# Patient Record
Sex: Male | Born: 1935 | Race: White | Hispanic: No | Smoking: Former smoker
Health system: Southern US, Community
[De-identification: ages and names within clinical notes are randomized; demographics above are authoritative.]

## PROBLEM LIST (undated history)

## (undated) DIAGNOSIS — Z8709 Personal history of other diseases of the respiratory system: Secondary | ICD-10-CM

## (undated) DIAGNOSIS — K625 Hemorrhage of anus and rectum: Secondary | ICD-10-CM

## (undated) DIAGNOSIS — E039 Hypothyroidism, unspecified: Secondary | ICD-10-CM

## (undated) DIAGNOSIS — G459 Transient cerebral ischemic attack, unspecified: Secondary | ICD-10-CM

## (undated) DIAGNOSIS — G5600 Carpal tunnel syndrome, unspecified upper limb: Secondary | ICD-10-CM

## (undated) DIAGNOSIS — F32A Depression, unspecified: Secondary | ICD-10-CM

## (undated) DIAGNOSIS — K4090 Unilateral inguinal hernia, without obstruction or gangrene, not specified as recurrent: Secondary | ICD-10-CM

## (undated) DIAGNOSIS — I2510 Atherosclerotic heart disease of native coronary artery without angina pectoris: Secondary | ICD-10-CM

## (undated) DIAGNOSIS — I10 Essential (primary) hypertension: Secondary | ICD-10-CM

## (undated) DIAGNOSIS — K219 Gastro-esophageal reflux disease without esophagitis: Secondary | ICD-10-CM

## (undated) DIAGNOSIS — F0390 Unspecified dementia without behavioral disturbance: Secondary | ICD-10-CM

## (undated) DIAGNOSIS — E783 Hyperchylomicronemia: Secondary | ICD-10-CM

## (undated) DIAGNOSIS — J189 Pneumonia, unspecified organism: Secondary | ICD-10-CM

## (undated) DIAGNOSIS — Z8719 Personal history of other diseases of the digestive system: Secondary | ICD-10-CM

## (undated) DIAGNOSIS — M1990 Unspecified osteoarthritis, unspecified site: Secondary | ICD-10-CM

## (undated) DIAGNOSIS — G5793 Unspecified mononeuropathy of bilateral lower limbs: Secondary | ICD-10-CM

## (undated) DIAGNOSIS — IMO0001 Reserved for inherently not codable concepts without codable children: Secondary | ICD-10-CM

## (undated) DIAGNOSIS — F329 Major depressive disorder, single episode, unspecified: Secondary | ICD-10-CM

## (undated) DIAGNOSIS — D649 Anemia, unspecified: Secondary | ICD-10-CM

## (undated) DIAGNOSIS — N289 Disorder of kidney and ureter, unspecified: Secondary | ICD-10-CM

## (undated) HISTORY — DX: Hyperchylomicronemia: E78.3

## (undated) HISTORY — DX: Gastro-esophageal reflux disease without esophagitis: K21.9

## (undated) HISTORY — DX: Carpal tunnel syndrome, unspecified upper limb: G56.00

## (undated) HISTORY — DX: Essential (primary) hypertension: I10

## (undated) HISTORY — DX: Anemia, unspecified: D64.9

## (undated) HISTORY — PX: CORONARY ANGIOPLASTY: SHX604

## (undated) HISTORY — PX: COLONOSCOPY: SHX174

## (undated) HISTORY — DX: Atherosclerotic heart disease of native coronary artery without angina pectoris: I25.10

## (undated) HISTORY — DX: Hemorrhage of anus and rectum: K62.5

## (undated) HISTORY — DX: Disorder of kidney and ureter, unspecified: N28.9

## (undated) HISTORY — PX: UPPER GASTROINTESTINAL ENDOSCOPY: SHX188

## (undated) HISTORY — PX: OTHER SURGICAL HISTORY: SHX169

---

## 1963-01-22 HISTORY — PX: OTHER SURGICAL HISTORY: SHX169

## 1997-09-29 ENCOUNTER — Ambulatory Visit (HOSPITAL_COMMUNITY): Admission: RE | Admit: 1997-09-29 | Discharge: 1997-09-29 | Payer: Self-pay | Admitting: Pulmonary Disease

## 1998-05-15 ENCOUNTER — Encounter: Payer: Self-pay | Admitting: Neurological Surgery

## 1998-05-15 ENCOUNTER — Ambulatory Visit (HOSPITAL_COMMUNITY): Admission: RE | Admit: 1998-05-15 | Discharge: 1998-05-15 | Payer: Self-pay | Admitting: Neurological Surgery

## 1999-08-16 ENCOUNTER — Ambulatory Visit (HOSPITAL_COMMUNITY): Admission: RE | Admit: 1999-08-16 | Discharge: 1999-08-16 | Payer: Self-pay | Admitting: Urology

## 2002-03-19 ENCOUNTER — Ambulatory Visit (HOSPITAL_COMMUNITY): Admission: RE | Admit: 2002-03-19 | Discharge: 2002-03-19 | Payer: Self-pay | Admitting: Internal Medicine

## 2003-03-18 ENCOUNTER — Ambulatory Visit (HOSPITAL_COMMUNITY): Admission: RE | Admit: 2003-03-18 | Discharge: 2003-03-18 | Payer: Self-pay | Admitting: Internal Medicine

## 2004-01-22 HISTORY — PX: OTHER SURGICAL HISTORY: SHX169

## 2004-06-19 ENCOUNTER — Inpatient Hospital Stay (HOSPITAL_COMMUNITY): Admission: EM | Admit: 2004-06-19 | Discharge: 2004-06-20 | Payer: Self-pay | Admitting: Emergency Medicine

## 2004-06-19 ENCOUNTER — Ambulatory Visit: Payer: Self-pay | Admitting: Cardiology

## 2004-06-20 ENCOUNTER — Encounter (INDEPENDENT_AMBULATORY_CARE_PROVIDER_SITE_OTHER): Payer: Self-pay | Admitting: *Deleted

## 2004-07-05 ENCOUNTER — Ambulatory Visit: Payer: Self-pay | Admitting: Internal Medicine

## 2004-08-16 ENCOUNTER — Encounter (HOSPITAL_COMMUNITY): Admission: RE | Admit: 2004-08-16 | Discharge: 2004-11-14 | Payer: Self-pay | Admitting: Cardiology

## 2004-09-17 ENCOUNTER — Ambulatory Visit: Payer: Self-pay | Admitting: Cardiology

## 2004-09-28 ENCOUNTER — Emergency Department (HOSPITAL_COMMUNITY): Admission: EM | Admit: 2004-09-28 | Discharge: 2004-09-28 | Payer: Self-pay | Admitting: Emergency Medicine

## 2004-11-15 ENCOUNTER — Encounter (HOSPITAL_COMMUNITY): Admission: RE | Admit: 2004-11-15 | Discharge: 2004-12-20 | Payer: Self-pay | Admitting: Cardiology

## 2005-05-23 ENCOUNTER — Ambulatory Visit: Payer: Self-pay | Admitting: Cardiology

## 2006-04-03 ENCOUNTER — Ambulatory Visit: Payer: Self-pay | Admitting: Cardiology

## 2006-04-10 ENCOUNTER — Ambulatory Visit: Payer: Self-pay

## 2006-04-14 ENCOUNTER — Ambulatory Visit: Payer: Self-pay

## 2006-04-14 ENCOUNTER — Encounter: Payer: Self-pay | Admitting: Internal Medicine

## 2006-04-30 ENCOUNTER — Ambulatory Visit: Payer: Self-pay | Admitting: Cardiology

## 2006-10-28 ENCOUNTER — Encounter (HOSPITAL_COMMUNITY): Admission: RE | Admit: 2006-10-28 | Discharge: 2007-01-20 | Payer: Self-pay | Admitting: Internal Medicine

## 2007-06-02 ENCOUNTER — Ambulatory Visit: Payer: Self-pay | Admitting: Internal Medicine

## 2007-11-25 DIAGNOSIS — K219 Gastro-esophageal reflux disease without esophagitis: Secondary | ICD-10-CM | POA: Insufficient documentation

## 2007-11-25 DIAGNOSIS — I2510 Atherosclerotic heart disease of native coronary artery without angina pectoris: Secondary | ICD-10-CM | POA: Insufficient documentation

## 2007-11-25 DIAGNOSIS — R05 Cough: Secondary | ICD-10-CM

## 2007-11-25 DIAGNOSIS — G5600 Carpal tunnel syndrome, unspecified upper limb: Secondary | ICD-10-CM

## 2007-11-25 DIAGNOSIS — I10 Essential (primary) hypertension: Secondary | ICD-10-CM | POA: Insufficient documentation

## 2007-11-25 DIAGNOSIS — E785 Hyperlipidemia, unspecified: Secondary | ICD-10-CM | POA: Insufficient documentation

## 2007-11-25 DIAGNOSIS — R059 Cough, unspecified: Secondary | ICD-10-CM | POA: Insufficient documentation

## 2007-11-30 ENCOUNTER — Ambulatory Visit: Payer: Self-pay | Admitting: Internal Medicine

## 2007-11-30 DIAGNOSIS — R142 Eructation: Secondary | ICD-10-CM

## 2007-11-30 DIAGNOSIS — R195 Other fecal abnormalities: Secondary | ICD-10-CM | POA: Insufficient documentation

## 2007-11-30 DIAGNOSIS — K625 Hemorrhage of anus and rectum: Secondary | ICD-10-CM

## 2007-11-30 DIAGNOSIS — R143 Flatulence: Secondary | ICD-10-CM

## 2007-11-30 DIAGNOSIS — R141 Gas pain: Secondary | ICD-10-CM | POA: Insufficient documentation

## 2007-11-30 DIAGNOSIS — D649 Anemia, unspecified: Secondary | ICD-10-CM | POA: Insufficient documentation

## 2007-12-02 LAB — CONVERTED CEMR LAB
Ferritin: 55.9 ng/mL (ref 22.0–322.0)
Folate: 18.5 ng/mL
Iron: 132 ug/dL (ref 42–165)
Saturation Ratios: 29.5 % (ref 20.0–50.0)
Transferrin: 319.9 mg/dL (ref >=212.0)
Vitamin B-12: 483 pg/mL (ref 211–911)

## 2007-12-16 ENCOUNTER — Ambulatory Visit: Payer: Self-pay | Admitting: Internal Medicine

## 2007-12-22 ENCOUNTER — Ambulatory Visit: Payer: Self-pay | Admitting: Internal Medicine

## 2007-12-22 ENCOUNTER — Inpatient Hospital Stay (HOSPITAL_BASED_OUTPATIENT_CLINIC_OR_DEPARTMENT_OTHER): Admission: RE | Admit: 2007-12-22 | Discharge: 2007-12-22 | Payer: Self-pay | Admitting: Internal Medicine

## 2008-01-13 ENCOUNTER — Ambulatory Visit: Payer: Self-pay | Admitting: Internal Medicine

## 2008-04-19 DIAGNOSIS — N289 Disorder of kidney and ureter, unspecified: Secondary | ICD-10-CM

## 2008-09-13 ENCOUNTER — Ambulatory Visit: Payer: Self-pay | Admitting: Cardiovascular Disease

## 2008-09-13 ENCOUNTER — Observation Stay: Payer: Federal, State, Local not specified - PPO | Admitting: *Deleted

## 2008-09-14 ENCOUNTER — Encounter: Payer: Self-pay | Admitting: Cardiovascular Disease

## 2008-09-21 ENCOUNTER — Ambulatory Visit: Payer: Self-pay | Admitting: Internal Medicine

## 2008-09-21 DIAGNOSIS — I2510 Atherosclerotic heart disease of native coronary artery without angina pectoris: Secondary | ICD-10-CM | POA: Insufficient documentation

## 2008-10-20 ENCOUNTER — Ambulatory Visit: Payer: Self-pay | Admitting: Internal Medicine

## 2009-04-17 ENCOUNTER — Ambulatory Visit: Payer: Self-pay | Admitting: Internal Medicine

## 2009-04-17 DIAGNOSIS — F329 Major depressive disorder, single episode, unspecified: Secondary | ICD-10-CM

## 2010-01-09 DIAGNOSIS — G4700 Insomnia, unspecified: Secondary | ICD-10-CM | POA: Insufficient documentation

## 2010-01-09 DIAGNOSIS — F419 Anxiety disorder, unspecified: Secondary | ICD-10-CM | POA: Insufficient documentation

## 2010-02-20 NOTE — Assessment & Plan Note (Signed)
Summary: F6M/AMD  Medications Added ASPIRIN 81 MG TBEC (ASPIRIN) Take one tablet by mouth daily NITROSTAT 0.4 MG SUBL (NITROGLYCERIN) 1 tablet under tongue at onset of chest pain; you may repeat every 5 minutes for up to 3 doses. * VITAMIN D-3 take one tablet daily MELATONIN 300 MCG TABS (MELATONIN) take one tablet daily CYMBALTA 30 MG CPEP (DULOXETINE HCL) one by mouth qd      Allergies Added:   Visit Type:  Follow-up Primary Provider:  [name], MD  CC:  no complaints.  History of Present Illness: Shawn Sanchez is a very pleasant 77 year old former [employer] with a history of coronary artery disease.  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  He underwent placement of Cypher drug-eluting stent to the distal right coronary artery and the mid LAD.  At that time, ejection fraction was normal. Cath 12/09 with nonobstructive CAD and patent stents.  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  Doing well from a cardiac standpoint but struggling with recurrent depression. NO CP or SOB. Recently had teeth pulled and struggling with his lower dentures.  Recent lipids with LDL 57 and HDL 40. Previously tried Paxil at [place] and he didn't like because it made him feel fatigued. Wants something more activating.   Current Medications (verified): 1)  Plavix 75 Mg Tabs (Clopidogrel Bisulfate) .Marland Kitchen.. 1 Tablet By Mouth Once Daily 2)  Aspirin Ec 81 Mg Tbec (Aspirin) .Marland Kitchen.. 1 Tablet By Mouth Once Daily 3)  Carvedilol 12.5 Mg Tabs (Carvedilol) .... Take One Tablet By Mouth Twice A Day 4)  Tricor 145 Mg Tabs (Fenofibrate) .Marland Kitchen.. 1 Tablet By Mouth Once Daily 5)  Simvastatin 20 Mg Tabs (Simvastatin) .Marland Kitchen.. 1 Tablet By Mouth Once Daily 6)  Imdur 60 Mg Xr24h-Tab (Isosorbide Mononitrate) .Marland Kitchen.. 1 Tablet By Mouth Once Daily 7)  Naproxen 500 Mg Tabs (Naproxen) .... As Needed 8)  Aspirin 81 Mg Tbec (Aspirin) .... Take One Tablet By Mouth Daily 9)  Nitrostat 0.4 Mg  Subl (Nitroglycerin) .Marland Kitchen.. 1 Tablet Under Tongue At Onset of Chest Pain; You May Repeat Every 5 Minutes For Up To 3 Doses. 10)  Vitamin D-3 .... Take One Tablet Daily 11)  Melatonin 300 Mcg Tabs (Melatonin) .... Take One Tablet Daily  Allergies (verified): 1)  ! * Isocaine 2)  ! * Mepivacaine 3)  ! Allene Pyo  Past History:  Past Medical History: Reviewed history from 04/19/2008 and no changes required. RENAL DISEASE, CHRONIC (ICD-593.9) ANEMIA-UNSPECIFIED (ICD-285.9) FLATULENCE (ICD-787.3) RECTAL BLEEDING (ICD-569.3) FECAL OCCULT BLOOD (ICD-792.1) HYPERTENSION (ICD-401.9) COUGH, CHRONIC (ICD-786.2) CARPAL TUNNEL SYNDROME (ICD-354.0) CAD (ICD-414.00) HYPERLIPIDEMIA (ICD-272.4) GERD (ICD-530.81)    Review of Systems       As per HPI and past medical history; otherwise all systems negative.   Vital Signs:  Patient profile:   77 year old male Height:      68 inches Weight:      172.8 pounds Pulse rate:   64 / minute BP sitting:   114 / 68  (left arm) Cuff size:   regular  Vitals Entered By: Burnett Kanaris, CNA (April 17, 2009 11:12 AM)  Physical Exam  General:  GENERAL:  He is no acute distress, ambulates around the clinic without any respiratory difficulty. HEENT:  Normal except for poor dentition. NECK:  Supple.  No JVD.  Carotids are 2+ bilaterally without any bruits. There is no lymphadenopathy or thyromegaly. CARDIAC:  PMI is nondisplaced.  He has distant heart  sounds.  He is regular with an S4.  No murmur. LUNGS:  Clear. ABDOMEN:  Soft, nontender, nondistended.  No hepatosplenomegaly, no bruits, no masses.  Good bowel sounds.  EXTREMITIES:  Warm with no cyanosis, clubbing, or edema.  No rash. NEURO:  Alert and x3.  Cranial nerves II through XII are intact.  Moves all 4 extremities without difficulty.    Impression & Recommendations:  Problem # 1:  CAD, NATIVE VESSEL (ICD-414.01) Stable. No evidence of ischemia. Continue current regimen.  Problem # 2:   HYPERTENSION (ICD-401.9) Blood pressure well controlled. Continue current regimen.  Problem # 3:  DEPRESSION (ICD-311) Spoke with Dr. Timothy Lasso who recommended Cymbalta 30mg . Will give him prescription and he will f/u with Dr. Timothy Lasso in 2-4 weeks.  Problem # 4:  HYPERLIPIDEMIA (ICD-272.4) Followed by Dr. Timothy Lasso. Lipids look great. Continue current regimen.   Patient Instructions: 1)  Your physician recommends that you schedule a follow-up appointment in: 6 months with Dr Gala Romney 2)  Your physician has recommended you make the following change in your medication: start Cymbalta 30mg  daily

## 2010-05-18 ENCOUNTER — Other Ambulatory Visit: Payer: Self-pay | Admitting: Internal Medicine

## 2010-06-05 NOTE — Assessment & Plan Note (Signed)
[facility]                            CARDIOLOGY OFFICE NOTE   TOBIAH, CELESTINE                     MRN:          [id number]  DATE:06/02/2007                            DOB:          1931/11/28    PRIMARY CARE PHYSICIAN:  Gwen Pounds, M.D.   INTERVAL HISTORY:  Mr. Rumery is a 77 year old man who has a history  of coronary artery disease.  He is status post aborted inferior  myocardial infarction in May of 2006.  He underwent placement of Cypher  drug-eluting stent to his distal right coronary artery and mid LAD back  at that time by Dr. Samule Ohm.  His EF was normal.   He also has a history of hypertension and hyperlipidemia.  He has been  unwilling to take statins, as he has felt it makes him feel weak and has  problems with his memory.  He notes that over the past year when he  exerts himself, when he is walking up a hill, he develops chest pain.  This resolves as soon as he starts going back on flat ground again.  He  has not had any resting symptoms.  He feels like it has been a little  bit more frequent over the past few months, but he has not had any  severe episodes.  He does not take nitroglycerin, as he says the  intensity has never been bad enough.   CURRENT MEDICATIONS:  1. Aspirin 81.  2. Plavix 75.  3. Coreg 12.5 daily.  He is unable to take it b.i.d.  4. Tricor 145 a day.   PHYSICAL EXAMINATION:  GENERAL:  He is well-appearing, in no acute  distress.  He ambulates around the clinic without any respiratory  difficulty.  VITAL SIGNS:  Blood pressure is 110/70, heart rate 58, weights 180.  HEENT:  Normal.  NECK:  Supple.  No JVD.  Carotids are 2+ bilateral without bruits.  There is no lymphadenopathy or thyromegaly.  CARDIAC:  PMI is nondisplaced.  Regular rate and bradycardic.  No  murmurs, rubs or gallops.  No murmurs, rubs or gallops.  LUNGS:  Clear.  ABDOMEN:  Soft, nontender, nondistended.  No hepatosplenomegaly, no  bruits.  Good bowel sounds.  EXTREMITIES:  Warm with no cyanosis, clubbing or edema.  No rash.  NEUROLOGIC:  Alert and oriented x3.  Cranial nerves II-XII are intact.  Moves all four extremities without difficulty.  Affect is pleasant.   EKG shows sinus bradycardia at a rate of 58, no ST-T wave abnormalities.   LABORATORY DATA:  Lipid panel shows total cholesterol of 188,  triglycerides 118, LDL of 118, and HDL 46.   ASSESSMENT AND PLAN:  1. Coronary artery disease.  He is status post previous stenting.      Symptoms are very consistent with chronic stable angina.  We      reviewed the options including medical therapy or repeat      catheterization.  At this point, he would like to pursue medical      therapy.  I have suggested cardiac rehab, but he would like  to work      out at the exercise facility near [place] [employer] [place].  At told him      this is okay as long as he is not pushing too hard with chest pain.      Will start him on Imdur 30 mg a day.  2. Hyperlipidemia.  This is followed by Dr. Timothy Lasso.  I had a long talk      with Mr. Benincasa about the need to get his LDL under 70 and the      importance of statins with a 30-40% reduction in recurrent cardiac      events.  He has agreed to try simvastatin 20 mg a day.  3. Hypertension, well-controlled.   DISPOSITION:  I will see him back in 2-3 months for followup.  I told  him that if he continuing to have significant chest pain, we can we  rediscuss the issue of catheterization at that time.  He knows to call  me if there is a major problem.  He does have p.r.n. nitroglycerin.     Bevelyn Buckles. Bensimhon, MD  Electronically Signed    DRB/MedQ  DD: 06/02/2007  DT: 06/02/2007  Job #: [id number]   cc:   Gwen Pounds, MD

## 2010-06-05 NOTE — Assessment & Plan Note (Signed)
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  He is status  post aborted inferior wall myocardial infarction in May 2006.  He  underwent placement of Cypher drug-eluting stent to the distal right  coronary artery and the mid LAD.  At that time, ejection fraction was  normal.  He also has a history of hypertension and hyperlipidemia,  though he has been unwilling to take statins due to side effects.   I saw him last month and he was having progressive shortness of breath  and neck pain which was his anginal equivalent.  We proceeded with  cardiac catheterization.  This showed nonobstructive coronary artery  disease with patent stents.  His EDP was only 6.  His ejection fraction  was normal.  He returns today for followup.  Overall, he says he is  feeling a bit better.  He still has some shortness of breath, but no  further neck pain.   CURRENT MEDICATIONS:  1. Aspirin 81.  2. Plavix 75 a day.  3. Policosanol.  4. TriCor 145 a day.  5. Carvedilol 6.250 b.i.d.  6. Imdur 30 a day.  7. Simvastatin 20 a day.  8. Synthroid 50 mcg a day.   PHYSICAL EXAMINATION:  GENERAL:  He is no acute distress, ambulates  around the clinic without any respiratory difficulty.  VITAL SIGNS:  Blood pressure is 110/62, heart rate 75, weight is 178.  HEENT:  Normal except for poor dentition.  NECK:  Supple.  No JVD.  Carotids are 2+ bilaterally without any bruits.  There is no lymphadenopathy or thyromegaly.  CARDIAC:  PMI is nondisplaced.  He has distant heart sounds.  He is  regular with an S4.  No murmur.  LUNGS:  Clear.  ABDOMEN:  Soft,  nontender, nondistended.  No hepatosplenomegaly, no  bruits, no masses.  Good bowel sounds.  EXTREMITIES:  Warm with no  cyanosis, clubbing, or edema.  No rash.  NEURO:  Alert and x3.  Cranial nerves II through XII are intact.  Moves  all 4 extremities without difficulty.   ASSESSMENT AND PLAN:  1. Coronary artery disease.  Catheterization is quite reassuring.  We      will continue medical therapy.  I did tell that he could      discontinue his Plavix for his colonoscopy or any possible dental      procedure he has coming up.  I told him that there was a small risk      of late stent thrombosis after coming off of Plavix, but this      should be quite small.  2. Hypertension well-controlled.  3. Hyperlipidemia.  He is finally agreed to take a statin.  Goal LDL  is less than 70.  This is followed by Dr. Timothy Lasso.  4. Deconditioning.  I suggested that he participate in a routine      exercise program.  We have given him a note that he can use the      exercise facility [place].     Bevelyn Buckles. Bensimhon, MD  Electronically Signed    DRB/MedQ  DD: 01/13/2008  DT: 01/13/2008  Job #: [id number]

## 2010-06-05 NOTE — Assessment & Plan Note (Signed)
[facility]                            CARDIOLOGY OFFICE NOTE   GER, RINGENBERG                     MRN:          [id number]  DATE:12/16/2007                            DOB:          May 19, 1931    PRIMARY CARE PHYSICIAN:  Shawn Pounds, MD   INTERVAL HISTORY:  Shawn Sanchez is a delightful 77 year old male with a history  of coronary artery disease.  He has status post aborted inferior wall  myocardial infarction in May 2006.  He underwent placement of Cypher  drug-eluting stent to his distal right coronary artery and the mid LAD  back at that time.  His ejection fraction was normal.  He also has a  history of hypertension and hyperlipidemia.  He has been unwilling to  take statins due to making him feel weak and he worries that they causes  problems with his memory.  He has been having some problems with just  minimal rectal bleeding, which he thinks it is due to fissures.  He did  see Dr. Yancey Flemings, GI, who is considering endoscopy, but they wanted  him to have a cardiac workup first.   At his last visit back in May, he was having symptoms that were  concerned for exertional angina.  He states these has now gotten worse.  He develops shortness of breath and neck pain with almost any activity  particularly up steps or whenever he pushes himself.  It has become more  frequent.  Last week, he did have an episode, where he developed neck  pain at rest.  He took 1 nitroglycerin and resolved spontaneously.   CURRENT MEDICATIONS:  1. Aspirin 81 a day.  2. Plavix 75 a day.  3. TriCor 145 a day.  4. Carvedilol 6.25 b.i.d.  5. Imdur 30 a day.  6. Simvastatin 20 a day.  7. Synthroid 50 mcg a day.   PHYSICAL EXAMINATION:  GENERAL:  He is well-appearing in no acute  distress, ambulates around the clinic without any respiratory  difficulty.  VITAL SIGNS:  Blood pressure is 112/68, heart rate 58, and weight is  179.  HEENT:  Normal.  NECK:  Supple.  No  JVD.  Carotids are 2+ bilaterally without any bruits.  There is no lymphadenopathy or thyromegaly.  CARDIAC:  PMI is nondisplaced.  Regular rate and rhythm.  No murmurs,  rubs, or gallops.  LUNGS:  Clear.  ABDOMEN:  Soft, nontender, and nondistended.  No hepatosplenomegaly.  No  bruits.  No masses.  Good bowel sounds.  EXTREMITIES:  Warm with no cyanosis, clubbing, or edema.  No rash.  NEURO:  Alert and oriented x3.  Cranial nerves II through XII are  intact.  Moves all 4 extremities without difficulty.  Affect is  pleasant.   EKG shows sinus bradycardia at a rate of 58.  No ST-T wave  abnormalities.   ASSESSMENT AND PLAN:  1. Coronary artery disease.  He is having symptoms of very concerning      for progressive angina.  We will plan a cardiac catheterization      next week in  the outpatient lab.  We will continue his Plavix.  I      did tell should he have a recurrent pain, which is refractory to a      single nitroglycerin, he should call 911 and be admitted to the      hospital for evaluation.  He understands this.  We will proceed      with catheterization next week.  2. Hypertension.  Blood pressure is well controlled.  3. Hyperlipidemia.  Lipids are followed by Dr. Timothy Lasso.  Goal LDL is      less than 70.     Bevelyn Buckles. Bensimhon, MD  Electronically Signed    DRB/MedQ  DD: 12/16/2007  DT: 12/16/2007  Job #: [id number]   cc:   Shawn Pounds, MD

## 2010-06-05 NOTE — Cardiovascular Report (Signed)
NAMEEVERARD, INTERRANTE              ACCOUNT NO.:  [id number]   MEDICAL RECORD NO.:  [id number]          PATIENT TYPE:  OIB   LOCATION:  1963                         FACILITY:  [facility]   PHYSICIAN:  Bevelyn Buckles. Bensimhon, MDDATE OF BIRTH:  [birth year]   DATE OF PROCEDURE:  12/22/2007  DATE OF DISCHARGE:                            CARDIAC CATHETERIZATION   PRIMARY CARE PHYSICIAN:  Gwen Pounds, MD   INTERVAL HISTORY:  Mr. Mossberg is a very pleasant 77 year old male with  history of coronary artery disease status post drug-eluting stent  placement to the distal right coronary artery and the mid LAD in May  2006.  He presented to the office with progressive chest pain concerning  for progressive angina.  He is brought today for diagnostic angiography.   PROCEDURES PERFORMED:  1. Selective coronary angiography.  2. Left heart catheterization.  3. Left ventriculogram.   DESCRIPTION OF PROCEDURE:  The risks and indication of the  catheterization were explained.  Consent was signed and placed on the  chart.  A 4-French arterial sheath was placed in the right femoral  artery using a modified Seldinger technique.  Standard catheters  including a JL-4, 3DRC and angled pigtail were used for the procedure.  All catheter exchanges were made over wire.  There were no apparent  complications.   Central aortic pressure 102/51 with a mean of 73.  LV pressure 101/9  with EDP of 6.  There was no aortic stenosis.   Left main was normal.   LAD was a long vessel coursing to the apex that gave off a single large  diagonal branch in the proximal section.  In the midsection, there was a  previously placed stent.  Prior to the stent, there was a 40-50% tubular  lesion and the stent was widely patent.   Left circumflex gave off a large ramus branch, small OM-1 and a moderate-  sized posterolateral branch.  There was a 50-60% lesion in the  midsection of the ramus branch.  This was non-flow limiting.   Right coronary artery was a large dominant vessel gave off a PDA and two  posterolaterals.  There was a 40% tubular lesion in the proximal RCA and  30% mid-lesion.  There was a long stent in the distal section which was  widely patent.  There was minor luminal irregularities distally.   Left ventriculogram in the RAO position showed an EF of 60-65% with no  regional wall motion abnormalities.  On panning down over the abdominal  aorta, there was no evidence of abdominal aortic aneurysm.   ASSESSMENT:  1. Nonobstructive coronary artery disease.  2. Patent right coronary artery and left anterior descending stents.  3. Normal left ventricular function.   PLAN/DISCUSSION:  I am unclear to the etiology of Mr. Zeck chest  pain.  Based on his angiography, it does not appear to be significant  myocardial ischemia.  We will check a D-dimer.  We will continue medical  therapy at this point.  If pain persists, we may consider GI workup.      Bevelyn Buckles. Bensimhon, MD  Electronically Signed  DRB/MEDQ  D:  12/22/2007  T:  12/22/2007  Job:  [id number]

## 2010-06-08 NOTE — Discharge Summary (Signed)
[name], [name] NO.:  [id number]   MEDICAL RECORD NO.:  [id number]          PATIENT TYPE:  INP   LOCATION:  6529                         FACILITY:  [facility]   PHYSICIAN:  Willa Rough, M.D.     DATE OF BIRTH:  [birth year]   DATE OF ADMISSION:  06/19/2004  DATE OF DISCHARGE:  06/20/2004                                 DISCHARGE SUMMARY   PRINCIPAL DIAGNOSIS:  Inferior non-ST elevation myocardial  infarction/coronary artery disease.   OTHER DIAGNOSES:  1. Gastroesophageal reflux disease.  2. Seasonal allergies.  3. Bronchitis.  4. Hyperlipidemia.  5. History of back injury in 1965.     PROCEDURES:  Left heart cardiac catheterization with percutaneous coronary  intervention and stenting of the right coronary artery and left anterior  descending.   ALLERGIES:  No known drug allergies.   HISTORY OF PRESENT ILLNESS:  77 year old white male with no prior history of  coronary artery disease who had at least one week history of intermittent  anterior chest discomfort which has progressed over the past week.  On Jun 19, 2004 he had an episode after eating lunch that developed with rest and  was described as squeezing.  He presented to his primary care physician's  office approximately 2:35 P.M. where electrocardiogram showed sinus rhythm  with non-specific ST changes.  He was treated with sublingual nitroglycerin  and aspirin and was taken by ambulance to [facility] emergency department.   HOSPITAL COURSE:  Following arrival the patient was taken urgently to the  cardiac catheterization laboratory.  He has had some recurrent chest  discomfort with 1 mm ST segment elevation in lead III only.  Catheterization  revealed normal left main, 70% lesion in the mid left anterior descending,  normal left circumflex, a 99% lesion in the distal right coronary artery  The distal right coronary artery was stented with 3.0 by 33 mm Cypher drug-  eluting stent while the mid  left anterior descending was stented with a 2.5  by 23 mm Cypher drug-eluting stent.  Post procedure the patient was  monitored on telemetry and did not have any ectopy.  His post procedure CK  was 113 with an MB of 2.4 and troponin-I of 0.12.  This morning the patient  has been ambulating in the hallways without discomfort or recurrent  symptoms.  It was noted on his chemistry-7 that his glucose was 138.  He  does not have a prior diagnosis of diabetes and hemoglobin A1C was sent and  is pending.  This will be followed up by his internal medicine physician.  The patient is being discharged home today in satisfactory condition.   LABORATORY DATA:  Discharge labs include hemoglobin 11.3, hematocrit 32.8,  white blood cell count 7.1, platelet count 195,000. Sodium 140, potassium  3.5, chloride 107, cO2 27, BUN 10, creatinine 1.2, glucose 138.  CK 113, MB  2.4, troponin-I 0.12, calcium 8.4.   DISPOSITION:  Patient is being discharged home in good condition.   FOLLOW UP PLANS AND APPOINTMENTS:  The patient is asked to follow up with  Dr. Timothy Lasso,  his primary care physician in one to two weeks at which time he  will have follow up blood glucose.  He has an appointment with Dr. Samule Ohm at  [facility] on July 05, 2004 at 11:00 A.Shawn Sanchez MEDICATIONS:  1. Aspirin 81 mg daily.  2. Plavix 75 mg daily.  3. Lopressor 25 mg b.i.d.  4. Lipitor 80 mg q.h.s.  5. Nitroglycerin 0.4 mg sublingual PRN chest pain.     PENDING LABORATORY DATA:  Hemoglobin A1C is pending assessment.   Duration discharge encounter 40 minutes.      CRB/MEDQ  D:  06/20/2004  T:  06/20/2004  Job:  [id number]

## 2010-06-08 NOTE — H&P (Signed)
Shawn Sanchez, Shawn NO.:  [id number]   MEDICAL RECORD NO.:  [id number]          PATIENT TYPE:  INP   LOCATION:  6529                         FACILITY:  [facility]   PHYSICIAN:  Willa Rough, M.D.     DATE OF BIRTH:  15-Jul-1931   DATE OF ADMISSION:  06/19/2004  DATE OF DISCHARGE:                                HISTORY & PHYSICAL   PRIMARY CARE PHYSICIAN:  Gwen Pounds, M.D.   BRIEF HISTORY:  [name] is a 77 year old white male who was transported  via EMS from his primary care physician's office to [facility] Emergency  Room secondary to chest discomfort. [name] describes at least a week  history of intermittent anterior chest discomfort which he described as  indigestion associated with exertion and relieved with rest, although he  does recall one episode that occurred at night but he cannot be sure if it  actually woke him from sleep. He is unsure of the duration of these episodes  or the frequency. He is attributed these episodes to stress. Today at  approximately 11:45, while having lunch with some [employer] friends, he  developed similar symptoms which he describes a squeezing sensation. He  returned home and tried calling his primary care physician and went to their  office around 2 o'clock when they reopened after lunch. He was seen at  approximately 1435 for chest discomfort. His initial EKG in the office did  not show any acute changes and he received some aspirin and sublingual  nitroglycerin. With the symptoms of chest discomfort, he was referred to  [facility] Room for further evaluation. During transport via EMS,  his discomfort increased. An EKG by EMS did show some pronounced T-wave  inversion and aVL. He received more sublingual nitroglycerin.   On presentation to the emergency room, the discomfort has subsided quite a  bit according to the patient. However, in the emergency room, a few minutes  later his discomfort increased  from a one to a five. When his discomfort was  a one he did not have any acute EKG changes. When his discomfort became a  five, he had marked T-wave inversion in aVL and some slight ST-segment  elevation in leads III and aVF. With his acute changes and ongoing  intermittent chest discomfort, it is felt that he should undergo emergent  cardiac catheterization.   ALLERGIES:  No known drug allergies.   MEDICATIONS:  Prior to his hospitalization included Prilosec 20 milligrams  q.d. and some type of prescription for seasonal allergies.   PAST MEDICAL HISTORY:  1. His history is notable for GERD.  2. Seasonal allergies.  3. Bronchitis with a chronic cough.  4. Dyslipidemia which he is trying to control by weight loss.  5. Back injury which required laminectomy in 1965 and bilateral carpal      tunnel.  6. He denies any history of diabetes, hypertension and prior myocardial      infarction, CVA bleeding dyscrasias, thyroid dysfunction.     SOCIAL HISTORY:  He is divorced, retired Civil engineer, contracting since  1988. He  remains very active in the [employer] groups locally. He  quit smoking approximately 10 months ago; prior to that, he smoked three  cigars per week. He denies any history of cigarette use. Alcohol:  He stated  he quit in January 2005. He has never had any problems with withdrawal. He  denies any drug use. He has one daughter who is alive; however, when  discussing his daughter, he initially states that he does not have a  daughter and then he states that he does. Apparently, there are many legal  issues which is causing him an increased amount of stress.   FAMILY HISTORY:  Obtained by his primary care physician's note. Revealed his  father has a history of hypertension, lung cancer and coronary artery  disease. He is deceased at 65. His mother deceased, unknown age of  breast  cancer, lung cancer and osteoarthritis.   PHYSICAL EXAMINATION:  VITAL SIGNS:   Per Dr. Samule Ohm blood pressure 126/69,  pulse 76 and regular, respirations 20, 98% sat on room air.  HEENT:  Unremarkable.  NECK:  Supple without thyromegaly, adenopathy, JVD or carotid bruits.  CHEST:  Symmetrical excursion.  LUNGS:  Clear to auscultation.  HEART:  PMI is not displaced. Regular rate and rhythm. No lifts, heaves, or  thrills. Normal S1-S2. No murmurs, rubs, clicks or gallops.  SKIN:  Integument was intact.  ABDOMEN:  Slightly rounded. Bowel sounds present without organomegaly,  masses or tenderness. EXTREMITIES:  Negative cyanosis, clubbing or edema.  MUSCULOSKELETAL:  Unremarkable.  NEUROLOGIC:  Unremarkable.   LABORATORY DATA:  Chest x-ray pending. EKG as previously. Labs are pending  at the time of this dictation.   IMPRESSION:  Acute coronary syndrome versus stuttering myocardial infarction  with the previously mentioned electrocardiogram changes, increased social  stressors, history of hyperlipidemia, remote tobacco and alcohol use.   DISPOSITION:  [name] has been placed on IV nitroglycerin, heparin in  the emergency room. He will be taken emergently to the catheterization lab  by Dr. Samule Ohm. Labs and chest x-ray will be reviewed when available. He will  be admitted to CCU and treated accordingly post results of his heart  catheterization. Note that this H&P is limited secondary to the urgency of  the situation.      EW/MEDQ  D:  06/19/2004  T:  06/19/2004  Job:  [id number]   cc:   Gwen Pounds, MD  Fax: [phone]

## 2010-06-08 NOTE — Assessment & Plan Note (Signed)
[facility]                            CARDIOLOGY OFFICE NOTE   [name], [name]                     MRN:          [id number]  DATE:04/03/2006                            DOB:          06/06/1931    REFERRING PHYSICIAN:  Gwen Pounds, MD   HISTORY OF PRESENT ILLNESS:  Shawn Sanchez is a 77 year old gentleman who  suffered aborted inferior myocardial infarction in May 2006.  I placed  Cypher drug-eluting stent in the distal right coronary.  I also treated  a severe stenosis of the mid LAD with a drug-eluting stent.  His  ejection fraction is normal.   Over the past 3 or 4 months, Shawn Sanchez has developed recurrent  angina.  He says with moderate levels of exertion he develops throat  tightness like that which was his prior ischemic symptom.  For the most  part, this has occurred with exertion.  However, he has had 3 episodes  of similar discomfort at rest.  The most recent of these episodes was  about 3 weeks ago.  He has not had any exertional dyspnea, PND,  orthopnea, edema, or claudication.   CURRENT MEDICATIONS:  1. Aspirin 81 mg daily.  2. Plavix 75 mg daily.  3. Fish oil 2 gm daily.  4. Vitamin C.  5. Flax seed oil.  6. Coreg CR 10 mg daily.  7. Crestor 10 mg daily.  8. Niacin 250 mg daily.   PHYSICAL EXAMINATION:  He is generally well appearing, in no distress.  His heart rate is 65.  Blood pressure 130/80.  Weight of 174 pounds.  He has no jugular venous distention, thyromegaly, or lymphadenopathy.  LUNGS:  Clear to auscultation.  He has a nondisplaced point of maximal cardiac impulse.  There is a  regular rate and rhythm without murmur, rub, or gallop.  ABDOMEN:  Soft, non-distended, non-tender.  There is no  hepatosplenomegaly.  Bowel sounds are normal.  EXTREMITIES:  Warm without clubbing, cyanosis, edema, or ulceration.  Carotid pulses 2+ bilaterally without bruit.   Electrocardiogram today demonstrates normal sinus  rhythm with frequent  premature atrial contractions.  It is otherwise normal.   IMPRESSION/RECOMMENDATIONS:  1. Recurrent angina:  He had prior aborted inferior myocardial      infarction, and has had drug-eluting stents placed in both the left      anterior descending and right coronary artery.  He should,      therefore, be maintained on both aspirin and Plavix indefinitely.      With his recurrent angina, we will increase his beta blockade, by      switching from Coreg CR to short-acting Carvedilol 12.5 mg twice      daily.  I offered Imdur in addition to his sublingual      nitroglycerin.  He is reluctant to start an additional medication,      despite his symptoms.  I will check exercise Cardiolite performed      with him on the beta blocker early next week, and have him follow      up with me thereafter.  2.  Hypercholesterolemia:  Managed by Dr. Timothy Lasso.  Patient is to see Dr.      Timothy Lasso tomorrow.  3. Glucose intolerance:  Per Dr. Timothy Lasso.  4. I will see him back within the next week or 2 after the stress      test.     Salvadore Farber, MD  Electronically Signed    WED/MedQ  DD: 04/03/2006  DT: 04/04/2006  Job #: [id number]

## 2010-06-08 NOTE — Assessment & Plan Note (Signed)
[facility]                            CARDIOLOGY OFFICE NOTE   ANDREA, Sanchez                     MRN:          [id number]  DATE:04/30/2006                            DOB:          1931/10/21    PRIMARY CARE PHYSICIAN:  Gwen Pounds, M.D.   HISTORY OF PRESENT ILLNESS:  Shawn Sanchez is a 77 year old gentleman,  who suffered aborted inferior myocardial infarction in May 2006.  I  placed a Cypher drug eluting stent in his distal right coronary.  I also  treated a severe stenosis of the mid-LAD with another drug eluting  stent.  His EF is normal.   I saw him three weeks ago, when he complained of throat discomfort with  exertion.  This was his prior ischemic symptom.  We increased his beta  blocker and checked an exercise Cardiolite.  He walked 9 minutes of the  standard Bruce protocol, stopping due to throat tightness.  There was no  evidence of ischemia on electrocardiogram or Cardiolite imaging.  Patient tells me that his symptoms were very minimal on the treadmill.  In the weeks since then, he has had no recurrence of this symptom.   CURRENT MEDICATIONS:  1. Aspirin 81 mg daily.  2. Plavix 75 mg daily.  3. Fish oil 2 g daily.  4. Vitamin C.  5. Flax seed oil.  6. Crestor 10 mg daily.  7. Niacin 250 mg daily.  8. Carvedilol 12.5 mg twice daily.   PHYSICAL EXAMINATION:  He is generally well-appearing, in no distress,  with heart rate 68, blood pressure 98/68, and weight of 176 pounds.  He  has no jugular venous distention, thyromegaly or lymphadenopathy.  Lungs  are clear to auscultation.  He has a nondisplaced point of maximal  cardiac impulse.  There is a regular rate and rhythm without murmur, rub  or gallop.  The abdomen is soft, nondistended, nontender.  There is no  hepatosplenomegaly.  Bowel sounds are normal.  The extremities are warm  without clubbing, cyanosis, edema or ulceration.  Carotid pulses are 2+  bilaterally, without  bruit.   IMPRESSION/RECOMMENDATIONS:  1. Angina pectoris:  Cardiolite result is very reassuring, as is the      resolution of his symptoms with increased beta blockade.  We will      continue with current therapy.  No indication for catheterization,      given his absence of symptoms and absence of ischemia.  2. Prior myocardial infarction:  Continue both aspirin and Plavix      indefinitely, given drug eluting stents in both the RCA and LAD.      Continue beta blocker.  3. Hypercholesterolemia:  Managed by Dr. Timothy Lasso.  4. Glucose intolerance:  Per Dr. Timothy Lasso.  5. We will have him back to follow up with one of my partners in 12      months.     Shawn Farber, MD  Electronically Signed    WED/MedQ  DD: 04/30/2006  DT: 04/30/2006  Job #: [id number]   cc:   Gwen Pounds, MD

## 2010-06-08 NOTE — Cardiovascular Report (Signed)
Shawn Sanchez, Shawn Sanchez              ACCOUNT NO.:  [id number]   MEDICAL RECORD NO.:  [id number]          PATIENT TYPE:  INP   LOCATION:  1832                         FACILITY:  [facility]   PHYSICIAN:  Salvadore Farber, M.D. LHCDATE OF BIRTH:  Aug 08, 1931   DATE OF PROCEDURE:  06/19/2004  DATE OF DISCHARGE:                              CARDIAC CATHETERIZATION   PROCEDURE:  1.  Left heart catheterization.  2.  Left ventriculography.  3.  Coronary angiography.  4.  Drug-eluting stent placement in the distal right coronary artery.  5.  Drug-eluting stent placement in the mid left anterior descending.  6.  StarClose closure of the right common femoral artery arteriotomy site.   INDICATION:  Shawn Sanchez is a 77 year old gentleman without prior history  of cardiovascular disease.  He presents with several days of neck discomfort  occurring with minimal exertion.  Today at noon, he began developing  spontaneous episodes of similar discomfort occurring at rest.  He presented  to Dr.  Ferd Hibbs office and was subsequently referred urgently to the  emergency room.  Over the course of the afternoon, the neck discomfort has  waxed and waned.  Electrocardiogram obtained when he was pain-free is  normal.  Electrocardiogram with pain demonstrates subtle less than 1-mm ST  elevations in lead III.  He was thus felt to have unstable angina with  threatened inferior myocardial infarction and was brought urgently to the  cardiac catheterization lab for angiography with an eye to percutaneous  revascularization.   PROCEDURAL TECHNIQUE:  Informed consent was obtained.  Under 1% lidocaine  local anesthesia, a 5-French sheath was placed in the right common femoral  artery using the modified Seldinger technique.  Diagnostic angiography and  ventriculography were performed using JL4 and JR4 catheters.  Sheath was  then up-sized over a wire to 6-French.   Images demonstrated 99% stenosis of the distal RCA with  TIMI-1 flow to the  distal vasculature.  There was also a 70% stenosis of the mid LAD.  Decision  was made to proceed with percutaneous intervention on both.   Anticoagulation had been initiated with heparin and aspirin in the emergency  room.  Here, 600 mg of Plavix and double-bolus eptifibatide were  administered.  The ACT was maintained at greater than 200 seconds throughout  the case.   Attention was first turned to the RCA.  A 6-French JR4 guide was advanced  over a wire and engaged in the ostium of the RCA.  A Prowater wire was  advanced to the distal vessel without difficulty.  The lesion was predilated  using a 2.5 x 12-mm Maverick at 6 atmospheres.  It was then stented using a  3.0 x 33-mm CYPHER at 16 atmospheres.  The entirety of the stent was then  post-dilated using a 3.25-mm PowerSail for 3 successive inflations at 18  atmospheres.  Final angiography demonstrated no residual stenosis and TIMI-3  flow to the distal vasculature.   Attention was then turned to the LAD lesion.  A CLS 3.5 guide was advanced  over a wire and engaged in the ostium of the  left main.  A Prowater wire was  advanced to the distal LAD without difficulty.  I attempted to directly  stent it, but was unable to pass the stent.  I therefore predilated using a  2.5 x 12-mm Maverick at 6 atmospheres.  I was then able to easily pass the  2.5 x 23-mm CYPHER stent.  This was positioned just distal to the large  diagonal.  It was deployed at 16 atmospheres.  The entirety of the stent was  then post-dilated using a 2.75-mm PowerSail at 18 atmospheres.  Final  angiography demonstrated on residual stenosis, no dissection, and no plaque  shift in to the diagonal.   The arteriotomy site was then closed using a StarClose device after the  administration of 1 g of Ancef.  The patient tolerated the procedure well  and was transferred to the holding room in stable condition.   COMPLICATIONS:  None.   FINDINGS:  1.   LV:  112/7/15.  EF 65% without regional wall motion abnormality.  2.  No aortic stenosis or mitral regurgitation.  3.  Left main:  Angiographically normal.  4.  LAD:  A relatively large vessel giving rise to a single moderate-sized      diagonal.  The mid-vessel had a 70% stenosis stented to no residual.  5.  Ramus intermedius:  A moderate-sized vessel.  There is a 30% ostial      stenosis.  6.  Circumflex:  Moderate-sized vessel giving rise to 2 obtuse marginals.      It has only minor luminal irregularities.  7.  RCA:  Large, dominant vessel.  There was a 99% stenosis of the distal      vessel with associated TIMI-1 flow.  This was stented to no residual      stenosis with establishment of TIMI-3 flow.   IMPRESSION/RECOMMENDATION:  Successful drug-eluting stent placement in both  the culprit lesion of the right coronary artery and the severe stenosis of  the mid left anterior descending.  The patient should be maintained on  Plavix for a minimum of 1 year.  Aspirin will be continued indefinitely.  Beta blocker and high-dose statin will be initiated.      WED/MEDQ  D:  06/19/2004  T:  06/20/2004  Job:  [id number]   cc:   Gwen Pounds, MD  Fax: [phone]

## 2010-06-14 ENCOUNTER — Other Ambulatory Visit: Payer: Self-pay | Admitting: Internal Medicine

## 2010-06-15 ENCOUNTER — Other Ambulatory Visit: Payer: Self-pay | Admitting: *Deleted

## 2010-06-15 ENCOUNTER — Other Ambulatory Visit: Payer: Self-pay | Admitting: Internal Medicine

## 2010-06-15 MED ORDER — ISOSORBIDE MONONITRATE ER 60 MG PO TB24: 60.0000 mg | ORAL_TABLET | Freq: Every day | ORAL | Status: DC

## 2010-06-17 ENCOUNTER — Inpatient Hospital Stay (HOSPITAL_COMMUNITY)
Admission: EM | Admit: 2010-06-17 | Discharge: 2010-06-19 | DRG: 066 | Disposition: A | Discharge status: Home / Self Care | Payer: Medicare Other | Attending: Family Medicine | Admitting: Family Medicine

## 2010-06-17 ENCOUNTER — Emergency Department (HOSPITAL_COMMUNITY): Payer: Medicare Other

## 2010-06-17 ENCOUNTER — Encounter (HOSPITAL_COMMUNITY): Payer: Self-pay | Admitting: Radiology

## 2010-06-17 DIAGNOSIS — Z79899 Other long term (current) drug therapy: Secondary | ICD-10-CM

## 2010-06-17 DIAGNOSIS — Z7982 Long term (current) use of aspirin: Secondary | ICD-10-CM

## 2010-06-17 DIAGNOSIS — I2510 Atherosclerotic heart disease of native coronary artery without angina pectoris: Secondary | ICD-10-CM | POA: Diagnosis present

## 2010-06-17 DIAGNOSIS — Z9861 Coronary angioplasty status: Secondary | ICD-10-CM

## 2010-06-17 DIAGNOSIS — Z7902 Long term (current) use of antithrombotics/antiplatelets: Secondary | ICD-10-CM

## 2010-06-17 DIAGNOSIS — I252 Old myocardial infarction: Secondary | ICD-10-CM

## 2010-06-17 DIAGNOSIS — I6350 Cerebral infarction due to unspecified occlusion or stenosis of unspecified cerebral artery: Principal | ICD-10-CM | POA: Diagnosis present

## 2010-06-17 DIAGNOSIS — I672 Cerebral atherosclerosis: Secondary | ICD-10-CM | POA: Diagnosis present

## 2010-06-17 DIAGNOSIS — E039 Hypothyroidism, unspecified: Secondary | ICD-10-CM | POA: Diagnosis present

## 2010-06-17 DIAGNOSIS — H814 Vertigo of central origin: Secondary | ICD-10-CM

## 2010-06-17 DIAGNOSIS — Z23 Encounter for immunization: Secondary | ICD-10-CM

## 2010-06-17 DIAGNOSIS — E785 Hyperlipidemia, unspecified: Secondary | ICD-10-CM | POA: Diagnosis present

## 2010-06-17 LAB — CBC
MCH: 32.5 pg (ref 26.0–34.0)
MCHC: 35 g/dL (ref 30.0–36.0)
MCV: 92.9 fL (ref 78.0–100.0)
Platelets: 160 10*3/uL (ref 150–400)
RDW: 12.6 % (ref 11.5–15.5)
WBC: 6.4 10*3/uL (ref 4.0–10.5)

## 2010-06-17 LAB — URINALYSIS, ROUTINE W REFLEX MICROSCOPIC
Glucose, UA: NEGATIVE mg/dL
Hgb urine dipstick: NEGATIVE
Ketones, ur: NEGATIVE mg/dL
Protein, ur: NEGATIVE mg/dL

## 2010-06-17 LAB — BASIC METABOLIC PANEL
BUN: 21 mg/dL (ref 6–23)
Calcium: 9.4 mg/dL (ref 8.4–10.5)
Chloride: 106 mEq/L (ref 96–112)
Creatinine, Ser: 1.37 mg/dL (ref 0.4–1.5)

## 2010-06-17 MED ORDER — IOHEXOL 350 MG/ML SOLN
100.0000 mL | Freq: Once | INTRAVENOUS | Status: AC | PRN
  Administered 2010-06-17: 50 mL via INTRAVENOUS

## 2010-06-18 LAB — CBC
HCT: 34.1 % — ABNORMAL LOW (ref 39.0–52.0)
MCH: 31.9 pg (ref 26.0–34.0)
MCHC: 34.3 g/dL (ref 30.0–36.0)
MCV: 92.9 fL (ref 78.0–100.0)
RDW: 12.6 % (ref 11.5–15.5)

## 2010-06-18 LAB — GLUCOSE, CAPILLARY
Glucose-Capillary: 118 mg/dL — ABNORMAL HIGH (ref 70–99)
Glucose-Capillary: 98 mg/dL (ref 70–99)
Glucose-Capillary: 104 mg/dL — ABNORMAL HIGH (ref 70–99)

## 2010-06-18 LAB — COMPREHENSIVE METABOLIC PANEL
ALT: 13 U/L (ref 0–53)
Alkaline Phosphatase: 57 U/L (ref 39–117)
CO2: 27 mEq/L (ref 19–32)
GFR calc non Af Amer: 47 mL/min — ABNORMAL LOW (ref >60)
Glucose, Bld: 94 mg/dL (ref 70–99)
Potassium: 3.6 mEq/L (ref 3.5–5.1)
Sodium: 140 mEq/L (ref 135–145)
Total Protein: 6 g/dL (ref 6.0–8.3)

## 2010-06-18 LAB — LIPID PANEL
LDL Cholesterol: 60 mg/dL (ref 0–99)
Total CHOL/HDL Ratio: 2.3 RATIO

## 2010-06-18 LAB — HEMOGLOBIN A1C: Mean Plasma Glucose: 117 mg/dL — ABNORMAL HIGH (ref <117)

## 2010-06-18 LAB — CARDIAC PANEL(CRET KIN+CKTOT+MB+TROPI)
CK, MB: 1.6 ng/mL (ref 0.3–4.0)
Total CK: 539 U/L — ABNORMAL HIGH (ref 7–232)
Troponin I: <0.3 ng/mL (ref <0.30)

## 2010-06-18 LAB — TSH: TSH: 1.658 u[IU]/mL (ref 0.350–4.500)

## 2010-06-19 DIAGNOSIS — G459 Transient cerebral ischemic attack, unspecified: Secondary | ICD-10-CM

## 2010-06-19 LAB — GLUCOSE, CAPILLARY

## 2010-06-25 ENCOUNTER — Encounter: Payer: Self-pay | Admitting: Internal Medicine

## 2010-06-29 NOTE — Consult Note (Signed)
NAMEDOYCE, SALING NO.:  [id number]  MEDICAL RECORD NO.:  [id number]           PATIENT TYPE:  E  LOCATION:  [facility]                         FACILITY:  [facility]  PHYSICIAN:  Thana Farr, MD    DATE OF BIRTH:  1931/12/21  DATE OF CONSULTATION:  06/17/2010 DATE OF DISCHARGE:                                CONSULTATION   REASON FOR CONSULTATION:  Dizziness with nausea and vomiting.  HISTORY OF PRESENT ILLNESS:  This is a 77 year old white male who was in his usual state of health when he woke this morning at 5:40.  While eating breakfast at 6 o'clock in the morning he began having slight dizziness with a cold sweat.  Gradually he became more dizzy and diaphoretic and felt "withdrawn when he walked."  Eventually he had to crawl to get to the door to allow EMS in.  When he would lie down flat with head still and eyes open the room would spin.  He had nausea and vomiting.  Upon return from the MR scan, the patient had some difficulties with walking - slightly off balance per report and dizzy. However, during examination, the patient is experiencing no dizziness at this time.  Neuro was consulted for evaluation for possible neurologic etiology for this morning's dizziness.  PAST MEDICAL HISTORY:  Significant for coronary artery disease status post MI in 2006 with stenting to the distal RCA and mid LAD.  He was recathed in 2009 with patent nonobstructive disease.  Hypertension, hyperlipidemia, hypothyroidism, laminectomy remotely (1965) and bilateral carpal tunnel release remotely.  STROKE RISK FACTORS:  Hyperlipidemia, hypertension and sedentary lifestyle.  MEDICATIONS AS OUTPATIENT: 1. Aspirin 81 mg a day. 2. Plavix 75 mg a day. 3. Isosorbide. 4. Carvedilol. 5. Simvastatin. 6. TriCor - the patient does not know doses of these medications.  ALLERGIES:  NKDA.  FAMILY HISTORY:  Noncontributory to this consult.  SOCIAL HISTORY:  The patient is divorced.   One daughter.  He suffers from alcoholism.  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  He quit smoking a few years ago and drinks about 1 beer a day.  He was a heavy Acupuncturist drinker and quit that about 2 months ago.  No illicit drug use.  REVIEW OF SYSTEMS:  NEURO:  Exam is negative for slurred speech, dysphasia, headache, weakness, diplopia, numbness, or blurred vision. No chest pain or shortness of breath on cardiac assessment.  PULMONARY: No shortness of breath.  GI:  Positive for nausea and vomiting this morning.  No diarrhea or constipation.  GENITOURINARY:  Nocturia x2-3. No dysuria or hematuria.  MUSCULOSKELETAL:  Negative.  ENDOCRINE: Hypothyroidism.  ALLERGY/IMMUNOLOGIC:  Denies sinus congestion or earache.  PSYCHIATRIC:  History of depression, anxiety, and insomnia. He has not been able to tolerate antidepressant therapy in the past. Currently, no suicidal or homicidal ideation. The patient denies prior history of seizure or stroke.  PHYSICAL EXAMINATION:  VITAL SIGNS:  Temperature 98.2, blood pressure is 137/66, heart rate 60, respirations 18, SaO2 18, SaO2 is 100% on room air. HEENT:  Normocephalic and atraumatic.  Cerumen  impaction in the left ear.  Right TM okay.  Slight nasal inflammation especially on the left. Mucosa within normal limits. NECK:  Supple without bruits. LUNGS:  Clear to auscultation. HEART:  Regular rate and rhythm without murmurs, gallops, or rubs. ABDOMEN:  Normal bowel sounds. NEURO:  The patient is alert and oriented x3 with fluent speech, good insight and thought content.  Facial symmetry.  PERRL.  EOMI.  Visual fields intact.  No nystagmus.  Tongue is midline.  Uvula elevates on phonation. EXTREMITIES:  Motor 5/5 upper and lower extremities bilaterally with grip strength equal bilaterally. RAMI.  Sensory exam is intact.  DTRs 2+ with downgoing plantars bilaterally.  Finger-to-nose, heel-to-shin  are intact.  TEST RESULTS:  Sodium 140, potassium 4.0, BUN 21, and creatinine 1.37. White blood cells 6.4, hemoglobin 11.4, platelets 160.  INR 1.06. Telemetry shows sinus rhythm.  MR without contrast media on Jun 17, 2010 shows questionable small infarct in the left paracentral pons region with global atrophy, no evidence of hydrocephalus.  Does have small vessel ischemic changes diffusely.  There is paranasal sinus opacification and mucosal thickening.  No hemorrhage.  CTA of the head and neck showed hepatic vessels without significant narrowing.  No aneurysm.  ASSESSMENT AND PLAN:  This is a 78 year old white male with coronary artery disease, hypertension, hypothyroidism, who had onset of progressive dizziness after waking this morning.  He did experience vertigo.  Presently his dizziness is resolved and neuro exam is unremarkable.  Dr. Thad Ranger, has reviewed the MR and CTA and feels that the questionable infarct in the left paracentral pons is most likely artifact.  Would recommend continuing his aspirin and Plavix therapy. Check orthostatics.  Does not require any further neurological evaluation at this time.  If dizziness persists he may require outpatient followup or referral to ENT.  Thank you for allowing [place] to assist in the management of this patient.     Luan Moore, P.A.   ______________________________ Thana Farr, MD    TCJ/MEDQ  D:  06/17/2010  T:  06/18/2010  Job:  [id number]  cc:   Thana Farr, MD  Electronically Signed by Delice Bison JERNEJCIC P.A. on 06/28/2010 01:59:57 PM Electronically Signed by Thana Farr MD on 06/29/2010 12:41:13 PM

## 2010-07-02 NOTE — Discharge Summary (Signed)
NAMEALIOUNE, HODGKIN              ACCOUNT NO.:  [id number]  MEDICAL RECORD NO.:  [id number]           PATIENT TYPE:  E  LOCATION:  [facility]                         FACILITY:  [facility]  PHYSICIAN:  Briany Aye A. Sheffield Slider, M.D.    DATE OF BIRTH:  1931-11-16  DATE OF ADMISSION:  06/17/2010 DATE OF DISCHARGE:  06/19/2010                              DISCHARGE SUMMARY   DISCHARGE DIAGNOSES: 1. Weakness and dizziness, resolved. 2. Possible pontine stroke. 3. Coronary artery disease. 4. Hyperlipidemia. 5. Hypothyroidism. 6. Hypertension.  DISCHARGE MEDICATIONS:  New medication, meclizine 25 mg p.o. b.i.d. p.r.n. for dizziness.  He is to continue the following medications: 1. Artificial tears 1 drop each eye daily p.r.n. 2. Aspirin 81 mg 1 tab p.o. daily. 3. Carvedilol 1/2 tab p.o. b.i.d. 4. Fish oil 1 cap p.o. daily. 5. Isosorbide mononitrate. 6. Melatonin 1 tab p.o. at bedtime. 7. Plavix 75 mg 1 tab p.o. daily. 8. Simvastatin 1 tab p.o. at bedtime. 9. Synthroid 1 tab p.o. at bedtime. 10.TriCor 1 tab p.o. daily. 11.Vitamin D 1 tab p.o. daily.  CONSULTATIONS:  Neurology.  PROCEDURES AND IMAGING: 1. He had an MRI of the brain on Jun 17, 2010, impression:     a.     Questionable tiny acute infarct of the left paracentral      lower pons.     b.     No intracranial hemorrhage.     c.     Mild global atrophy without hydrocephalus.     d.     Mild small vessel disease type changes.     e.     Paranasal sinus opacification and mucosal thickening. 2. He had a CT angio of the head, impression:     a.     Ectatic vertebral arteries and basilar artery without      significant narrowing.     b.     Calcification with mild narrowing cavernous segment of the      internal carotid artery bilaterally.     c.     Mild branch vessel irregularity     d.     Mild paranasal sinus, mucosal thickening, most notable in      the maxillary sinus and the left sphenoid sinus. 3. He had an echocardiogram,  impression:  Left ventricle normal cavity     size.  Wall thickness was normal.  Systolic function was normal.     Estimated EF of between 55 and 60%.  Normal wall motion.  Doppler     parameters are consistent with grade 1 diastolic dysfunction.  LABS:  At the time of admission, CBC with WBC of 6.4, hemoglobin 11.4, hematocrit 32.6, platelets of 160.  INR 1.06, sodium 140, potassium 4.0, chloride 106, bicarb of 28, BUN of 21, creatinine of 1.37, and glucose of 137.  Urinalysis was unremarkable.  Cardiac panel with negative cardiac enzymes x3.  Lipid profile; total cholesterol 138, triglycerides of 95, HDL of 59, and LDL of 60.  Hemoglobin A1c was 5.7.  TSH was 1.658.  BRIEF HOSPITAL COURSE:  This is a 77 year old male with history of coronary artery  disease, presented with acute dizziness and weakness.  1. Dizziness and weakness.  The patient's symptoms were consistent     with vertiginous-type symptoms.  Also, his symptoms resolved with     meclizine.  However, given his risk factors, there was concern for     possible stroke.  The patient was admitted to telemetry and     monitored.  The patient was risk stratified with hemoglobin A1c     which was mildly elevated as well as fasting lipid panel.  Lipids     found to be in good control.  The patient is currently on statin as     well as fibrate.  The patient is also already on Plavix as well as     baby aspirin at home for his coronary artery disease.  At the time     of admission, the patient was given full-dose aspirin and continued     on his Plavix.  MRI of the head was obtained and showed possible     small pontine stroke.  Neurology was consulted during the     hospitalization and felt like his dizziness may have been     contributed by pontine stroke.  However, given that his symptoms     resolved with meclizine, it was felt that it was more vertiginous     symptoms.  Other imaging including echo as well as CT angio of the      head showed no significant abnormalities.  At the time of     discharge, the patient will be continued on baby aspirin as well as     Plavix.  He will also be continued on meclizine for control of     vertigo symptoms. 2. Coronary artery disease.  The patient was continued as above on his     Plavix and aspirin throughout hospitalization.  He was ruled out     for acute coronary syndrome with cardiac enzymes negative x3.  Risk     stratification is good control of lipids.  The patient does not     appear to be diabetic at this time either.  The patient denied any     chest pain during the hospitalization. 3. Hypertension.  The patient was continued on his isosorbide as well     as his carvedilol during the hospitalization. 4. Hyperlipidemia.  The patient was continued on simvastatin and     TriCor during the hospitalization. 5. Hypothyroidism.  The patient was continued on Synthroid throughout     the hospitalization.  DISCHARGE INSTRUCTIONS:  The patient was instructed to increase activity slowly.  He was found to have no physical therapy, occupational therapy needs during the hospitalization.  He is to follow a heart-healthy diet. He was reminded that if he was to have return of his symptoms that did not resolve with meclizine or other symptoms including slurred speech or weakness that he should return to the ED.  FOLLOWUP:  He is to follow up with his primary care provider within the next month.  He is also to follow up with Dr. Pearlean Brownie in 2 months.  DISCHARGE CONDITION:  The patient was discharged home in stable medical condition.    ______________________________ Everrett Coombe, MD   ______________________________ Arnette Norris. Sheffield Slider, M.D.    CM/MEDQ  D:  06/22/2010  T:  06/22/2010  Job:  [id number]  Electronically Signed by Everrett Coombe MD on 06/24/2010 11:30:24 PM Electronically Signed by Zachery Dauer M.D. on 07/02/2010 07:21:56  AM

## 2010-07-03 ENCOUNTER — Encounter: Payer: Self-pay | Admitting: Internal Medicine

## 2010-07-03 ENCOUNTER — Other Ambulatory Visit: Payer: Self-pay

## 2010-07-03 MED ORDER — NITROGLYCERIN 0.4 MG SL SUBL: 0.4000 mg | SUBLINGUAL_TABLET | SUBLINGUAL | Status: DC | PRN

## 2010-07-16 ENCOUNTER — Encounter: Payer: Self-pay | Admitting: Internal Medicine

## 2010-07-16 ENCOUNTER — Ambulatory Visit (INDEPENDENT_AMBULATORY_CARE_PROVIDER_SITE_OTHER): Payer: Medicare Other | Admitting: Internal Medicine

## 2010-07-16 VITALS — BP 112/62 | HR 62 | Resp 16 | Ht 68.0 in | Wt 168.0 lb

## 2010-07-16 DIAGNOSIS — I2510 Atherosclerotic heart disease of native coronary artery without angina pectoris: Secondary | ICD-10-CM

## 2010-07-16 MED ORDER — CARVEDILOL 12.5 MG PO TABS: 6.2500 mg | ORAL_TABLET | Freq: Two times a day (BID) | ORAL | Status: DC

## 2010-07-16 NOTE — Patient Instructions (Signed)
Your physician wants you to follow-up in: 1 year. You will receive a reminder letter in the mail two months in advance. If you don't receive a letter, please call our office to schedule the follow-up appointment.  

## 2010-07-16 NOTE — Progress Notes (Signed)
HPI:  Mr. Tschantz is a very pleasant 77 year old former [employer] with a history of coronary artery disease.  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  He underwent placement of Cypher drug-eluting stent to the distal right coronary artery and the mid LAD.  At that time, ejection fraction was normal. Cath 12/09 with nonobstructive CAD and patent stents.  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  Over [facility] day admitted with transient severe dizziness and n/v - vertigo vs TIA. Admitted to [facility]. Had MRI/MRA with ? defect in the pons - eventually defect felt to be artifact.   ECHO EF 55-60% grade 1 diastolic dysfunction. Normal RV.   Doing well from a cardiac standpoint. No CP. Chronic exertional dyspnea. Remains active with [employer] and collecting can tabs. Struggling with depression at times. Cut carvedilol back on his own due to low BP.   ROS: All systems negative except as listed in HPI, PMH and Problem List.  Past Medical History  Diagnosis Date  . Unspecified disorder of kidney and ureter   . Anemia, unspecified   . Flatulence, eructation, and gas pain   . Hemorrhage of rectum and anus   . Nonspecific abnormal finding in stool contents   . Unspecified essential hypertension   . Cough   . Carpal tunnel syndrome   . Coronary atherosclerosis of unspecified type of vessel, native or graft   . Hyperchylomicronemia   . Esophageal reflux     Current Outpatient Prescriptions  Medication Sig Dispense Refill  . aspirin 81 MG EC tablet Take 81 mg by mouth daily.        . carvedilol (COREG) 12.5 MG tablet Take 12.5 mg by mouth. 1/2 po bid      . Cholecalciferol (VITAMIN D3) 3000 UNITS TABS Take by mouth daily. Dosage not specified       . clopidogrel (PLAVIX) 75 MG tablet Take 75 mg by mouth daily.        . fenofibrate (TRICOR) 145 MG tablet Take 145 mg by mouth daily.        . isosorbide mononitrate (IMDUR) 60 MG 24 hr  tablet Take 1 tablet (60 mg total) by mouth daily.  30 tablet  1  . levothyroxine (SYNTHROID, LEVOTHROID) 50 MCG tablet Take 50 mcg by mouth daily.        . meclizine (ANTIVERT) 25 MG tablet Take 25 mg by mouth 3 (three) times daily as needed.        . Melatonin 300 MCG TABS Take by mouth daily.        . Multiple Vitamins-Minerals (MULTIVITAL PLATINUM PO) Take by mouth daily.        . nitroGLYCERIN (NITROSTAT) 0.4 MG SL tablet Place 1 tablet (0.4 mg total) under the tongue every 5 (five) minutes as needed.  90 tablet  3  . Omega-3 Fatty Acids (FISH OIL) 1000 MG CAPS Take by mouth daily.        . simvastatin (ZOCOR) 20 MG tablet TAKE ONE (1) TABLET(S) ONCE DAILY  30 tablet  0  . DISCONTD: DULoxetine (CYMBALTA) 30 MG capsule Take 30 mg by mouth daily.        Marland Kitchen DISCONTD: naproxen (NAPROSYN) 500 MG tablet Take 500 mg by mouth as needed.          PHYSICAL EXAM: Filed Vitals:   07/16/10 1354  BP: 112/62  Pulse: 62  Resp: 16  GENERAL:  He is no acute  distress, ambulates around the clinic without any respiratory difficulty. HEENT:  Normal except for poor dentition. NECK:  Supple.  No JVD.  Carotids are 2+ bilaterally without any bruits. There is no lymphadenopathy or thyromegaly. CARDIAC:  PMI is nondisplaced.  He has distant heart sounds.  He is regular with an S4.  No murmur. LUNGS:  Clear. ABDOMEN:  Soft, nontender, nondistended.  No hepatosplenomegaly, no bruits, no masses.  Good bowel sounds.  EXTREMITIES:  Warm with no cyanosis, clubbing, or edema.  No rash. NEURO:  Alert and x3.  Cranial nerves II through XII are intact.  Moves all 4 extremities without difficulty.      ASSESSMENT & PLAN:

## 2010-07-16 NOTE — Assessment & Plan Note (Signed)
No evidence of ischemia. Continue current regimen. Lipids followed by PCP. Last LDL at goal.

## 2010-07-18 DIAGNOSIS — H8110 Benign paroxysmal vertigo, unspecified ear: Secondary | ICD-10-CM | POA: Insufficient documentation

## 2010-07-23 ENCOUNTER — Other Ambulatory Visit: Payer: Self-pay | Admitting: Internal Medicine

## 2010-07-26 NOTE — H&P (Signed)
Shawn Sanchez, Shawn Sanchez              ACCOUNT NO.:  [id number]  MEDICAL RECORD NO.:  [id number]           PATIENT TYPE:  E  LOCATION:  [facility]                         FACILITY:  [facility]  PHYSICIAN:  Avenir Lozinski A. [name], M.D.    DATE OF BIRTH:  06/20/1931  DATE OF ADMISSION:  06/17/2010 DATE OF DISCHARGE:                             HISTORY & PHYSICAL   PRIMARY CARE PROVIDER:  Ardeth Sportsman, MD in [facility].  CHIEF COMPLAINT:  Dizziness and weakness.  HISTORY OF PRESENT ILLNESS:  This is a 77 year old male with a 1-day history of weakness, dizziness, nausea, and diaphoresis.  The patient states that he woke up this morning in otherwise normal state of health; however, later on in the morning, the patient noticed severe weakness and dizziness after eating his breakfast.  The patient states that the weakness and dizziness persisted throughout the morning with progression into actual nausea and diaphoresis after a bowel movement.  The patient subsequently called EMS for persistent symptoms and concern for stroke. The patient states that he has not had any symptoms like this previously.  The patient reports dizziness as feeling of constant room spinning.  No chest pain, no shortness of breath, no increased work of breathing associated with symptoms per the patient.  The patient denies any loss of consciousness associated with episodes.  No headache, no confusion, no visual symptoms per the patient.  The patient denies any recent illnesses.  The patient was otherwise able to ambulate without incident prior to episode.  No history of stroke.  PAST MEDICAL HISTORY: 1. Coronary artery disease status post stents in 2006, current     cardiologist is Dr. Gala Romney. 2. Hyperlipidemia.  ALLERGIES:  ISOCAINE.  MEDICATIONS: 1. Plavix 75 mg p.o. daily. 2. Aspirin 81 mg p.o. at bedtime. 3. Carvedilol. 4. TriCor. 5. Isosorbide.  PAST SURGICAL HISTORY: 1. Carpal tunnel surgery  bilateral in 1988. 2. Laminectomy of lower back in 1966.  SOCIAL HISTORY:  The patient currently lives alone.  The patient is retired from [employer].  No tobacco use.  The patient reports intermittent beer intake.  No drug intake per the patient.  REVIEW OF SYSTEMS:  Negative except as noted above in HPI.  PHYSICAL EXAMINATION:  VITAL SIGNS:  Temperature 98, heart rate 47-60, respirations 18, blood pressure 130s/160s, sating 98% on room air. GENERAL:  The patient is up in bed, in no acute distress. HEENT:  Normocephalic, atraumatic.  Extraocular movements intact.  Mild right-sided horizontal nystagmus. NECK:  Supple, full range of motion.  No JVD. CARDIOVASCULAR:  Regular rate and rhythm.  No rubs, gallops, or murmurs auscultated. LUNGS:  Clear to auscultation bilaterally.  No wheezes, rales, or rhonchi. ABDOMEN:  Soft, nontender, positive bowel sounds. BACK:  Nontender.  No CVA tenderness.  No paraspinal tenderness. EXTREMITIES:  2+ peripheral pulses.  No edema noted. NEUROLOGIC:  Positive right-sided horizontal nystagmus.  Dix-Hallpike negative.  Neuro exam otherwise negative. MSK:  4-5/5 strength diffusely.  LABS AND STUDIES: 1. CBC with a white count of 6.4, hemoglobin 11.4, platelet count of     160, MCV of 92.9. 2. BMET with  sodium 140, potassium 4.0, chloride 106, CO2 of 28, BUN     21, creatinine 1.37, glucose of 137 with a calcium of 9.4. 3. INR of 1.06. 4. MRI of the brain showing questionable tiny infarct in left     paracentral pons with global mild atrophy, mild small vessel     disease. 5. CT angiogram of the head showing ectatic vertebral arteries and     basilar artery without narrowing with calcification and mild     narrowing of cavernous sinus of the ICA bilaterally.  Mild     paranasal sinus mucosal thickening.  ASSESSMENT AND PLAN:  This is a 77 year old male with acute dizziness, weakness with questionable left pontine stroke. 1. Weakness and  dizziness.  This is likely secondary to vertigo and     possible pontine stroke.  Of note, Neurology was consulted in the     emergency room with recommendation for a stroke protocol     evaluation.  We will proceed with stroke protocol evaluation     including 2-D echocardiogram.  CT angiogram of head was negative     for any vessel occlusion which is reassuring.  We will continue the     patient on home Plavix and we will place on full-dose aspirin.  As     stated before, there is a vertiginous component of symptoms with     resolution of symptoms after Antivert in the emergency department.     We will continue Antivert while in-house.  Overall, the patient is     symptomatically much improved status post Antivert.  We will also     check orthostatics. 2. Coronary artery disease.  We will continue the patient on home     aspirin and Plavix.  We will continue home Coreg and Imdur per med     rec as the patient is unsure of medication dosing. 3. Hyperlipidemia.  We will continue home TriCor.  We will check a     fasting lipid panel. 4. Fluids, electrolytes, nutrition/gastrointestinal.  Normal saline at     100 mL/hour, heart-healthy diet. 5. Prophylaxis.  Lovenox. 6. Disposition.  Pending further evaluation.     Doree Albee, MD   ______________________________ Shawn Sanchez, M.D.    SN/MEDQ  D:  06/18/2010  T:  06/18/2010  Job:  [id number]  Electronically Signed by Doree Albee  on 07/24/2010 10:11:44 AM Electronically Signed by Zachery Dauer M.D. on 07/26/2010 11:39:49 AM

## 2010-08-20 ENCOUNTER — Other Ambulatory Visit: Payer: Self-pay | Admitting: Internal Medicine

## 2010-09-13 ENCOUNTER — Other Ambulatory Visit: Payer: Self-pay | Admitting: Cardiology

## 2010-10-05 ENCOUNTER — Other Ambulatory Visit: Payer: Self-pay | Admitting: Internal Medicine

## 2010-10-20 ENCOUNTER — Other Ambulatory Visit: Payer: Self-pay | Admitting: Cardiology

## 2010-10-24 ENCOUNTER — Emergency Department (HOSPITAL_COMMUNITY)
Admission: EM | Admit: 2010-10-24 | Discharge: 2010-10-24 | Disposition: A | Discharge status: Home / Self Care | Payer: Medicare Other | Attending: Emergency Medicine | Admitting: Emergency Medicine

## 2010-10-24 ENCOUNTER — Emergency Department (HOSPITAL_COMMUNITY): Payer: Medicare Other

## 2010-10-24 DIAGNOSIS — I252 Old myocardial infarction: Secondary | ICD-10-CM | POA: Insufficient documentation

## 2010-10-24 DIAGNOSIS — Z79899 Other long term (current) drug therapy: Secondary | ICD-10-CM | POA: Insufficient documentation

## 2010-10-24 DIAGNOSIS — R112 Nausea with vomiting, unspecified: Secondary | ICD-10-CM | POA: Insufficient documentation

## 2010-10-24 DIAGNOSIS — Z8673 Personal history of transient ischemic attack (TIA), and cerebral infarction without residual deficits: Secondary | ICD-10-CM | POA: Insufficient documentation

## 2010-10-24 DIAGNOSIS — R42 Dizziness and giddiness: Secondary | ICD-10-CM | POA: Insufficient documentation

## 2010-10-24 LAB — BASIC METABOLIC PANEL
BUN: 23 mg/dL (ref 6–23)
Creatinine, Ser: 1.35 mg/dL (ref 0.50–1.35)
GFR calc Af Amer: 57 mL/min — ABNORMAL LOW (ref >90)
GFR calc non Af Amer: 49 mL/min — ABNORMAL LOW (ref >90)

## 2010-10-24 LAB — CBC
HCT: 32.5 % — ABNORMAL LOW (ref 39.0–52.0)
MCH: 32.4 pg (ref 26.0–34.0)
MCHC: 34.8 g/dL (ref 30.0–36.0)
MCV: 93.1 fL (ref 78.0–100.0)
RDW: 12.7 % (ref 11.5–15.5)

## 2010-10-24 LAB — DIFFERENTIAL
Eosinophils Relative: 4 % (ref 0–5)
Lymphocytes Relative: 19 % (ref 12–46)
Lymphs Abs: 1.4 10*3/uL (ref 0.7–4.0)
Monocytes Absolute: 0.5 10*3/uL (ref 0.1–1.0)

## 2010-10-26 LAB — D-DIMER, QUANTITATIVE: D-Dimer, Quant: <0.22 ug/mL-FEU (ref 0.00–0.48)

## 2010-11-20 ENCOUNTER — Other Ambulatory Visit: Payer: Self-pay | Admitting: Cardiology

## 2011-01-28 DIAGNOSIS — H2510 Age-related nuclear cataract, unspecified eye: Secondary | ICD-10-CM | POA: Diagnosis not present

## 2011-02-06 DIAGNOSIS — I6350 Cerebral infarction due to unspecified occlusion or stenosis of unspecified cerebral artery: Secondary | ICD-10-CM | POA: Diagnosis not present

## 2011-02-06 DIAGNOSIS — E785 Hyperlipidemia, unspecified: Secondary | ICD-10-CM | POA: Diagnosis not present

## 2011-02-06 DIAGNOSIS — I10 Essential (primary) hypertension: Secondary | ICD-10-CM | POA: Diagnosis not present

## 2011-02-06 DIAGNOSIS — R42 Dizziness and giddiness: Secondary | ICD-10-CM | POA: Diagnosis not present

## 2011-02-21 DIAGNOSIS — N3941 Urge incontinence: Secondary | ICD-10-CM | POA: Diagnosis not present

## 2011-02-21 DIAGNOSIS — R3915 Urgency of urination: Secondary | ICD-10-CM | POA: Diagnosis not present

## 2011-03-15 DIAGNOSIS — J8410 Pulmonary fibrosis, unspecified: Secondary | ICD-10-CM | POA: Diagnosis not present

## 2011-03-15 DIAGNOSIS — N318 Other neuromuscular dysfunction of bladder: Secondary | ICD-10-CM | POA: Diagnosis not present

## 2011-03-15 DIAGNOSIS — K5641 Fecal impaction: Secondary | ICD-10-CM | POA: Diagnosis not present

## 2011-03-15 DIAGNOSIS — E119 Type 2 diabetes mellitus without complications: Secondary | ICD-10-CM | POA: Diagnosis not present

## 2011-03-15 DIAGNOSIS — I10 Essential (primary) hypertension: Secondary | ICD-10-CM | POA: Diagnosis not present

## 2011-03-15 DIAGNOSIS — K5900 Constipation, unspecified: Secondary | ICD-10-CM | POA: Diagnosis not present

## 2011-03-15 DIAGNOSIS — I2510 Atherosclerotic heart disease of native coronary artery without angina pectoris: Secondary | ICD-10-CM | POA: Diagnosis not present

## 2011-03-15 DIAGNOSIS — E039 Hypothyroidism, unspecified: Secondary | ICD-10-CM | POA: Diagnosis not present

## 2011-03-27 DIAGNOSIS — N3941 Urge incontinence: Secondary | ICD-10-CM | POA: Diagnosis not present

## 2011-03-27 DIAGNOSIS — R3915 Urgency of urination: Secondary | ICD-10-CM | POA: Diagnosis not present

## 2011-04-30 DIAGNOSIS — E039 Hypothyroidism, unspecified: Secondary | ICD-10-CM | POA: Diagnosis not present

## 2011-04-30 DIAGNOSIS — Z125 Encounter for screening for malignant neoplasm of prostate: Secondary | ICD-10-CM | POA: Diagnosis not present

## 2011-04-30 DIAGNOSIS — E785 Hyperlipidemia, unspecified: Secondary | ICD-10-CM | POA: Diagnosis not present

## 2011-04-30 DIAGNOSIS — I10 Essential (primary) hypertension: Secondary | ICD-10-CM | POA: Diagnosis not present

## 2011-05-03 DIAGNOSIS — R3915 Urgency of urination: Secondary | ICD-10-CM | POA: Diagnosis not present

## 2011-05-03 DIAGNOSIS — N3941 Urge incontinence: Secondary | ICD-10-CM | POA: Diagnosis not present

## 2011-05-07 DIAGNOSIS — Z0000 Encounter for general adult medical examination without abnormal findings: Secondary | ICD-10-CM | POA: Diagnosis not present

## 2011-05-07 DIAGNOSIS — I2510 Atherosclerotic heart disease of native coronary artery without angina pectoris: Secondary | ICD-10-CM | POA: Diagnosis not present

## 2011-05-07 DIAGNOSIS — H8110 Benign paroxysmal vertigo, unspecified ear: Secondary | ICD-10-CM | POA: Diagnosis not present

## 2011-05-07 DIAGNOSIS — Z1212 Encounter for screening for malignant neoplasm of rectum: Secondary | ICD-10-CM | POA: Diagnosis not present

## 2011-05-08 DIAGNOSIS — N320 Bladder-neck obstruction: Secondary | ICD-10-CM | POA: Diagnosis not present

## 2011-05-08 DIAGNOSIS — N3941 Urge incontinence: Secondary | ICD-10-CM | POA: Diagnosis not present

## 2011-05-08 DIAGNOSIS — N400 Enlarged prostate without lower urinary tract symptoms: Secondary | ICD-10-CM | POA: Diagnosis not present

## 2011-05-31 DIAGNOSIS — R262 Difficulty in walking, not elsewhere classified: Secondary | ICD-10-CM | POA: Diagnosis not present

## 2011-05-31 DIAGNOSIS — R269 Unspecified abnormalities of gait and mobility: Secondary | ICD-10-CM | POA: Diagnosis not present

## 2011-06-05 DIAGNOSIS — R269 Unspecified abnormalities of gait and mobility: Secondary | ICD-10-CM | POA: Diagnosis not present

## 2011-06-05 DIAGNOSIS — R262 Difficulty in walking, not elsewhere classified: Secondary | ICD-10-CM | POA: Diagnosis not present

## 2011-06-06 DIAGNOSIS — R3915 Urgency of urination: Secondary | ICD-10-CM | POA: Diagnosis not present

## 2011-06-06 DIAGNOSIS — N320 Bladder-neck obstruction: Secondary | ICD-10-CM | POA: Diagnosis not present

## 2011-06-06 DIAGNOSIS — N400 Enlarged prostate without lower urinary tract symptoms: Secondary | ICD-10-CM | POA: Diagnosis not present

## 2011-06-12 DIAGNOSIS — R269 Unspecified abnormalities of gait and mobility: Secondary | ICD-10-CM | POA: Diagnosis not present

## 2011-06-12 DIAGNOSIS — R262 Difficulty in walking, not elsewhere classified: Secondary | ICD-10-CM | POA: Diagnosis not present

## 2011-06-23 ENCOUNTER — Other Ambulatory Visit: Payer: Self-pay | Admitting: *Deleted

## 2011-06-23 MED ORDER — SIMVASTATIN 20 MG PO TABS: 20.0000 mg | ORAL_TABLET | Freq: Every day | ORAL | Status: DC

## 2011-06-26 DIAGNOSIS — R262 Difficulty in walking, not elsewhere classified: Secondary | ICD-10-CM | POA: Diagnosis not present

## 2011-06-26 DIAGNOSIS — R269 Unspecified abnormalities of gait and mobility: Secondary | ICD-10-CM | POA: Diagnosis not present

## 2011-07-23 DIAGNOSIS — H8109 Meniere's disease, unspecified ear: Secondary | ICD-10-CM | POA: Diagnosis not present

## 2011-07-23 DIAGNOSIS — R42 Dizziness and giddiness: Secondary | ICD-10-CM | POA: Diagnosis not present

## 2011-07-23 DIAGNOSIS — K219 Gastro-esophageal reflux disease without esophagitis: Secondary | ICD-10-CM | POA: Diagnosis not present

## 2011-07-24 ENCOUNTER — Ambulatory Visit (INDEPENDENT_AMBULATORY_CARE_PROVIDER_SITE_OTHER): Payer: Medicare Other | Admitting: Cardiology

## 2011-07-24 ENCOUNTER — Encounter: Payer: Self-pay | Admitting: Cardiology

## 2011-07-24 VITALS — BP 100/60 | HR 55 | Ht 68.0 in | Wt 168.4 lb

## 2011-07-24 DIAGNOSIS — R0989 Other specified symptoms and signs involving the circulatory and respiratory systems: Secondary | ICD-10-CM | POA: Diagnosis not present

## 2011-07-24 DIAGNOSIS — E785 Hyperlipidemia, unspecified: Secondary | ICD-10-CM

## 2011-07-24 DIAGNOSIS — R0600 Dyspnea, unspecified: Secondary | ICD-10-CM

## 2011-07-24 DIAGNOSIS — I10 Essential (primary) hypertension: Secondary | ICD-10-CM | POA: Diagnosis not present

## 2011-07-24 DIAGNOSIS — I2510 Atherosclerotic heart disease of native coronary artery without angina pectoris: Secondary | ICD-10-CM

## 2011-07-24 NOTE — Assessment & Plan Note (Addendum)
Continue aspirin and statin. Discontinue Plavix. He is describing some dyspnea on exertion which she attributes to deconditioning. We will schedule a Myoview to quantify LV function and to exclude ischemia.

## 2011-07-24 NOTE — Progress Notes (Signed)
HPI: Shawn Sanchez 78 year old male previously followed by Dr Gala Romney for fu of coronary artery disease. 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 He underwent placement of Cypher drug-eluting stent to the distal right coronary artery and the mid LAD. At that time, ejection fraction was normal. Cath 12/09 with nonobstructive CAD and patent stents. 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 Echo 5/12 EF 55-60% grade 1 diastolic dysfunction. Normal RV.  Last seen 6/12. Since then, he has noted some increased dyspnea. No orthopnea, PND, pedal edema, palpitations, syncope or exertional chest pain.   Current Outpatient Prescriptions  Medication Sig Dispense Refill  . aspirin 81 MG EC tablet Take 81 mg by mouth daily.        . carvedilol (COREG) 12.5 MG tablet TAKE ONE (1) TABLET(S) TWICE DAILY  60 tablet  9  . clopidogrel (PLAVIX) 75 MG tablet Take 75 mg by mouth daily.        . isosorbide mononitrate (IMDUR) 60 MG 24 hr tablet TAKE 1 TABLET DAILY  30 tablet  6  . levothyroxine (SYNTHROID, LEVOTHROID) 50 MCG tablet Take 50 mcg by mouth daily.        . Melatonin 300 MCG TABS Take by mouth daily. 5 MG      . Multiple Vitamins-Minerals (MULTIVITAL PLATINUM PO) Take by mouth daily.        . Omega-3 Fatty Acids (FISH OIL) 1000 MG CAPS Take by mouth daily.        . simvastatin (ZOCOR) 20 MG tablet Take 1 tablet (20 mg total) by mouth daily.  30 tablet  10  . Tamsulosin HCl (FLOMAX) 0.4 MG CAPS 0.4 mg daily.          Past Medical History  Diagnosis Date  . Unspecified disorder of kidney and ureter   . Anemia, unspecified   . Flatulence, eructation, and gas pain   . Hemorrhage of rectum and anus   . Nonspecific abnormal finding in stool contents   . Unspecified essential hypertension   . Cough   . Carpal tunnel syndrome   . Coronary atherosclerosis of unspecified type of vessel, native or graft   . Hyperchylomicronemia   . Esophageal  reflux     Past Surgical History  Procedure Date  . Angioplasy/stent 2006  . Lamenectomy 1965    History   Social History  . Marital Status: Divorced    Spouse Name: N/A    Number of Children: N/A  . Years of Education: N/A   Occupational History  . Not on file.   Social History Main Topics  . Smoking status: Former Smoker    Types: Cigars  . Smokeless tobacco: Not on file   Comment: cigars only in 2005  . Alcohol Use: Yes     Scotch   . Drug Use: No  . Sexually Active: Not on file   Other Topics Concern  . Not on file   Social History Narrative   Retired; divorced. Daily Caffeine use- 6-7 cups. Pt does not get regular exercise.     ROS: some problems with vertigo and right hip pain but no fevers or chills, productive cough, hemoptysis, dysphasia, odynophagia, melena, hematochezia, dysuria, hematuria, rash, seizure activity, orthopnea, PND, pedal edema, claudication. Remaining systems are negative.  Physical Exam: Well-developed well-nourished in no acute distress.  Skin is warm and dry.  HEENT is normal.  Neck is supple.  Chest is clear to auscultation with normal expansion.  Cardiovascular exam is regular rate and rhythm.  Abdominal exam nontender or distended. No masses palpated. Extremities show no edema. neuro grossly intact  ECG sinus bradycardia at a rate of 55. No ST changes.

## 2011-07-24 NOTE — Assessment & Plan Note (Signed)
Continue statin. Lipids and liver monitored by primary care. 

## 2011-07-24 NOTE — Patient Instructions (Addendum)
Your physician wants you to follow-up in: ONE YEAR WITH DR Shelda Pal will receive a reminder letter in the mail two months in advance. If you don't receive a letter, please call our office to schedule the follow-up appointment.   STOP PLAVIX  Your physician has requested that you have en exercise stress myoview. For further information please visit [email or web address] Please follow instruction sheet, as given.

## 2011-07-24 NOTE — Assessment & Plan Note (Signed)
Blood pressure controlled. Continue present medications. Potassium and renal function followed by primary care. 

## 2011-08-07 ENCOUNTER — Ambulatory Visit (HOSPITAL_COMMUNITY): Payer: Medicare Other | Attending: Cardiology | Admitting: Radiology

## 2011-08-07 VITALS — BP 121/76 | Ht 68.0 in | Wt 162.0 lb

## 2011-08-07 DIAGNOSIS — I2510 Atherosclerotic heart disease of native coronary artery without angina pectoris: Secondary | ICD-10-CM | POA: Insufficient documentation

## 2011-08-07 DIAGNOSIS — E785 Hyperlipidemia, unspecified: Secondary | ICD-10-CM | POA: Insufficient documentation

## 2011-08-07 DIAGNOSIS — R0602 Shortness of breath: Secondary | ICD-10-CM | POA: Diagnosis not present

## 2011-08-07 DIAGNOSIS — R0989 Other specified symptoms and signs involving the circulatory and respiratory systems: Secondary | ICD-10-CM | POA: Insufficient documentation

## 2011-08-07 DIAGNOSIS — R0600 Dyspnea, unspecified: Secondary | ICD-10-CM

## 2011-08-07 DIAGNOSIS — R0609 Other forms of dyspnea: Secondary | ICD-10-CM | POA: Insufficient documentation

## 2011-08-07 DIAGNOSIS — I10 Essential (primary) hypertension: Secondary | ICD-10-CM | POA: Insufficient documentation

## 2011-08-07 DIAGNOSIS — Z87891 Personal history of nicotine dependence: Secondary | ICD-10-CM | POA: Diagnosis not present

## 2011-08-07 DIAGNOSIS — I252 Old myocardial infarction: Secondary | ICD-10-CM | POA: Diagnosis not present

## 2011-08-07 MED ORDER — REGADENOSON 0.4 MG/5ML IV SOLN
0.4000 mg | Freq: Once | INTRAVENOUS | Status: AC
  Administered 2011-08-07: 0.4 mg via INTRAVENOUS

## 2011-08-07 MED ORDER — TECHNETIUM TC 99M TETROFOSMIN IV KIT
33.0000 | PACK | Freq: Once | INTRAVENOUS | Status: AC | PRN
  Administered 2011-08-07: 33 via INTRAVENOUS

## 2011-08-07 MED ORDER — TECHNETIUM TC 99M TETROFOSMIN IV KIT
11.0000 | PACK | Freq: Once | INTRAVENOUS | Status: AC | PRN
  Administered 2011-08-07: 11 via INTRAVENOUS

## 2011-08-07 NOTE — Progress Notes (Signed)
[facility] SITE 3 NUCLEAR MED [address] [place] [address] [phone]  Cardiology Nuclear Med Study  Shawn Sanchez [name] is a 78 y.o. male     MRN : [id number]     DOB: 06/16/1931  Procedure Date: 08/07/2011  Nuclear Med Background Indication for Stress Test:  Evaluation for Ischemia and Stent Patency History:  '06 MI: IWMI -Stent-RCA/LAD, '08 MPS: NL EF: 63%, 12/09 Heart Cath: N/O CAD Patent Stents EF: 60-65%  Cardiac Risk Factors: History of Smoking, Hypertension and Lipids  Symptoms:  DOE and SOB   Nuclear Pre-Procedure Caffeine/Decaff Intake:  None NPO After: 6:00pm   Lungs:  clear O2 Sat: 96% on room air. IV 0.9% NS with Angio Cath:  20g  IV Site: R Antecubital  IV Started by:  Stanton Kidney, EMT-P  Chest Size (in):  42 Cup Size: n/a  Height: 5\' 8"  (1.727 m)  Weight:  162 lb (73.483 kg)  BMI:  Body mass index is 24.63 kg/(m^2). Tech Comments:  n/a    Nuclear Med Study 1 or 2 day study: 1 day  Stress Test Type:  Treadmill/Lexiscan  Reading MD: Marca Ancona, MD  Order Authorizing Provider:  B.Crenshaw  Resting Radionuclide: Technetium 1m Tetrofosmin  Resting Radionuclide Dose: 10.4 mCi   Stress Radionuclide:  Technetium 59m Tetrofosmin  Stress Radionuclide Dose: 30.8 mCi           Stress Protocol Rest HR: 50 Stress HR: 97  Rest BP: 121/76 Stress BP: 146/77  Exercise Time (min): 8:23 METS: 8.0   Predicted Max HR: 142 bpm % Max HR: 68.31 bpm Rate Pressure Product: 52841   Dose of Adenosine (mg):  n/a Dose of Lexiscan: 0.4 mg  Dose of Atropine (mg): n/a Dose of Dobutamine: n/a mcg/kg/min (at max HR)  Stress Test Technologist: Milana Na, EMT-P  Nuclear Technologist:  Domenic Polite, CNMT     Rest Procedure:  Myocardial perfusion imaging was performed at rest 45 minutes following the intravenous administration of Technetium 44m Tetrofosmin. Rest ECG: Sinus Bradycardia  Stress Procedure:  The patient received IV Lexiscan 0.4 mg over  15-seconds with concurrent low level exercise and then Technetium 38m Tetrofosmin was injected at 30-seconds while the patient continued walking one more minute. There were no significant changes, + sob, and a rare pvc with Lexiscan. Quantitative spect images were obtained after a 45-minute delay. Stress ECG: No significant change from baseline ECG  QPS Raw Data Images:  Normal; no motion artifact; normal heart/lung ratio. Stress Images:  Normal homogeneous uptake in all areas of the myocardium. Rest Images:  Normal homogeneous uptake in all areas of the myocardium. Subtraction (SDS):  There is no evidence of scar or ischemia. Transient Ischemic Dilatation (Normal <1.22):  1.06 Lung/Heart Ratio (Normal <0.45):  0.34  Quantitative Gated Spect Images QGS EDV:  97 ml QGS ESV:  32 ml  Impression Exercise Capacity:  Good exercise capacity.  However, Lexiscan was given due to inadequate HR response.  BP Response:  Normal blood pressure response. Clinical Symptoms:  Shortness of breath with Lexiscan. ECG Impression:  Insignificant upsloping ST segment depression. Comparison with Prior Nuclear Study: No images to compare  Overall Impression:  Normal stress nuclear study.  LV Ejection Fraction: 67%.  LV Wall Motion:  NL LV Function; NL Wall Motion  Marca Ancona 08/07/2011

## 2011-08-09 ENCOUNTER — Other Ambulatory Visit: Payer: Self-pay | Admitting: Cardiology

## 2011-10-11 DIAGNOSIS — Z23 Encounter for immunization: Secondary | ICD-10-CM | POA: Diagnosis not present

## 2011-10-28 ENCOUNTER — Other Ambulatory Visit: Payer: Self-pay | Admitting: *Deleted

## 2011-10-28 MED ORDER — CARVEDILOL 12.5 MG PO TABS: 12.5000 mg | ORAL_TABLET | Freq: Two times a day (BID) | ORAL | Status: DC

## 2011-11-07 ENCOUNTER — Encounter: Payer: Self-pay | Admitting: Cardiology

## 2011-11-07 DIAGNOSIS — Z1331 Encounter for screening for depression: Secondary | ICD-10-CM | POA: Diagnosis not present

## 2011-11-07 DIAGNOSIS — E785 Hyperlipidemia, unspecified: Secondary | ICD-10-CM | POA: Diagnosis not present

## 2011-11-07 DIAGNOSIS — N401 Enlarged prostate with lower urinary tract symptoms: Secondary | ICD-10-CM | POA: Diagnosis present

## 2011-11-07 DIAGNOSIS — E039 Hypothyroidism, unspecified: Secondary | ICD-10-CM | POA: Diagnosis not present

## 2011-11-07 DIAGNOSIS — R5383 Other fatigue: Secondary | ICD-10-CM | POA: Diagnosis not present

## 2011-11-07 DIAGNOSIS — R5381 Other malaise: Secondary | ICD-10-CM | POA: Diagnosis not present

## 2012-05-15 DIAGNOSIS — F341 Dysthymic disorder: Secondary | ICD-10-CM | POA: Diagnosis not present

## 2012-05-15 DIAGNOSIS — IMO0002 Reserved for concepts with insufficient information to code with codable children: Secondary | ICD-10-CM | POA: Diagnosis not present

## 2012-05-15 DIAGNOSIS — N401 Enlarged prostate with lower urinary tract symptoms: Secondary | ICD-10-CM | POA: Diagnosis not present

## 2012-05-15 DIAGNOSIS — Z0000 Encounter for general adult medical examination without abnormal findings: Secondary | ICD-10-CM | POA: Diagnosis not present

## 2012-05-15 DIAGNOSIS — H8110 Benign paroxysmal vertigo, unspecified ear: Secondary | ICD-10-CM | POA: Diagnosis not present

## 2012-05-15 DIAGNOSIS — R5381 Other malaise: Secondary | ICD-10-CM | POA: Diagnosis not present

## 2012-05-15 DIAGNOSIS — I10 Essential (primary) hypertension: Secondary | ICD-10-CM | POA: Diagnosis not present

## 2012-05-15 DIAGNOSIS — R413 Other amnesia: Secondary | ICD-10-CM | POA: Diagnosis not present

## 2012-05-15 DIAGNOSIS — R7309 Other abnormal glucose: Secondary | ICD-10-CM | POA: Diagnosis not present

## 2012-05-15 DIAGNOSIS — G4700 Insomnia, unspecified: Secondary | ICD-10-CM | POA: Diagnosis not present

## 2012-05-28 DIAGNOSIS — Z1212 Encounter for screening for malignant neoplasm of rectum: Secondary | ICD-10-CM | POA: Diagnosis not present

## 2012-06-01 DIAGNOSIS — M25529 Pain in unspecified elbow: Secondary | ICD-10-CM | POA: Diagnosis not present

## 2012-06-01 DIAGNOSIS — M779 Enthesopathy, unspecified: Secondary | ICD-10-CM | POA: Diagnosis not present

## 2012-06-01 DIAGNOSIS — Z6825 Body mass index (BMI) 25.0-25.9, adult: Secondary | ICD-10-CM | POA: Diagnosis not present

## 2012-06-01 DIAGNOSIS — I10 Essential (primary) hypertension: Secondary | ICD-10-CM | POA: Diagnosis not present

## 2012-06-04 DIAGNOSIS — M48061 Spinal stenosis, lumbar region without neurogenic claudication: Secondary | ICD-10-CM | POA: Diagnosis not present

## 2012-06-29 DIAGNOSIS — R3915 Urgency of urination: Secondary | ICD-10-CM | POA: Diagnosis not present

## 2012-06-29 DIAGNOSIS — M48061 Spinal stenosis, lumbar region without neurogenic claudication: Secondary | ICD-10-CM | POA: Diagnosis not present

## 2012-06-29 DIAGNOSIS — N529 Male erectile dysfunction, unspecified: Secondary | ICD-10-CM | POA: Diagnosis not present

## 2012-06-29 DIAGNOSIS — N400 Enlarged prostate without lower urinary tract symptoms: Secondary | ICD-10-CM | POA: Diagnosis not present

## 2012-06-29 DIAGNOSIS — N3941 Urge incontinence: Secondary | ICD-10-CM | POA: Diagnosis not present

## 2012-07-01 ENCOUNTER — Other Ambulatory Visit: Payer: Self-pay

## 2012-07-01 MED ORDER — SIMVASTATIN 20 MG PO TABS: 20.0000 mg | ORAL_TABLET | Freq: Every day | ORAL | Status: DC

## 2012-07-29 ENCOUNTER — Encounter: Payer: Self-pay | Admitting: Cardiology

## 2012-08-13 ENCOUNTER — Ambulatory Visit: Payer: Medicare Other | Admitting: Cardiology

## 2012-08-18 ENCOUNTER — Other Ambulatory Visit: Payer: Self-pay | Admitting: Cardiology

## 2012-09-23 DIAGNOSIS — M48061 Spinal stenosis, lumbar region without neurogenic claudication: Secondary | ICD-10-CM | POA: Diagnosis not present

## 2012-09-25 ENCOUNTER — Other Ambulatory Visit: Payer: Self-pay

## 2012-09-25 MED ORDER — ISOSORBIDE MONONITRATE ER 60 MG PO TB24: ORAL_TABLET | ORAL | Status: DC

## 2012-09-28 ENCOUNTER — Other Ambulatory Visit: Payer: Self-pay | Admitting: Orthopedic Surgery

## 2012-09-28 DIAGNOSIS — M48061 Spinal stenosis, lumbar region without neurogenic claudication: Secondary | ICD-10-CM

## 2012-10-05 ENCOUNTER — Ambulatory Visit: Payer: Medicare Other | Admitting: Cardiology

## 2012-10-09 ENCOUNTER — Ambulatory Visit
Admission: RE | Admit: 2012-10-09 | Discharge: 2012-10-09 | Disposition: A | Discharge status: Home / Self Care | Payer: Self-pay | Source: Ambulatory Visit | Attending: Orthopedic Surgery | Admitting: Orthopedic Surgery

## 2012-10-09 VITALS — BP 133/76 | HR 57 | Ht 68.0 in | Wt 158.0 lb

## 2012-10-09 DIAGNOSIS — M48061 Spinal stenosis, lumbar region without neurogenic claudication: Secondary | ICD-10-CM

## 2012-10-09 MED ORDER — MEPERIDINE HCL 100 MG/ML IJ SOLN
75.0000 mg | Freq: Once | INTRAMUSCULAR | Status: AC
  Administered 2012-10-09: 75 mg via INTRAMUSCULAR

## 2012-10-09 MED ORDER — ONDANSETRON HCL 4 MG/2ML IJ SOLN
4.0000 mg | Freq: Once | INTRAMUSCULAR | Status: AC
  Administered 2012-10-09: 4 mg via INTRAMUSCULAR

## 2012-10-09 MED ORDER — IOHEXOL 180 MG/ML  SOLN
15.0000 mL | Freq: Once | INTRAMUSCULAR | Status: AC | PRN
  Administered 2012-10-09: 15 mL via INTRATHECAL

## 2012-10-09 MED ORDER — DIAZEPAM 5 MG PO TABS
5.0000 mg | ORAL_TABLET | Freq: Once | ORAL | Status: AC
  Administered 2012-10-09: 5 mg via ORAL

## 2012-10-16 ENCOUNTER — Encounter: Payer: Self-pay | Admitting: Cardiology

## 2012-10-20 ENCOUNTER — Encounter: Payer: Self-pay | Admitting: Cardiology

## 2012-10-20 ENCOUNTER — Ambulatory Visit (INDEPENDENT_AMBULATORY_CARE_PROVIDER_SITE_OTHER): Payer: Medicare Other | Admitting: Cardiology

## 2012-10-20 VITALS — BP 115/69 | HR 65 | Ht 68.0 in | Wt 167.0 lb

## 2012-10-20 DIAGNOSIS — I2510 Atherosclerotic heart disease of native coronary artery without angina pectoris: Secondary | ICD-10-CM

## 2012-10-20 DIAGNOSIS — E785 Hyperlipidemia, unspecified: Secondary | ICD-10-CM

## 2012-10-20 DIAGNOSIS — I10 Essential (primary) hypertension: Secondary | ICD-10-CM

## 2012-10-20 NOTE — Progress Notes (Signed)
HPI:  fu coronary artery disease. 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 He underwent placement of Cypher drug-eluting stent to the distal right coronary artery and the mid LAD. At that time, ejection fraction was normal. Cath 12/09 with nonobstructive CAD and patent stents. 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 Echo 5/12 EF 55-60% grade 1 diastolic dysfunction. Normal RV. Nuclear study in July of 2013 showed normal perfusion and an ejection fraction of 67%. Last seen 7/13. Since then, there is no dyspnea, chest pain or syncope.   Current Outpatient Prescriptions  Medication Sig Dispense Refill  . aspirin 81 MG EC tablet Take 81 mg by mouth daily.        . carvedilol (COREG) 12.5 MG tablet Take 1 tablet (12.5 mg total) by mouth 2 (two) times daily with a meal.  60 tablet  9  . hydrocortisone 2.5 % cream Apply 1 application topically as directed.       . isosorbide mononitrate (IMDUR) 60 MG 24 hr tablet TAKE ONE TABLET BY MOUTH ONE TIME DAILY  30 tablet  0  . levothyroxine (SYNTHROID, LEVOTHROID) 50 MCG tablet Take 50 mcg by mouth daily.        . Melatonin 300 MCG TABS Take by mouth daily. 5 MG      . Omega-3 Fatty Acids (FISH OIL) 1000 MG CAPS Take by mouth daily.        Marland Kitchen omeprazole (PRILOSEC) 20 MG capsule Take 20 mg by mouth daily.       . Tamsulosin HCl (FLOMAX) 0.4 MG CAPS 0.4 mg daily.        No current facility-administered medications for this visit.     Past Medical History  Diagnosis Date  . Unspecified disorder of kidney and ureter   . Anemia, unspecified   . Hemorrhage of rectum and anus   . Unspecified essential hypertension   . Carpal tunnel syndrome   . Coronary atherosclerosis of unspecified type of vessel, native or graft   . Hyperchylomicronemia   . Esophageal reflux     Past Surgical History  Procedure Laterality Date  . Angioplasy/stent  2006  . Lamenectomy  1965    History   Social  History  . Marital Status: Married    Spouse Name: N/A    Number of Children: N/A  . Years of Education: N/A   Occupational History  . Not on file.   Social History Main Topics  . Smoking status: Former Smoker    Types: Pipe, Cigars    Quit date: 10/10/1999  . Smokeless tobacco: Never Used     Comment: cigars only in 2005  . Alcohol Use: Yes     Comment: Scotch   . Drug Use: No  . Sexual Activity: Not on file   Other Topics Concern  . Not on file   Social History Narrative   ** Merged History Encounter **       Retired; divorced. Daily Caffeine use- 6-7 cups. Pt does not get regular exercise.     ROS: occasional dizziness with certain movements and numbness in right lower extremity but no fevers or chills, productive cough, hemoptysis, dysphasia, odynophagia, melena, hematochezia, dysuria, hematuria, rash, seizure activity, orthopnea, PND, pedal edema, claudication. Remaining systems are negative.  Physical Exam: Well-developed well-nourished in no acute distress.  Skin is warm and dry.  HEENT is normal.  Neck is supple.  Chest is clear to auscultation with normal  expansion.  Cardiovascular exam is regular rate and rhythm.  Abdominal exam nontender or distended. No masses palpated. Extremities show no edema. neuro grossly intact  ECG sinus rhythm at a rate of 65. No ST changes

## 2012-10-20 NOTE — Assessment & Plan Note (Signed)
Patient previously discontinued his statin as he felt it was contributing to weakness. We discussed the benefits of statins today in patients with coronary artery disease. He will consider this and contact us if he is willing to resume.

## 2012-10-20 NOTE — Patient Instructions (Addendum)
Your physician wants you to follow-up in: ONE YEAR WITH DR CRENSHAW You will receive a reminder letter in the mail two months in advance. If you don't receive a letter, please call our office to schedule the follow-up appointment.  

## 2012-10-20 NOTE — Assessment & Plan Note (Signed)
Continue aspirin. Previous nuclear study negative.

## 2012-10-20 NOTE — Assessment & Plan Note (Signed)
Blood pressure controlled. Continue present medications. 

## 2012-10-21 ENCOUNTER — Other Ambulatory Visit: Payer: Self-pay | Admitting: Cardiology

## 2012-10-26 ENCOUNTER — Other Ambulatory Visit: Payer: Self-pay | Admitting: Cardiology

## 2012-11-09 DIAGNOSIS — E785 Hyperlipidemia, unspecified: Secondary | ICD-10-CM | POA: Diagnosis not present

## 2012-11-09 DIAGNOSIS — Z1331 Encounter for screening for depression: Secondary | ICD-10-CM | POA: Diagnosis not present

## 2012-11-09 DIAGNOSIS — R413 Other amnesia: Secondary | ICD-10-CM | POA: Diagnosis not present

## 2012-11-09 DIAGNOSIS — I252 Old myocardial infarction: Secondary | ICD-10-CM | POA: Diagnosis not present

## 2012-11-09 DIAGNOSIS — I2510 Atherosclerotic heart disease of native coronary artery without angina pectoris: Secondary | ICD-10-CM | POA: Diagnosis not present

## 2012-11-09 DIAGNOSIS — IMO0002 Reserved for concepts with insufficient information to code with codable children: Secondary | ICD-10-CM | POA: Diagnosis not present

## 2012-11-09 DIAGNOSIS — E039 Hypothyroidism, unspecified: Secondary | ICD-10-CM | POA: Diagnosis not present

## 2012-11-09 DIAGNOSIS — I10 Essential (primary) hypertension: Secondary | ICD-10-CM | POA: Diagnosis not present

## 2012-11-19 ENCOUNTER — Other Ambulatory Visit: Payer: Self-pay | Admitting: Internal Medicine

## 2012-11-19 ENCOUNTER — Ambulatory Visit
Admission: RE | Admit: 2012-11-19 | Discharge: 2012-11-19 | Disposition: A | Discharge status: Home / Self Care | Payer: Medicare Other | Source: Ambulatory Visit | Attending: Internal Medicine | Admitting: Internal Medicine

## 2012-11-19 DIAGNOSIS — F29 Unspecified psychosis not due to a substance or known physiological condition: Secondary | ICD-10-CM | POA: Diagnosis not present

## 2012-11-19 DIAGNOSIS — I2510 Atherosclerotic heart disease of native coronary artery without angina pectoris: Secondary | ICD-10-CM | POA: Diagnosis not present

## 2012-11-19 DIAGNOSIS — R42 Dizziness and giddiness: Secondary | ICD-10-CM

## 2012-11-19 DIAGNOSIS — R269 Unspecified abnormalities of gait and mobility: Secondary | ICD-10-CM

## 2012-11-19 DIAGNOSIS — M48061 Spinal stenosis, lumbar region without neurogenic claudication: Secondary | ICD-10-CM | POA: Diagnosis not present

## 2012-11-30 ENCOUNTER — Other Ambulatory Visit: Payer: Self-pay | Admitting: Cardiology

## 2012-12-08 DIAGNOSIS — M48061 Spinal stenosis, lumbar region without neurogenic claudication: Secondary | ICD-10-CM | POA: Diagnosis not present

## 2012-12-21 DIAGNOSIS — R269 Unspecified abnormalities of gait and mobility: Secondary | ICD-10-CM | POA: Diagnosis not present

## 2012-12-21 DIAGNOSIS — I252 Old myocardial infarction: Secondary | ICD-10-CM | POA: Diagnosis not present

## 2012-12-21 DIAGNOSIS — E039 Hypothyroidism, unspecified: Secondary | ICD-10-CM | POA: Diagnosis not present

## 2012-12-21 DIAGNOSIS — E785 Hyperlipidemia, unspecified: Secondary | ICD-10-CM | POA: Diagnosis not present

## 2012-12-21 DIAGNOSIS — Z6825 Body mass index (BMI) 25.0-25.9, adult: Secondary | ICD-10-CM | POA: Diagnosis not present

## 2012-12-21 DIAGNOSIS — R413 Other amnesia: Secondary | ICD-10-CM | POA: Diagnosis not present

## 2013-03-18 DIAGNOSIS — H25019 Cortical age-related cataract, unspecified eye: Secondary | ICD-10-CM | POA: Diagnosis not present

## 2013-03-18 DIAGNOSIS — H2510 Age-related nuclear cataract, unspecified eye: Secondary | ICD-10-CM | POA: Diagnosis not present

## 2013-03-18 DIAGNOSIS — H02839 Dermatochalasis of unspecified eye, unspecified eyelid: Secondary | ICD-10-CM | POA: Diagnosis not present

## 2013-05-11 DIAGNOSIS — E785 Hyperlipidemia, unspecified: Secondary | ICD-10-CM | POA: Diagnosis not present

## 2013-05-11 DIAGNOSIS — Z125 Encounter for screening for malignant neoplasm of prostate: Secondary | ICD-10-CM | POA: Diagnosis not present

## 2013-05-11 DIAGNOSIS — R7309 Other abnormal glucose: Secondary | ICD-10-CM | POA: Diagnosis not present

## 2013-05-11 DIAGNOSIS — I10 Essential (primary) hypertension: Secondary | ICD-10-CM | POA: Diagnosis not present

## 2013-05-11 DIAGNOSIS — R82998 Other abnormal findings in urine: Secondary | ICD-10-CM | POA: Diagnosis not present

## 2013-05-11 DIAGNOSIS — I2510 Atherosclerotic heart disease of native coronary artery without angina pectoris: Secondary | ICD-10-CM | POA: Diagnosis not present

## 2013-05-11 DIAGNOSIS — E039 Hypothyroidism, unspecified: Secondary | ICD-10-CM | POA: Diagnosis not present

## 2013-05-18 DIAGNOSIS — E039 Hypothyroidism, unspecified: Secondary | ICD-10-CM | POA: Diagnosis not present

## 2013-05-18 DIAGNOSIS — F341 Dysthymic disorder: Secondary | ICD-10-CM | POA: Diagnosis not present

## 2013-05-18 DIAGNOSIS — N138 Other obstructive and reflux uropathy: Secondary | ICD-10-CM | POA: Diagnosis not present

## 2013-05-18 DIAGNOSIS — IMO0002 Reserved for concepts with insufficient information to code with codable children: Secondary | ICD-10-CM | POA: Diagnosis not present

## 2013-05-18 DIAGNOSIS — N401 Enlarged prostate with lower urinary tract symptoms: Secondary | ICD-10-CM | POA: Diagnosis not present

## 2013-05-18 DIAGNOSIS — I252 Old myocardial infarction: Secondary | ICD-10-CM | POA: Diagnosis not present

## 2013-05-18 DIAGNOSIS — Z0000 Encounter for general adult medical examination without abnormal findings: Secondary | ICD-10-CM | POA: Diagnosis not present

## 2013-05-18 DIAGNOSIS — G4700 Insomnia, unspecified: Secondary | ICD-10-CM | POA: Diagnosis not present

## 2013-05-18 DIAGNOSIS — F332 Major depressive disorder, recurrent severe without psychotic features: Secondary | ICD-10-CM | POA: Diagnosis not present

## 2013-05-20 DIAGNOSIS — Z1212 Encounter for screening for malignant neoplasm of rectum: Secondary | ICD-10-CM | POA: Diagnosis not present

## 2013-09-02 ENCOUNTER — Telehealth: Payer: Self-pay | Admitting: Cardiology

## 2013-09-02 ENCOUNTER — Emergency Department (HOSPITAL_COMMUNITY)
Admission: EM | Admit: 2013-09-02 | Discharge: 2013-09-02 | Disposition: A | Discharge status: Home / Self Care | Payer: Medicare Other | Attending: Emergency Medicine | Admitting: Emergency Medicine

## 2013-09-02 ENCOUNTER — Emergency Department (HOSPITAL_COMMUNITY): Payer: Medicare Other

## 2013-09-02 ENCOUNTER — Encounter (HOSPITAL_COMMUNITY): Payer: Self-pay | Admitting: Emergency Medicine

## 2013-09-02 DIAGNOSIS — Z87891 Personal history of nicotine dependence: Secondary | ICD-10-CM | POA: Insufficient documentation

## 2013-09-02 DIAGNOSIS — Z7982 Long term (current) use of aspirin: Secondary | ICD-10-CM | POA: Diagnosis not present

## 2013-09-02 DIAGNOSIS — I959 Hypotension, unspecified: Secondary | ICD-10-CM | POA: Diagnosis not present

## 2013-09-02 DIAGNOSIS — Z8639 Personal history of other endocrine, nutritional and metabolic disease: Secondary | ICD-10-CM | POA: Insufficient documentation

## 2013-09-02 DIAGNOSIS — Z8669 Personal history of other diseases of the nervous system and sense organs: Secondary | ICD-10-CM | POA: Insufficient documentation

## 2013-09-02 DIAGNOSIS — Z79899 Other long term (current) drug therapy: Secondary | ICD-10-CM | POA: Diagnosis not present

## 2013-09-02 DIAGNOSIS — G459 Transient cerebral ischemic attack, unspecified: Secondary | ICD-10-CM | POA: Diagnosis not present

## 2013-09-02 DIAGNOSIS — Z9861 Coronary angioplasty status: Secondary | ICD-10-CM | POA: Diagnosis not present

## 2013-09-02 DIAGNOSIS — R51 Headache: Secondary | ICD-10-CM | POA: Diagnosis not present

## 2013-09-02 DIAGNOSIS — Z862 Personal history of diseases of the blood and blood-forming organs and certain disorders involving the immune mechanism: Secondary | ICD-10-CM | POA: Insufficient documentation

## 2013-09-02 DIAGNOSIS — Z8719 Personal history of other diseases of the digestive system: Secondary | ICD-10-CM | POA: Insufficient documentation

## 2013-09-02 DIAGNOSIS — G458 Other transient cerebral ischemic attacks and related syndromes: Secondary | ICD-10-CM | POA: Diagnosis not present

## 2013-09-02 DIAGNOSIS — I10 Essential (primary) hypertension: Secondary | ICD-10-CM | POA: Diagnosis not present

## 2013-09-02 DIAGNOSIS — Z87448 Personal history of other diseases of urinary system: Secondary | ICD-10-CM | POA: Insufficient documentation

## 2013-09-02 LAB — CBC
HEMATOCRIT: 35.2 % — AB (ref 39.0–52.0)
Hemoglobin: 12 g/dL — ABNORMAL LOW (ref 13.0–17.0)
MCH: 32.7 pg (ref 26.0–34.0)
MCHC: 34.1 g/dL (ref 30.0–36.0)
MCV: 95.9 fL (ref 78.0–100.0)
Platelets: 168 10*3/uL (ref 150–400)
RBC: 3.67 MIL/uL — ABNORMAL LOW (ref 4.22–5.81)
RDW: 12.3 % (ref 11.5–15.5)
WBC: 6.5 10*3/uL (ref 4.0–10.5)

## 2013-09-02 LAB — I-STAT TROPONIN, ED: TROPONIN I, POC: 0 ng/mL (ref 0.00–0.08)

## 2013-09-02 LAB — COMPREHENSIVE METABOLIC PANEL
ALBUMIN: 3.6 g/dL (ref 3.5–5.2)
ALT: 27 U/L (ref 0–53)
ANION GAP: 14 (ref 5–15)
AST: 28 U/L (ref 0–37)
Alkaline Phosphatase: 83 U/L (ref 39–117)
BUN: 19 mg/dL (ref 6–23)
CALCIUM: 8.9 mg/dL (ref 8.4–10.5)
CO2: 23 mEq/L (ref 19–32)
Chloride: 102 mEq/L (ref 96–112)
Creatinine, Ser: 1.52 mg/dL — ABNORMAL HIGH (ref 0.50–1.35)
GFR calc non Af Amer: 42 mL/min — ABNORMAL LOW (ref >90)
GFR, EST AFRICAN AMERICAN: 48 mL/min — AB (ref >90)
Glucose, Bld: 140 mg/dL — ABNORMAL HIGH (ref 70–99)
Potassium: 4.6 mEq/L (ref 3.7–5.3)
Sodium: 139 mEq/L (ref 137–147)
TOTAL PROTEIN: 6.2 g/dL (ref 6.0–8.3)
Total Bilirubin: 0.3 mg/dL (ref 0.3–1.2)

## 2013-09-02 LAB — DIFFERENTIAL
BASOS PCT: 1 % (ref 0–1)
Basophils Absolute: 0 10*3/uL (ref 0.0–0.1)
EOS ABS: 0.3 10*3/uL (ref 0.0–0.7)
EOS PCT: 5 % (ref 0–5)
LYMPHS ABS: 1.9 10*3/uL (ref 0.7–4.0)
Lymphocytes Relative: 30 % (ref 12–46)
Monocytes Absolute: 0.7 10*3/uL (ref 0.1–1.0)
Monocytes Relative: 10 % (ref 3–12)
Neutro Abs: 3.6 10*3/uL (ref 1.7–7.7)
Neutrophils Relative %: 54 % (ref 43–77)

## 2013-09-02 LAB — APTT: APTT: 31 s (ref 24–37)

## 2013-09-02 LAB — PROTIME-INR
INR: 1.08 (ref 0.00–1.49)
Prothrombin Time: 14 seconds (ref 11.6–15.2)

## 2013-09-02 MED ORDER — CLOPIDOGREL BISULFATE 75 MG PO TABS
75.0000 mg | ORAL_TABLET | Freq: Once | ORAL | Status: AC
  Administered 2013-09-02: 75 mg via ORAL
  Filled 2013-09-02: qty 1

## 2013-09-02 MED ORDER — SODIUM CHLORIDE 0.9 % IV BOLUS (SEPSIS)
1000.0000 mL | Freq: Once | INTRAVENOUS | Status: AC
  Administered 2013-09-02: 1000 mL via INTRAVENOUS

## 2013-09-02 NOTE — Consult Note (Signed)
Reason for Consult:tia symptoms Referring Physician: Dr. Doy Mince ER doctor  Shawn Sanchez is an 80 y.o. male.  HPI: Merit is a pleasant gentleman with medical hx as below.  Yesterday he had sudden onset of trouble reading and some trouble speaking. This lasted for several minutes.  He thinks things were not right for about an hour or maybe 30 minutes. This resolved.  He then had a dull headache for a few hours.  Later in the day he felt fine.  He felt fine overnight.  Today he called his cardiology office and they aDVISED him to come to the ER.  In the ER he states that he is at his baseline.  ER evaluation shows no sign of afib and cct is negative.  No fevers, no cp,  No bowel or bladder issues.  No blood noted.  Past Medical History  Diagnosis Date  . Unspecified disorder of kidney and ureter   . Anemia, unspecified   . Hemorrhage of rectum and anus   . Unspecified essential hypertension   . Carpal tunnel syndrome   . Coronary atherosclerosis of unspecified type of vessel, native or graft   . Hyperchylomicronemia   . Esophageal reflux     Past Surgical History  Procedure Laterality Date  . Angioplasy/stent  2006  . Lamenectomy  1965  Carpal tunnel surgery bilat. hands  Family History  Problem Relation Age of Onset  . Breast cancer Mother   . Lung cancer Mother   . Diabetes Sister   . Lung cancer Father   . Coronary artery disease Father   . Hypertension Father   . Colon cancer Neg Hx     Social History:  reports that he quit smoking about 13 years ago. His smoking use included Pipe and Cigars. He has never used smokeless tobacco. He reports that he drinks alcohol. He reports that he does not use illicit drugs.  Allergies:  Allergies  Allergen Reactions  . Novocain [Procaine] Other (See Comments)    With epi---chest pain    Medications: carvedilol  3.125 mg bid Isosorbide 60 mg Omeprazole 20 mg tamsulosin  0.4  mg Synthroid  50 mcg Asa 81 mg Melatonin Vit d 1000  iu Fish oil daily escitalopram 10 mg daily Simvastatin 20 mg qhs  Results for orders placed during the hospital encounter of 09/02/13 (from the past 48 hour(s))  PROTIME-INR     Status: None   Collection Time    09/02/13  3:21 PM      Result Value Ref Range   Prothrombin Time 14.0  11.6 - 15.2 seconds   INR 1.08  0.00 - 1.49  APTT     Status: None   Collection Time    09/02/13  3:21 PM      Result Value Ref Range   aPTT 31  24 - 37 seconds  CBC     Status: Abnormal   Collection Time    09/02/13  3:21 PM      Result Value Ref Range   WBC 6.5  4.0 - 10.5 K/uL   RBC 3.67 (*) 4.22 - 5.81 MIL/uL   Hemoglobin 12.0 (*) 13.0 - 17.0 g/dL   HCT 35.2 (*) 39.0 - 52.0 %   MCV 95.9  78.0 - 100.0 fL   MCH 32.7  26.0 - 34.0 pg   MCHC 34.1  30.0 - 36.0 g/dL   RDW 12.3  11.5 - 15.5 %   Platelets 168  150 - 400 K/uL  DIFFERENTIAL     Status: None   Collection Time    09/02/13  3:21 PM      Result Value Ref Range   Neutrophils Relative % 54  43 - 77 %   Neutro Abs 3.6  1.7 - 7.7 K/uL   Lymphocytes Relative 30  12 - 46 %   Lymphs Abs 1.9  0.7 - 4.0 K/uL   Monocytes Relative 10  3 - 12 %   Monocytes Absolute 0.7  0.1 - 1.0 K/uL   Eosinophils Relative 5  0 - 5 %   Eosinophils Absolute 0.3  0.0 - 0.7 K/uL   Basophils Relative 1  0 - 1 %   Basophils Absolute 0.0  0.0 - 0.1 K/uL  COMPREHENSIVE METABOLIC PANEL     Status: Abnormal   Collection Time    09/02/13  3:21 PM      Result Value Ref Range   Sodium 139  137 - 147 mEq/L   Potassium 4.6  3.7 - 5.3 mEq/L   Chloride 102  96 - 112 mEq/L   CO2 23  19 - 32 mEq/L   Glucose, Bld 140 (*) 70 - 99 mg/dL   BUN 19  6 - 23 mg/dL   Creatinine, Ser 1.52 (*) 0.50 - 1.35 mg/dL   Calcium 8.9  8.4 - 10.5 mg/dL   Total Protein 6.2  6.0 - 8.3 g/dL   Albumin 3.6  3.5 - 5.2 g/dL   AST 28  0 - 37 U/L   ALT 27  0 - 53 U/L   Alkaline Phosphatase 83  39 - 117 U/L   Total Bilirubin 0.3  0.3 - 1.2 mg/dL   GFR calc non Af Amer 42 (*) >90 mL/min   GFR calc  Af Amer 48 (*) >90 mL/min   Comment: (NOTE)     The eGFR has been calculated using the CKD EPI equation.     This calculation has not been validated in all clinical situations.     eGFR's persistently <90 mL/min signify possible Chronic Kidney     Disease.   Anion gap 14  5 - 15  I-STAT TROPOININ, ED     Status: None   Collection Time    09/02/13  4:00 PM      Result Value Ref Range   Troponin i, poc 0.00  0.00 - 0.08 ng/mL   Comment 3            Comment: Due to the release kinetics of cTnI,     a negative result within the first hours     of the onset of symptoms does not rule out     myocardial infarction with certainty.     If myocardial infarction is still suspected,     repeat the test at appropriate intervals.    Ct Head Wo Contrast  09/02/2013   CLINICAL DATA:  Hypotension, stroke symptoms  EXAM: CT HEAD WITHOUT CONTRAST  TECHNIQUE: Contiguous axial images were obtained from the base of the skull through the vertex without intravenous contrast.  COMPARISON:  11/19/2012  FINDINGS: There is no evidence of mass effect, midline shift, or extra-axial fluid collections. There is no evidence of a space-occupying lesion or intracranial hemorrhage. There is no evidence of a cortical-based area of acute infarction. There is generalized cerebral atrophy. There is periventricular white matter low attenuation likely secondary to microangiopathy.  The ventricles and sulci are appropriate for the patient's age and degree of cerebral atrophy.  The basal cisterns are patent.  Visualized portions of the orbits are unremarkable. There is bilateral maxillary sinus and ethmoid sinus mucosal thickening. There is mild right frontal sinus mucosal thickening. There is mild sphenoid sinus mucosal thickening. Cerebrovascular atherosclerotic calcifications are noted.  The osseous structures are unremarkable.  IMPRESSION: No acute intracranial pathology.   Electronically Signed   By: Kathreen Devoid   On: 09/02/2013  16:16    ROS:as per hpi  Blood pressure 130/66, pulse 60, temperature 97.6 F (36.4 C), temperature source Oral, resp. rate 21, SpO2 97.00%.  PHYSICAL EXAM:  Age appropriate. No acute distress.  Oriented to person place and time.  No jvd. No pallor. No icterus. Lungs are cta bilat. No w/r/r.  Heart is rrr no m/r/g.  nsr on tele as well.  Abd: soft, nt, nd, no mass or hsm Moe times 4, grossly nL strength throughout,  Normal finger nose finger testing, no pronator drift.  Cn 2-12 nL. Speech is fluent and there is no dysarthria. 2 + dtr bilat ue and le   Assessment/Plan: Suspected Tia.  I have discussed with patient admission vs. Outpt. Evaluation.  He understands the risks of discharge from ER.  He strongly choses to go home this evening.  We will change his therapy from asa to plavix therapy.  He has an appt. In our office tomorrow at 11:20 a.m. And we will pursur further outpt w/up (mri, carotids, maybe echo) from there.  He knows to call 911 immediately if any recurrent tia or stroke sxs occur (i discussed this with him).  I also spoke with his daughter who lives in [place], [place]. And she will call him a few times this evening to check on him.    Sedro-Woolley A 09/02/2013, 7:25 PM

## 2013-09-02 NOTE — Telephone Encounter (Signed)
Spoke with pt, he had an episode yesterday where he lost his balance for a moment and felt dizzy. He went to his office and tried to read but was unable to read because his vision was out of focus. He had trouble speaking and went to write his name and could not remember how to write his name. He reports it lasted about one hour and went away. Today he has had no problems with balance or dizziness. He reports his thought processes are not right and he is weak and fatigued with little activity today. Explained to pt he needs to see his primary care or go to the ER for evaluation for stroke. Patient voiced understanding

## 2013-09-02 NOTE — Telephone Encounter (Signed)
Unable to reach pt or leave a message, phone is busy

## 2013-09-02 NOTE — ED Provider Notes (Signed)
CSN: [id number]     Arrival date & time 09/02/13  1503 History   First MD Initiated Contact with Patient 09/02/13 1520     Chief Complaint  Patient presents with  . Stroke Symptoms  . Hypotension     (Consider location/radiation/quality/duration/timing/severity/associated sxs/prior Treatment) HPI Comments: Patient is an 80 year old male with a past medical history of anemia, hypertension, CAD, MI and esophageal reflux who presents to the emergency department from home with concerns of stroke symptoms yesterday. Patient reports yesterday around 4:00 PM while he was working at [employer], he had a short episode of dizziness lasting only a few seconds when he took a step, resolving on its own. Shortly after, he went to write his name, and could not fully spell out his entire name. 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 States this only lasted a few minutes and have not returned. Patient reports he had a "very dull headache" from onset of symptoms until about bedtime. He called his cardiologist today to schedule his yearly appointment, he mentioned the symptoms that occurred yesterday and they advised patient to go directly to the emergency department. Patient currently denies confusion, headache, vision changes, dizziness, lightheadedness, chest pain, shortness of breath, neck pain, fever or chills. Denies history of stroke.  The history is provided by the patient.    Past Medical History  Diagnosis Date  . Unspecified disorder of kidney and ureter   . Anemia, unspecified   . Hemorrhage of rectum and anus   . Unspecified essential hypertension   . Carpal tunnel syndrome   . Coronary atherosclerosis of unspecified type of vessel, native or graft   . Hyperchylomicronemia   . Esophageal reflux    Past Surgical History  Procedure Laterality Date  . Angioplasy/stent  2006  . Lamenectomy  1965   Family History  Problem Relation Age of  Onset  . Breast cancer Mother   . Lung cancer Mother   . Diabetes Sister   . Lung cancer Father   . Coronary artery disease Father   . Hypertension Father   . Colon cancer Neg Hx    History  Substance Use Topics  . Smoking status: Former Smoker    Types: Pipe, Cigars    Quit date: 10/10/1999  . Smokeless tobacco: Never Used     Comment: cigars only in 2005  . Alcohol Use: Yes     Comment: Scotch     Review of Systems  Neurological: Positive for speech difficulty and headaches.  All other systems reviewed and are negative.     Allergies  Novocain  Home Medications   Prior to Admission medications   Medication Sig Start Date End Date Taking? Authorizing Provider  aspirin 81 MG EC tablet Take 81 mg by mouth daily.     Yes Historical Provider, MD  carvedilol (COREG) 12.5 MG tablet Take 1 tablet (12.5 mg total) by mouth 2 (two) times daily with a meal. 10/28/11  Yes Lewayne BuntingBrian S Crenshaw, MD  cholecalciferol (VITAMIN D) 1000 UNITS tablet Take 1,000 Units by mouth daily.   Yes Historical Provider, MD  escitalopram (LEXAPRO) 10 MG tablet Take 10 mg by mouth daily.   Yes Historical Provider, MD  isosorbide mononitrate (IMDUR) 60 MG 24 hr tablet Take 60 mg by mouth daily.   Yes Historical Provider, MD  levothyroxine (SYNTHROID, LEVOTHROID) 50 MCG tablet Take 50 mcg by mouth daily.     Yes  Historical Provider, MD  Melatonin 300 MCG TABS Take by mouth daily. 5 MG   Yes Historical Provider, MD  Omega-3 Fatty Acids (FISH OIL) 1000 MG CAPS Take by mouth daily.     Yes Historical Provider, MD  omeprazole (PRILOSEC) 20 MG capsule Take 20 mg by mouth daily.  09/25/12  Yes Historical Provider, MD  simvastatin (ZOCOR) 20 MG tablet Take 20 mg by mouth daily.   Yes Historical Provider, MD  Tamsulosin HCl (FLOMAX) 0.4 MG CAPS 0.4 mg daily.  07/20/11  Yes Historical Provider, MD   BP 150/68  Pulse 57  Temp(Src) 97.6 F (36.4 C) (Oral)  Resp 19  SpO2 99% Physical Exam  Nursing note and vitals  reviewed. Constitutional: He is oriented to person, place, and time. He appears well-developed and well-nourished. No distress.  HENT:  Head: Normocephalic and atraumatic.  Mouth/Throat: Oropharynx is clear and moist.  Eyes: Conjunctivae and EOM are normal. Pupils are equal, round, and reactive to light.  Neck: Normal range of motion. Neck supple. No JVD present.  Cardiovascular: Normal rate, regular rhythm, normal heart sounds and intact distal pulses.   No extremity edema.  Pulmonary/Chest: Effort normal and breath sounds normal. No respiratory distress.  Abdominal: Soft. Bowel sounds are normal. There is no tenderness.  Musculoskeletal: Normal range of motion. He exhibits no edema.  Neurological: He is alert and oriented to person, place, and time. He has normal strength. No cranial nerve deficit or sensory deficit. Coordination normal. GCS eye subscore is 4. GCS verbal subscore is 5. GCS motor subscore is 6.  Speech fluent, goal oriented. Moves limbs without ataxia. Equal grip strength bilateral.  Skin: Skin is warm and dry. He is not diaphoretic.  Psychiatric: He has a normal mood and affect. His behavior is normal.    ED Course  Procedures (including critical care time) Labs Review Labs Reviewed  CBC - Abnormal; Notable for the following:    RBC 3.67 (*)    Hemoglobin 12.0 (*)    HCT 35.2 (*)    All other components within normal limits  COMPREHENSIVE METABOLIC PANEL - Abnormal; Notable for the following:    Glucose, Bld 140 (*)    Creatinine, Ser 1.52 (*)    GFR calc non Af Amer 42 (*)    GFR calc Af Amer 48 (*)    All other components within normal limits  PROTIME-INR  APTT  DIFFERENTIAL  URINALYSIS, ROUTINE W REFLEX MICROSCOPIC  CBG MONITORING, ED  I-STAT TROPOININ, ED    Imaging Review Ct Head Wo Contrast  09/02/2013   CLINICAL DATA:  Hypotension, stroke symptoms  EXAM: CT HEAD WITHOUT CONTRAST  TECHNIQUE: Contiguous axial images were obtained from the base of  the skull through the vertex without intravenous contrast.  COMPARISON:  11/19/2012  FINDINGS: There is no evidence of mass effect, midline shift, or extra-axial fluid collections. There is no evidence of a space-occupying lesion or intracranial hemorrhage. There is no evidence of a cortical-based area of acute infarction. There is generalized cerebral atrophy. There is periventricular white matter low attenuation likely secondary to microangiopathy.  The ventricles and sulci are appropriate for the patient's age and degree of cerebral atrophy. The basal cisterns are patent.  Visualized portions of the orbits are unremarkable. There is bilateral maxillary sinus and ethmoid sinus mucosal thickening. There is mild right frontal sinus mucosal thickening. There is mild sphenoid sinus mucosal thickening. Cerebrovascular atherosclerotic calcifications are noted.  The osseous structures are unremarkable.  IMPRESSION: No acute  intracranial pathology.   Electronically Signed   By: Elige Ko   On: 09/02/2013 16:16     EKG Interpretation None      MDM   Final diagnoses:  Other specified transient cerebral ischemias  Hypotension, unspecified hypotension type   Patient presenting with an episode of speech difficulty and dizziness beginning greater than 12 hours ago, subsiding after a few minutes. He is well appearing and in no apparent distress. Afebrile, hypotensive at 81/51, vitals otherwise stable. Patient asymptomatic at this time. Will give IV fluids. Labs, head CT pending.  Labs without any acute finding. Head CT normal. Given pt's age and TIA symptoms, plan to admit pt for further workup. I spoke with Dr. Waynard Edwards with [facility] who did not feel admission was necessary given pt asymptomatic and negative workup. Advised switching pt to Plavix and he will schedule appt for pt tomorrow for further outpatient workup.  Pt evaluated by Dr. Loretha Stapler who again spoke with Dr. Waynard Edwards to suggest admission again. Dr.  Waynard Edwards came to ED to evaluate pt and still does not feel admission is necessary. Patient is would like to go home and is comfortable following up outpatient tomorrow. Plavix rx sent to pharmacy by Dr. Waynard Edwards. Pt stable for d/c home with close return precautions.  Trevor Mace, PA-C 09/02/13 2001

## 2013-09-02 NOTE — Telephone Encounter (Signed)
New problem:    Per pt yesterday evening pt's vision was not focused, his speech was slurred, could not spell this lasted for about an hour.   Pt would like to be seen please give him a call back.

## 2013-09-02 NOTE — ED Notes (Signed)
Presents with dizziness, inability to get words out correctly and half vision began yesterday at 4 pm while working at [employer], syptoms have resolved, pt denies dizziness and confusion, alert and oriented. Hypotensive 81/51. Denies headache. No drift or droop, speaking clearly.

## 2013-09-02 NOTE — Progress Notes (Signed)
Of note , no carotid bruits noted as well.

## 2013-09-02 NOTE — Discharge Instructions (Signed)
Start taking Plavix as prescribed by Dr. Waynard EdwardsPerini. Follow up with your primary care doctor's office tomorrow as there is an appointment scheduled for you. Return to the emergency department with any worsening symptoms.  Transient Ischemic Attack A transient ischemic attack (TIA) is a "warning stroke" that causes stroke-like symptoms. Unlike a stroke, a TIA does not cause permanent damage to the brain. The symptoms of a TIA can happen very fast and do not last long. It is important to know the symptoms of a TIA and what to do. This can help prevent a major stroke or death. CAUSES   A TIA is caused by a temporary blockage in an artery in the brain or neck (carotid artery). The blockage does not allow the brain to get the blood supply it needs and can cause different symptoms. The blockage can be caused by either:  A blood clot.  Fatty buildup (plaque) in a neck or brain artery. RISK FACTORS  High blood pressure (hypertension).  High cholesterol.  Diabetes mellitus.  Heart disease.  The build up of plaque in the blood vessels (peripheral artery disease or atherosclerosis).  The build up of plaque in the blood vessels providing blood and oxygen to the brain (carotid artery stenosis).  An abnormal heart rhythm (atrial fibrillation).  Obesity.  Smoking.  Taking oral contraceptives (especially in combination with smoking).  Physical inactivity.  A diet high in fats, salt (sodium), and calories.  Alcohol use.  Use of illegal drugs (especially cocaine and methamphetamine).  Being male.  Being African American.  Being over the age of 78.  Family history of stroke.  Previous history of blood clots, stroke, TIA, or heart attack.  Sickle cell disease. SYMPTOMS  TIA symptoms are the same as a stroke but are temporary. These symptoms usually develop suddenly, or may be newly present upon awakening from sleep:  Sudden weakness or numbness of the face, arm, or leg, especially on  one side of the body.  Sudden trouble walking or difficulty moving arms or legs.  Sudden confusion.  Sudden personality changes.  Trouble speaking (aphasia) or understanding.  Difficulty swallowing.  Sudden trouble seeing in one or both eyes.  Double vision.  Dizziness.  Loss of balance or coordination.  Sudden severe headache with no known cause.  Trouble reading or writing.  Loss of bowel or bladder control.  Loss of consciousness. DIAGNOSIS  Your caregiver may be able to determine the presence or absence of a TIA based on your symptoms, history, and physical exam. Computed tomography (CT scan) of the brain is usually performed to help identify a TIA. Other tests may be done to diagnose a TIA. These tests may include:  Electrocardiography.  Continuous heart monitoring.  Echocardiography.  Carotid ultrasonography.  Magnetic resonance imaging (MRI).  A scan of the brain circulation.  Blood tests. PREVENTION  The risk of a TIA can be decreased by appropriately treating high blood pressure, high cholesterol, diabetes, heart disease, and obesity and by quitting smoking, limiting alcohol, and staying physically active. TREATMENT  Time is of the essence. Since the symptoms of TIA are the same as a stroke, it is important to seek treatment as soon as possible because you may need a medicine to dissolve the clot (thrombolytic) that cannot be given if too much time has passed. Treatment options vary. Treatment options may include rest, oxygen, intravenous (IV) fluids, and medicines to thin the blood (anticoagulants). Medicines and diet may be used to address diabetes, high blood pressure, and  other risk factors. Measures will be taken to prevent short-term and long-term complications, including infection from breathing foreign material into the lungs (aspiration pneumonia), blood clots in the legs, and falls. Treatment options include procedures to either remove plaque in the  carotid arteries or dilate carotid arteries that have narrowed due to plaque. Those procedures are:  Carotid endarterectomy.  Carotid angioplasty and stenting. HOME CARE INSTRUCTIONS   Take all medicines prescribed by your caregiver. Follow the directions carefully. Medicines may be used to control risk factors for a stroke. Be sure you understand all your medicine instructions.  You may be told to take aspirin or the anticoagulant warfarin. Warfarin needs to be taken exactly as instructed.  Taking too much or too little warfarin is dangerous. Too much warfarin increases the risk of bleeding. Too little warfarin continues to allow the risk for blood clots. While taking warfarin, you will need to have regular blood tests to measure your blood clotting time. A PT blood test measures how long it takes for blood to clot. Your PT is used to calculate another value called an INR. Your PT and INR help your caregiver to adjust your dose of warfarin. The dose can change for many reasons. It is critically important that you take warfarin exactly as prescribed.  Many foods, especially foods high in vitamin K can interfere with warfarin and affect the PT and INR. Foods high in vitamin K include spinach, kale, broccoli, cabbage, collard and turnip greens, brussels sprouts, peas, cauliflower, seaweed, and parsley as well as beef and pork liver, green tea, and soybean oil. You should eat a consistent amount of foods high in vitamin K. Avoid major changes in your diet, or notify your caregiver before changing your diet. Arrange a visit with a dietitian to answer your questions.  Many medicines can interfere with warfarin and affect the PT and INR. You must tell your caregiver about any and all medicines you take, this includes all vitamins and supplements. Be especially cautious with aspirin and anti-inflammatory medicines. Do not take or discontinue any prescribed or over-the-counter medicine except on the advice of  your caregiver or pharmacist.  Warfarin can have side effects, such as excessive bruising or bleeding. You will need to hold pressure over cuts for longer than usual. Your caregiver or pharmacist will discuss other potential side effects.  Avoid sports or activities that may cause injury or bleeding.  Be mindful when shaving, flossing your teeth, or handling sharp objects.  Alcohol can change the body's ability to handle warfarin. It is best to avoid alcoholic drinks or consume only very small amounts while taking warfarin. Notify your caregiver if you change your alcohol intake.  Notify your dentist or other caregivers before procedures.  Eat a diet that includes 5 or more servings of fruits and vegetables each day. This may reduce the risk of stroke. Certain diets may be prescribed to address high blood pressure, high cholesterol, diabetes, or obesity.  A low-sodium, low-saturated fat, low-trans fat, low-cholesterol diet is recommended to manage high blood pressure.  A low-saturated fat, low-trans fat, low-cholesterol, and high-fiber diet may control cholesterol levels.  A controlled-carbohydrate, controlled-sugar diet is recommended to manage diabetes.  A reduced-calorie, low-sodium, low-saturated fat, low-trans fat, low-cholesterol diet is recommended to manage obesity.  Maintain a healthy weight.  Stay physically active. It is recommended that you get at least 30 minutes of activity on most or all days.  Do not smoke.  Limit alcohol use even if you are  not taking warfarin. Moderate alcohol use is considered to be:  No more than 2 drinks each day for men.  No more than 1 drink each day for nonpregnant women.  Stop drug abuse.  Home safety. A safe home environment is important to reduce the risk of falls. Your caregiver may arrange for specialists to evaluate your home. Having grab bars in the bedroom and bathroom is often important. Your caregiver may arrange for equipment to  be used at home, such as raised toilets and a seat for the shower.  Follow all instructions for follow-up with your caregiver. This is very important. This includes any referrals and lab tests. Proper follow up can prevent a stroke or another TIA from occurring. SEEK MEDICAL CARE IF:  You have personality changes.  You have difficulty swallowing.  You are seeing double.  You have dizziness.  You have a fever.  You have skin breakdown. SEEK IMMEDIATE MEDICAL CARE IF:  Any of these symptoms may represent a serious problem that is an emergency. Do not wait to see if the symptoms will go away. Get medical help right away. Call your local emergency services (911 in [place] Do not drive yourself to the hospital.  You have sudden weakness or numbness of the face, arm, or leg, especially on one side of the body.  You have sudden trouble walking or difficulty moving arms or legs.  You have sudden confusion.  You have trouble speaking (aphasia) or understanding.  You have sudden trouble seeing in one or both eyes.  You have a loss of balance or coordination.  You have a sudden, severe headache with no known cause.  You have new chest pain or an irregular heartbeat.  You have a partial or total loss of consciousness. MAKE SURE YOU:   Understand these instructions.  Will watch your condition.  Will get help right away if you are not doing well or get worse. Document Released: 10/17/2004 Document Revised: 01/12/2013 Document Reviewed: 04/14/2013 [facility] Patient Information 2015 [place], [place]. This information is not intended to replace advice given to you by your health care provider. Make sure you discuss any questions you have with your health care provider.  Hypotension As your heart beats, it forces blood through your arteries. This force is your blood pressure. If your blood pressure is too low for you to go about your normal activities or to support the organs of your body,  you have hypotension. Hypotension is also referred to as low blood pressure. When your blood pressure becomes too low, you may not get enough blood to your brain. As a result, you may feel weak, feel lightheaded, or develop a rapid heart rate. In a more severe case, you may faint. CAUSES Various conditions can cause hypotension. These include:  Blood loss.  Dehydration.  Heart or endocrine problems.  Pregnancy.  Severe infection.  Not having a well-balanced diet filled with needed nutrients.  Severe allergic reactions (anaphylaxis). Some medicines, such as blood pressure medicine or water pills (diuretics), may lower your blood pressure below normal. Sometimes taking too much medicine or taking medicine not as directed can cause hypotension. TREATMENT  Hospitalization is sometimes required for hypotension if fluid or blood replacement is needed, if time is needed for medicines to wear off, or if further monitoring is needed. Treatment might include changing your diet, changing your medicines (including medicines aimed at raising your blood pressure), and use of support stockings. HOME CARE INSTRUCTIONS   Drink enough fluids to  keep your urine clear or pale yellow.  Take your medicines as directed by your health care provider.  Get up slowly from reclining or sitting positions. This gives your blood pressure a chance to adjust.  Wear support stockings as directed by your health care provider.  Maintain a healthy diet by including nutritious food, such as fruits, vegetables, nuts, whole grains, and lean meats. SEEK MEDICAL CARE IF:  You have vomiting or diarrhea.  You have a fever for more than 2-3 days.  You feel more thirsty than usual.  You feel weak and tired. SEEK IMMEDIATE MEDICAL CARE IF:   You have chest pain or a fast or irregular heartbeat.  You have a loss of feeling in some part of your body, or you lose movement in your arms or legs.  You have trouble  speaking.  You become sweaty or feel lightheaded.  You faint. MAKE SURE YOU:   Understand these instructions.  Will watch your condition.  Will get help right away if you are not doing well or get worse. Document Released: 01/07/2005 Document Revised: 10/28/2012 Document Reviewed: 07/10/2012 [facility] Patient Information 2015 [place], [place]. This information is not intended to replace advice given to you by your health care provider. Make sure you discuss any questions you have with your health care provider.

## 2013-09-03 ENCOUNTER — Other Ambulatory Visit: Payer: Self-pay | Admitting: Registered Nurse

## 2013-09-03 DIAGNOSIS — H3400 Transient retinal artery occlusion, unspecified eye: Secondary | ICD-10-CM

## 2013-09-03 DIAGNOSIS — G459 Transient cerebral ischemic attack, unspecified: Secondary | ICD-10-CM | POA: Diagnosis not present

## 2013-09-03 DIAGNOSIS — E785 Hyperlipidemia, unspecified: Secondary | ICD-10-CM | POA: Diagnosis not present

## 2013-09-03 DIAGNOSIS — I2510 Atherosclerotic heart disease of native coronary artery without angina pectoris: Secondary | ICD-10-CM | POA: Diagnosis not present

## 2013-09-03 DIAGNOSIS — Z6825 Body mass index (BMI) 25.0-25.9, adult: Secondary | ICD-10-CM | POA: Diagnosis not present

## 2013-09-03 LAB — CBG MONITORING, ED: Glucose-Capillary: 114 mg/dL — ABNORMAL HIGH (ref 70–99)

## 2013-09-03 NOTE — ED Provider Notes (Signed)
Medical screening examination/treatment/procedure(s) were conducted as a shared visit with non-physician practitioner(s) and myself.  I personally evaluated the patient during the encounter.   EKG Interpretation   Date/Time:  Thursday September 02 2013 15:10:48 EDT Ventricular Rate:  64 PR Interval:  172 QRS Duration: 84 QT Interval:  430 QTC Calculation: 443 R Axis:   44 Text Interpretation:  Normal sinus rhythm Cannot rule out Anterior infarct  , age undetermined Abnormal ECG nonspecific ST abnormality no longer  evident Confirmed by [facility]  [facility], TREY ([id number]) on 09/03/2013 1:01:15 PM      80 yo male with an episode yesterday of visual disturbances, inability to find and/or articulate words, and difficulty remembering how to spell his name.  Symptoms have since resolved.  On exam, well appearing, nontoxic, not distressed, normal respiratory effort, normal perfusion, CN II-VII intact, strength and sensation normal in all extremities, normal gait, 2+ patellar reflexes, coordination intact.  During our conversation, pt notes that the only symptom he maintains is that he occasionally mixes up his words.  Initial ED workup negative.  Dr. Waynard EdwardsPerini [facility] evaluated pt in ED and preferred to pursue and outpatient TIA workup.  Pt comfortable with this plan.    Clinical Impression: 1. Other specified transient cerebral ischemias     Candyce ChurnJohn David Keesha Pellum III, MD 09/03/13 1301

## 2013-09-03 NOTE — ED Provider Notes (Signed)
Medical screening examination/treatment/procedure(s) were performed by non-physician practitioner and as supervising physician I was immediately available for consultation/collaboration.   EKG Interpretation   Date/Time:  Thursday September 02 2013 15:10:48 EDT Ventricular Rate:  64 PR Interval:  172 QRS Duration: 84 QT Interval:  430 QTC Calculation: 443 R Axis:   44 Text Interpretation:  Normal sinus rhythm Cannot rule out Anterior infarct  , age undetermined Abnormal ECG nonspecific ST abnormality no longer  evident Confirmed by [facility], TREY ([id number]) on 09/03/2013 1:01:15 PM        Jonny RuizJohn Young Berryavid Gurpreet Mariani III, MD 09/03/13 1302

## 2013-09-06 ENCOUNTER — Other Ambulatory Visit: Payer: Federal, State, Local not specified - PPO

## 2013-09-06 ENCOUNTER — Other Ambulatory Visit (HOSPITAL_COMMUNITY): Payer: Self-pay | Admitting: Internal Medicine

## 2013-09-06 ENCOUNTER — Ambulatory Visit
Admission: RE | Admit: 2013-09-06 | Discharge: 2013-09-06 | Disposition: A | Discharge status: Home / Self Care | Payer: Medicare Other | Source: Ambulatory Visit | Attending: Registered Nurse | Admitting: Registered Nurse

## 2013-09-06 ENCOUNTER — Other Ambulatory Visit: Payer: Self-pay | Admitting: Registered Nurse

## 2013-09-06 DIAGNOSIS — R4789 Other speech disturbances: Secondary | ICD-10-CM | POA: Diagnosis not present

## 2013-09-06 DIAGNOSIS — Z139 Encounter for screening, unspecified: Secondary | ICD-10-CM

## 2013-09-06 DIAGNOSIS — G459 Transient cerebral ischemic attack, unspecified: Secondary | ICD-10-CM

## 2013-09-06 DIAGNOSIS — H3400 Transient retinal artery occlusion, unspecified eye: Secondary | ICD-10-CM

## 2013-09-06 DIAGNOSIS — Z135 Encounter for screening for eye and ear disorders: Secondary | ICD-10-CM | POA: Diagnosis not present

## 2013-09-06 DIAGNOSIS — R42 Dizziness and giddiness: Secondary | ICD-10-CM | POA: Diagnosis not present

## 2013-09-06 MED ORDER — GADOBENATE DIMEGLUMINE 529 MG/ML IV SOLN
14.0000 mL | Freq: Once | INTRAVENOUS | Status: AC | PRN
  Administered 2013-09-06: 14 mL via INTRAVENOUS

## 2013-09-07 ENCOUNTER — Ambulatory Visit (HOSPITAL_COMMUNITY)
Admission: RE | Admit: 2013-09-07 | Discharge: 2013-09-07 | Disposition: A | Discharge status: Home / Self Care | Payer: Medicare Other | Source: Ambulatory Visit | Attending: Cardiology | Admitting: Cardiology

## 2013-09-07 DIAGNOSIS — G459 Transient cerebral ischemic attack, unspecified: Secondary | ICD-10-CM

## 2013-09-07 DIAGNOSIS — I2510 Atherosclerotic heart disease of native coronary artery without angina pectoris: Secondary | ICD-10-CM | POA: Diagnosis not present

## 2013-09-07 NOTE — Progress Notes (Signed)
2D Echo Performed 09/07/2013    Yuchen Fedor, RCS  

## 2013-09-23 DIAGNOSIS — N400 Enlarged prostate without lower urinary tract symptoms: Secondary | ICD-10-CM | POA: Diagnosis not present

## 2013-09-28 DIAGNOSIS — IMO0002 Reserved for concepts with insufficient information to code with codable children: Secondary | ICD-10-CM | POA: Diagnosis not present

## 2013-09-28 DIAGNOSIS — I959 Hypotension, unspecified: Secondary | ICD-10-CM | POA: Diagnosis not present

## 2013-09-28 DIAGNOSIS — M25559 Pain in unspecified hip: Secondary | ICD-10-CM | POA: Diagnosis not present

## 2013-09-28 DIAGNOSIS — R109 Unspecified abdominal pain: Secondary | ICD-10-CM | POA: Diagnosis not present

## 2013-10-18 DIAGNOSIS — M25559 Pain in unspecified hip: Secondary | ICD-10-CM | POA: Diagnosis not present

## 2013-10-27 DIAGNOSIS — R3915 Urgency of urination: Secondary | ICD-10-CM | POA: Diagnosis not present

## 2013-10-27 DIAGNOSIS — N401 Enlarged prostate with lower urinary tract symptoms: Secondary | ICD-10-CM | POA: Diagnosis not present

## 2013-10-27 DIAGNOSIS — R350 Frequency of micturition: Secondary | ICD-10-CM | POA: Diagnosis not present

## 2013-10-28 ENCOUNTER — Encounter: Payer: Self-pay | Admitting: Cardiology

## 2013-10-28 ENCOUNTER — Ambulatory Visit (INDEPENDENT_AMBULATORY_CARE_PROVIDER_SITE_OTHER): Payer: Medicare Other | Admitting: Cardiology

## 2013-10-28 VITALS — BP 102/58 | HR 67 | Ht 68.0 in | Wt 162.7 lb

## 2013-10-28 DIAGNOSIS — I2510 Atherosclerotic heart disease of native coronary artery without angina pectoris: Secondary | ICD-10-CM

## 2013-10-28 DIAGNOSIS — I10 Essential (primary) hypertension: Secondary | ICD-10-CM | POA: Diagnosis not present

## 2013-10-28 NOTE — Progress Notes (Signed)
HPI: fu coronary artery disease. 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 He underwent placement of Cypher drug-eluting stent to the distal right coronary artery and the mid LAD. At that time, ejection fraction was normal. Cath 12/09 with nonobstructive CAD and patent stents. Nuclear study 7/13 showed EF 67 and normal perfusion. 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 Seen in ER 8/15 with possible TIA. Echo 8/15 showed normal LV function. MRA 8/15 showed small vessel change but no significant stenosis. Since last seen, He denies dyspnea, chest pain, palpitations or syncope.   Current Outpatient Prescriptions  Medication Sig Dispense Refill  . carvedilol (COREG) 12.5 MG tablet Take 1 tablet (12.5 mg total) by mouth 2 (two) times daily with a meal.  60 tablet  9  . cholecalciferol (VITAMIN D) 1000 UNITS tablet Take 1,000 Units by mouth daily.      . clopidogrel (PLAVIX) 75 MG tablet       . escitalopram (LEXAPRO) 10 MG tablet Take 10 mg by mouth daily.      Marland Kitchen. FLUZONE HIGH-DOSE 0.5 ML SUSY       . isosorbide mononitrate (IMDUR) 60 MG 24 hr tablet Take 60 mg by mouth daily.      Marland Kitchen. levothyroxine (SYNTHROID, LEVOTHROID) 50 MCG tablet Take 50 mcg by mouth daily.        . Melatonin 300 MCG TABS Take by mouth daily. 5 MG      . Omega-3 Fatty Acids (FISH OIL) 1000 MG CAPS Take by mouth daily.        Marland Kitchen. omeprazole (PRILOSEC) 20 MG capsule Take 20 mg by mouth daily.       . predniSONE (DELTASONE) 5 MG tablet       . simvastatin (ZOCOR) 20 MG tablet Take 20 mg by mouth daily.      . Tamsulosin HCl (FLOMAX) 0.4 MG CAPS 0.4 mg daily.        No current facility-administered medications for this visit.     Past Medical History  Diagnosis Date  . Unspecified disorder of kidney and ureter   . Anemia, unspecified   . Hemorrhage of rectum and anus   . Unspecified essential hypertension   . Carpal tunnel syndrome   . Coronary  atherosclerosis of unspecified type of vessel, native or graft   . Hyperchylomicronemia   . Esophageal reflux     Past Surgical History  Procedure Laterality Date  . Angioplasy/stent  2006  . Lamenectomy  1965    History   Social History  . Marital Status: Married    Spouse Name: N/A    Number of Children: N/A  . Years of Education: N/A   Occupational History  . Not on file.   Social History Main Topics  . Smoking status: Former Smoker    Types: Pipe, Cigars    Quit date: 10/10/1999  . Smokeless tobacco: Never Used     Comment: cigars only in 2005  . Alcohol Use: Yes     Comment: Scotch   . Drug Use: No  . Sexual Activity: Not on file   Other Topics Concern  . Not on file   Social History Narrative   ** Merged History Encounter **       Retired; divorced. Daily Caffeine use- 6-7 cups. Pt does not get regular exercise.     ROS: Pain in left inguinal area but no fevers or chills, productive cough,  hemoptysis, dysphasia, odynophagia, melena, hematochezia, dysuria, hematuria, rash, seizure activity, orthopnea, PND, pedal edema, claudication. Remaining systems are negative.  Physical Exam: Well-developed well-nourished in no acute distress.  Skin is warm and dry.  HEENT is normal.  Neck is supple.  Chest is clear to auscultation with normal expansion.  Cardiovascular exam is regular rate and rhythm.  Abdominal exam nontender or distended. No masses palpated. Extremities show no edema. neuro grossly intact  ECG 09/02/2013-sinus rhythm, cannot exclude prior anterior infarct.

## 2013-10-28 NOTE — Assessment & Plan Note (Signed)
Continue statin. 

## 2013-10-28 NOTE — Assessment & Plan Note (Signed)
-   Continue Plavix and statin 

## 2013-10-28 NOTE — Assessment & Plan Note (Signed)
Patient states his blood pressure had been running low. I will discontinue his nitrates and we will follow. Carvedilol to be decreased in the future if needed.

## 2013-10-28 NOTE — Patient Instructions (Signed)
Your physician wants you to follow-up in: ONE YEAR WITH DR CRENSHAW You will receive a reminder letter in the mail two months in advance. If you don't receive a letter, please call our office to schedule the follow-up appointment.   STOP ISOSORBIDE 

## 2013-11-09 DIAGNOSIS — M25552 Pain in left hip: Secondary | ICD-10-CM | POA: Diagnosis not present

## 2013-11-15 DIAGNOSIS — M25552 Pain in left hip: Secondary | ICD-10-CM | POA: Diagnosis not present

## 2013-11-15 DIAGNOSIS — I252 Old myocardial infarction: Secondary | ICD-10-CM | POA: Diagnosis not present

## 2013-11-15 DIAGNOSIS — I10 Essential (primary) hypertension: Secondary | ICD-10-CM | POA: Diagnosis not present

## 2013-11-15 DIAGNOSIS — Z1389 Encounter for screening for other disorder: Secondary | ICD-10-CM | POA: Diagnosis not present

## 2013-11-15 DIAGNOSIS — E785 Hyperlipidemia, unspecified: Secondary | ICD-10-CM | POA: Diagnosis not present

## 2013-11-15 DIAGNOSIS — E039 Hypothyroidism, unspecified: Secondary | ICD-10-CM | POA: Diagnosis not present

## 2013-11-15 DIAGNOSIS — N401 Enlarged prostate with lower urinary tract symptoms: Secondary | ICD-10-CM | POA: Diagnosis not present

## 2013-11-15 DIAGNOSIS — G459 Transient cerebral ischemic attack, unspecified: Secondary | ICD-10-CM | POA: Diagnosis not present

## 2013-11-15 DIAGNOSIS — N183 Chronic kidney disease, stage 3 (moderate): Secondary | ICD-10-CM | POA: Diagnosis not present

## 2013-11-22 DIAGNOSIS — M4806 Spinal stenosis, lumbar region: Secondary | ICD-10-CM | POA: Diagnosis not present

## 2013-11-24 DIAGNOSIS — M4806 Spinal stenosis, lumbar region: Secondary | ICD-10-CM | POA: Diagnosis not present

## 2013-12-18 ENCOUNTER — Other Ambulatory Visit: Payer: Self-pay | Admitting: Cardiology

## 2013-12-20 ENCOUNTER — Other Ambulatory Visit: Payer: Self-pay | Admitting: Cardiology

## 2014-04-11 DIAGNOSIS — H25013 Cortical age-related cataract, bilateral: Secondary | ICD-10-CM | POA: Diagnosis not present

## 2014-04-11 DIAGNOSIS — H2513 Age-related nuclear cataract, bilateral: Secondary | ICD-10-CM | POA: Diagnosis not present

## 2014-04-11 DIAGNOSIS — H43813 Vitreous degeneration, bilateral: Secondary | ICD-10-CM | POA: Diagnosis not present

## 2014-05-16 DIAGNOSIS — I2510 Atherosclerotic heart disease of native coronary artery without angina pectoris: Secondary | ICD-10-CM | POA: Diagnosis not present

## 2014-05-16 DIAGNOSIS — R739 Hyperglycemia, unspecified: Secondary | ICD-10-CM | POA: Diagnosis not present

## 2014-05-16 DIAGNOSIS — E039 Hypothyroidism, unspecified: Secondary | ICD-10-CM | POA: Diagnosis not present

## 2014-05-16 DIAGNOSIS — Z6826 Body mass index (BMI) 26.0-26.9, adult: Secondary | ICD-10-CM | POA: Diagnosis not present

## 2014-05-16 DIAGNOSIS — Z125 Encounter for screening for malignant neoplasm of prostate: Secondary | ICD-10-CM | POA: Diagnosis not present

## 2014-05-16 DIAGNOSIS — Z0000 Encounter for general adult medical examination without abnormal findings: Secondary | ICD-10-CM | POA: Diagnosis not present

## 2014-05-16 DIAGNOSIS — E785 Hyperlipidemia, unspecified: Secondary | ICD-10-CM | POA: Diagnosis not present

## 2014-05-16 DIAGNOSIS — M542 Cervicalgia: Secondary | ICD-10-CM | POA: Diagnosis not present

## 2014-05-16 DIAGNOSIS — I10 Essential (primary) hypertension: Secondary | ICD-10-CM | POA: Diagnosis not present

## 2014-05-23 DIAGNOSIS — M542 Cervicalgia: Secondary | ICD-10-CM | POA: Diagnosis not present

## 2014-05-23 DIAGNOSIS — M25552 Pain in left hip: Secondary | ICD-10-CM | POA: Diagnosis not present

## 2014-05-23 DIAGNOSIS — F339 Major depressive disorder, recurrent, unspecified: Secondary | ICD-10-CM | POA: Diagnosis not present

## 2014-05-23 DIAGNOSIS — G968 Other specified disorders of central nervous system: Secondary | ICD-10-CM | POA: Diagnosis not present

## 2014-05-23 DIAGNOSIS — Z6826 Body mass index (BMI) 26.0-26.9, adult: Secondary | ICD-10-CM | POA: Diagnosis not present

## 2014-05-23 DIAGNOSIS — R490 Dysphonia: Secondary | ICD-10-CM | POA: Diagnosis not present

## 2014-05-23 DIAGNOSIS — R809 Proteinuria, unspecified: Secondary | ICD-10-CM | POA: Diagnosis not present

## 2014-05-23 DIAGNOSIS — R269 Unspecified abnormalities of gait and mobility: Secondary | ICD-10-CM | POA: Diagnosis not present

## 2014-05-23 DIAGNOSIS — Z23 Encounter for immunization: Secondary | ICD-10-CM | POA: Diagnosis not present

## 2014-05-23 DIAGNOSIS — Z0000 Encounter for general adult medical examination without abnormal findings: Secondary | ICD-10-CM | POA: Diagnosis not present

## 2014-05-23 DIAGNOSIS — I129 Hypertensive chronic kidney disease with stage 1 through stage 4 chronic kidney disease, or unspecified chronic kidney disease: Secondary | ICD-10-CM | POA: Diagnosis not present

## 2014-05-23 DIAGNOSIS — K5900 Constipation, unspecified: Secondary | ICD-10-CM | POA: Diagnosis not present

## 2014-05-24 DIAGNOSIS — Z1212 Encounter for screening for malignant neoplasm of rectum: Secondary | ICD-10-CM | POA: Diagnosis not present

## 2014-10-04 DIAGNOSIS — N401 Enlarged prostate with lower urinary tract symptoms: Secondary | ICD-10-CM | POA: Diagnosis not present

## 2014-10-04 DIAGNOSIS — N138 Other obstructive and reflux uropathy: Secondary | ICD-10-CM | POA: Diagnosis not present

## 2014-10-04 DIAGNOSIS — R351 Nocturia: Secondary | ICD-10-CM | POA: Diagnosis not present

## 2014-11-14 DIAGNOSIS — M5417 Radiculopathy, lumbosacral region: Secondary | ICD-10-CM | POA: Diagnosis not present

## 2014-11-14 DIAGNOSIS — R269 Unspecified abnormalities of gait and mobility: Secondary | ICD-10-CM | POA: Diagnosis not present

## 2014-11-14 DIAGNOSIS — M545 Low back pain: Secondary | ICD-10-CM | POA: Diagnosis not present

## 2014-11-14 DIAGNOSIS — M4806 Spinal stenosis, lumbar region: Secondary | ICD-10-CM | POA: Diagnosis not present

## 2014-11-14 DIAGNOSIS — G894 Chronic pain syndrome: Secondary | ICD-10-CM | POA: Diagnosis not present

## 2014-11-24 ENCOUNTER — Encounter: Payer: Self-pay | Admitting: Cardiology

## 2014-11-24 DIAGNOSIS — E039 Hypothyroidism, unspecified: Secondary | ICD-10-CM | POA: Diagnosis not present

## 2014-11-24 DIAGNOSIS — I10 Essential (primary) hypertension: Secondary | ICD-10-CM | POA: Diagnosis not present

## 2014-11-24 DIAGNOSIS — I2510 Atherosclerotic heart disease of native coronary artery without angina pectoris: Secondary | ICD-10-CM | POA: Diagnosis not present

## 2014-11-24 DIAGNOSIS — Z6825 Body mass index (BMI) 25.0-25.9, adult: Secondary | ICD-10-CM | POA: Diagnosis not present

## 2014-11-24 DIAGNOSIS — G968 Other specified disorders of central nervous system: Secondary | ICD-10-CM | POA: Diagnosis not present

## 2014-11-24 DIAGNOSIS — I129 Hypertensive chronic kidney disease with stage 1 through stage 4 chronic kidney disease, or unspecified chronic kidney disease: Secondary | ICD-10-CM | POA: Diagnosis not present

## 2014-11-24 DIAGNOSIS — N183 Chronic kidney disease, stage 3 (moderate): Secondary | ICD-10-CM | POA: Diagnosis not present

## 2014-11-24 DIAGNOSIS — E785 Hyperlipidemia, unspecified: Secondary | ICD-10-CM | POA: Diagnosis not present

## 2014-11-24 DIAGNOSIS — R413 Other amnesia: Secondary | ICD-10-CM | POA: Diagnosis not present

## 2014-11-28 DIAGNOSIS — R269 Unspecified abnormalities of gait and mobility: Secondary | ICD-10-CM | POA: Diagnosis not present

## 2014-11-28 DIAGNOSIS — M545 Low back pain: Secondary | ICD-10-CM | POA: Diagnosis not present

## 2014-11-28 DIAGNOSIS — M5417 Radiculopathy, lumbosacral region: Secondary | ICD-10-CM | POA: Diagnosis not present

## 2014-11-28 DIAGNOSIS — M4806 Spinal stenosis, lumbar region: Secondary | ICD-10-CM | POA: Diagnosis not present

## 2014-11-28 DIAGNOSIS — G894 Chronic pain syndrome: Secondary | ICD-10-CM | POA: Diagnosis not present

## 2014-12-23 NOTE — Progress Notes (Signed)
HPI: fu coronary artery disease. 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 He underwent placement of Cypher drug-eluting stent to the distal right coronary artery and the mid LAD. At that time, ejection fraction was normal. Cath 12/09 with nonobstructive CAD and patent stents. Nuclear study 7/13 showed EF 67 and normal perfusion. 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 Seen in ER 8/15 with possible TIA. Echo 8/15 showed normal LV function. MRA 8/15 showed small vessel change but no significant stenosis. Since last seen, He denies dyspnea, chest pain, palpitations or syncope.  Current Outpatient Prescriptions  Medication Sig Dispense Refill  . carvedilol (COREG) 12.5 MG tablet Take 1 tablet (12.5 mg total) by mouth 2 (two) times daily with a meal. 60 tablet 9  . cholecalciferol (VITAMIN D) 1000 UNITS tablet Take 1,000 Units by mouth daily.    . clopidogrel (PLAVIX) 75 MG tablet Take 75 mg by mouth daily.     Marland Kitchen escitalopram (LEXAPRO) 10 MG tablet Take 10 mg by mouth daily.    Marland Kitchen levothyroxine (SYNTHROID, LEVOTHROID) 50 MCG tablet Take 50 mcg by mouth daily.      . Melatonin 5 MG CAPS Take 1 capsule by mouth at bedtime as needed.    Marland Kitchen omeprazole (PRILOSEC) 20 MG capsule Take 20 mg by mouth daily.     . Tamsulosin HCl (FLOMAX) 0.4 MG CAPS 0.4 mg daily.     . simvastatin (ZOCOR) 20 MG tablet TAKE 1 TABLET BY MOUTH DAILY 90 tablet 3   No current facility-administered medications for this visit.     Past Medical History  Diagnosis Date  . Unspecified disorder of kidney and ureter   . Anemia, unspecified   . Hemorrhage of rectum and anus   . Unspecified essential hypertension   . Carpal tunnel syndrome   . Coronary atherosclerosis of unspecified type of vessel, native or graft   . Hyperchylomicronemia   . Esophageal reflux     Past Surgical History  Procedure Laterality Date  . Angioplasy/stent  2006  . Lamenectomy   1965    Social History   Social History  . Marital Status: Married    Spouse Name: N/A  . Number of Children: N/A  . Years of Education: N/A   Occupational History  . Not on file.   Social History Main Topics  . Smoking status: Former Smoker    Types: Pipe, Cigars    Quit date: 10/10/1999  . Smokeless tobacco: Never Used     Comment: cigars only in 2005  . Alcohol Use: Yes     Comment: Scotch   . Drug Use: No  . Sexual Activity: Not on file   Other Topics Concern  . Not on file   Social History Narrative   ** Merged History Encounter **       Retired; divorced. Daily Caffeine use- 6-7 cups. Pt does not get regular exercise.     ROS: Problems with low back, leg pain and neck pain but no fevers or chills, productive cough, hemoptysis, dysphasia, odynophagia, melena, hematochezia, dysuria, hematuria, rash, seizure activity, orthopnea, PND, pedal edema, claudication. Remaining systems are negative.  Physical Exam: Well-developed well-nourished in no acute distress.  Skin is warm and dry.  HEENT is normal.  Neck is supple.  Chest is clear to auscultation with normal expansion.  Cardiovascular exam is regular rate and rhythm.  Abdominal exam nontender or distended. No masses palpated. Extremities  show no edema. neuro grossly intact  ECG Sinus rhythm at a rate of 68. Normal axis. No ST changes.

## 2014-12-27 ENCOUNTER — Ambulatory Visit (INDEPENDENT_AMBULATORY_CARE_PROVIDER_SITE_OTHER): Payer: Medicare Other | Admitting: Cardiology

## 2014-12-27 ENCOUNTER — Encounter: Payer: Self-pay | Admitting: Cardiology

## 2014-12-27 VITALS — BP 120/60 | HR 68 | Ht 68.0 in | Wt 165.0 lb

## 2014-12-27 DIAGNOSIS — I2510 Atherosclerotic heart disease of native coronary artery without angina pectoris: Secondary | ICD-10-CM | POA: Diagnosis not present

## 2014-12-27 DIAGNOSIS — I10 Essential (primary) hypertension: Secondary | ICD-10-CM | POA: Diagnosis not present

## 2014-12-27 NOTE — Patient Instructions (Signed)
Your physician wants you to follow-up in: ONE YEAR WITH DR CRENSHAW You will receive a reminder letter in the mail two months in advance. If you don't receive a letter, please call our office to schedule the follow-up appointment.   If you need a refill on your cardiac medications before your next appointment, please call your pharmacy.  

## 2014-12-27 NOTE — Assessment & Plan Note (Signed)
Blood pressure controlled. Continue present medications. 

## 2014-12-27 NOTE — Assessment & Plan Note (Signed)
Continue aspirin and statin. 

## 2014-12-27 NOTE — Assessment & Plan Note (Addendum)
Continue Zocor. I discussed changing to high-dose statin but the patient declined.

## 2015-01-23 ENCOUNTER — Emergency Department (HOSPITAL_COMMUNITY): Payer: Medicare Other

## 2015-01-23 ENCOUNTER — Encounter (HOSPITAL_COMMUNITY): Payer: Self-pay | Admitting: Family Medicine

## 2015-01-23 ENCOUNTER — Emergency Department (HOSPITAL_COMMUNITY)
Admission: EM | Admit: 2015-01-23 | Discharge: 2015-01-23 | Disposition: A | Discharge status: Home / Self Care | Payer: Medicare Other | Attending: Emergency Medicine | Admitting: Emergency Medicine

## 2015-01-23 DIAGNOSIS — Z8669 Personal history of other diseases of the nervous system and sense organs: Secondary | ICD-10-CM | POA: Diagnosis not present

## 2015-01-23 DIAGNOSIS — Z7902 Long term (current) use of antithrombotics/antiplatelets: Secondary | ICD-10-CM | POA: Diagnosis not present

## 2015-01-23 DIAGNOSIS — Z9861 Coronary angioplasty status: Secondary | ICD-10-CM | POA: Insufficient documentation

## 2015-01-23 DIAGNOSIS — J189 Pneumonia, unspecified organism: Secondary | ICD-10-CM

## 2015-01-23 DIAGNOSIS — Z79899 Other long term (current) drug therapy: Secondary | ICD-10-CM | POA: Insufficient documentation

## 2015-01-23 DIAGNOSIS — J159 Unspecified bacterial pneumonia: Secondary | ICD-10-CM | POA: Insufficient documentation

## 2015-01-23 DIAGNOSIS — E783 Hyperchylomicronemia: Secondary | ICD-10-CM | POA: Diagnosis not present

## 2015-01-23 DIAGNOSIS — Z87448 Personal history of other diseases of urinary system: Secondary | ICD-10-CM | POA: Diagnosis not present

## 2015-01-23 DIAGNOSIS — I10 Essential (primary) hypertension: Secondary | ICD-10-CM | POA: Diagnosis not present

## 2015-01-23 DIAGNOSIS — K219 Gastro-esophageal reflux disease without esophagitis: Secondary | ICD-10-CM | POA: Insufficient documentation

## 2015-01-23 DIAGNOSIS — Z87891 Personal history of nicotine dependence: Secondary | ICD-10-CM | POA: Insufficient documentation

## 2015-01-23 DIAGNOSIS — Z862 Personal history of diseases of the blood and blood-forming organs and certain disorders involving the immune mechanism: Secondary | ICD-10-CM | POA: Diagnosis not present

## 2015-01-23 DIAGNOSIS — R040 Epistaxis: Secondary | ICD-10-CM | POA: Insufficient documentation

## 2015-01-23 DIAGNOSIS — I2510 Atherosclerotic heart disease of native coronary artery without angina pectoris: Secondary | ICD-10-CM | POA: Insufficient documentation

## 2015-01-23 DIAGNOSIS — R05 Cough: Secondary | ICD-10-CM | POA: Diagnosis not present

## 2015-01-23 MED ORDER — OXYMETAZOLINE HCL 0.05 % NA SOLN
1.0000 | Freq: Once | NASAL | Status: AC
  Administered 2015-01-23: 1 via NASAL
  Filled 2015-01-23: qty 15

## 2015-01-23 MED ORDER — OXYMETAZOLINE HCL 0.05 % NA SOLN: 1.0000 | Freq: Two times a day (BID) | NASAL | Status: AC

## 2015-01-23 MED ORDER — AZITHROMYCIN 250 MG PO TABS: ORAL_TABLET | ORAL | Status: DC

## 2015-01-23 NOTE — ED Provider Notes (Signed)
CSN: [id number]     Arrival date & time 01/23/15  1923 History   First MD Initiated Contact with Patient 01/23/15 2145     Chief Complaint  Patient presents with  . Epistaxis  . URI     (Consider location/radiation/quality/duration/timing/severity/associated sxs/prior Treatment) HPI  Shawn Sanchez is a(n) 82 y.o. male who presents  With cc of URI and epistaxis. He has had chest congestion since Christmas of 2016. He has had a productive cough. He had reports intermittent blood streaked sputum. Over the last 3 days he has had intermittent nose bleeds, and coughing up blood clots. He feels this is secondary to blood from the nasal cavity. The patient is on Plavix. He has no history of nosebleeds. He has a medical caregiver that he attends him today. He called her because he was having severe uncontrolled nosebleed at home.  Past Medical History  Diagnosis Date  . Unspecified disorder of kidney and ureter   . Anemia, unspecified   . Hemorrhage of rectum and anus   . Unspecified essential hypertension   . Carpal tunnel syndrome   . Coronary atherosclerosis of unspecified type of vessel, native or graft   . Hyperchylomicronemia   . Esophageal reflux    Past Surgical History  Procedure Laterality Date  . Angioplasy/stent  2006  . Lamenectomy  1965   Family History  Problem Relation Age of Onset  . Breast cancer Mother   . Lung cancer Mother   . Diabetes Sister   . Lung cancer Father   . Coronary artery disease Father   . Hypertension Father   . Colon cancer Neg Hx    Social History  Substance Use Topics  . Smoking status: Former Smoker    Types: Pipe, Cigars    Quit date: 10/10/1999  . Smokeless tobacco: Never Used     Comment: cigars only in 2005  . Alcohol Use: Yes     Comment: Scotch. Stopped drinking 2-3 days ago.     Review of Systems  Ten systems reviewed and are negative for acute change, except as noted in the HPI.    Allergies  Novocain  Home  Medications   Prior to Admission medications   Medication Sig Start Date End Date Taking? Authorizing Provider  Acetaminophen-DM (COLD & COUGH DAYTIME PO) Take 1 tablet by mouth every 6 (six) hours as needed (cold symptoms).   Yes Historical Provider, MD  carvedilol (COREG) 12.5 MG tablet Take 1 tablet (12.5 mg total) by mouth 2 (two) times daily with a meal. 10/28/11  Yes Lewayne Bunting, MD  cholecalciferol (VITAMIN D) 1000 UNITS tablet Take 1,000 Units by mouth daily.   Yes Historical Provider, MD  clopidogrel (PLAVIX) 75 MG tablet Take 75 mg by mouth at bedtime.  09/03/13  Yes Historical Provider, MD  escitalopram (LEXAPRO) 10 MG tablet Take 10 mg by mouth daily.   Yes Historical Provider, MD  levothyroxine (SYNTHROID, LEVOTHROID) 50 MCG tablet Take 50 mcg by mouth daily.     Yes Historical Provider, MD  Melatonin 5 MG CAPS Take 1 capsule by mouth at bedtime.    Yes Historical Provider, MD  omeprazole (PRILOSEC) 20 MG capsule Take 20 mg by mouth daily.  09/25/12  Yes Historical Provider, MD  simvastatin (ZOCOR) 20 MG tablet TAKE 1 TABLET BY MOUTH DAILY 12/22/13  Yes Lewayne Bunting, MD  Tamsulosin HCl (FLOMAX) 0.4 MG CAPS Take 0.4 mg by mouth daily.  07/20/11  Yes Historical Provider, MD  BP 137/80 mmHg  Pulse 67  Temp(Src) 98.2 F (36.8 C) (Oral)  Resp 20  Ht 5\' 8"  (1.727 m)  Wt 76.204 kg  BMI 25.55 kg/m2  SpO2 96% Physical Exam  Constitutional: He appears well-developed and well-nourished. No distress.  HENT:  Head: Normocephalic and atraumatic.  Nose: No mucosal edema, rhinorrhea or nasal deformity. Epistaxis is observed.    Eyes: Conjunctivae are normal. No scleral icterus.  Neck: Normal range of motion. Neck supple.  Cardiovascular: Normal rate, regular rhythm and normal heart sounds.   Pulmonary/Chest: Effort normal and breath sounds normal. No respiratory distress.  Abdominal: Soft. He exhibits no distension. There is no tenderness.  Musculoskeletal: He exhibits no edema.    Neurological: He is alert.  Skin: Skin is warm and dry. He is not diaphoretic.  Psychiatric: His behavior is normal.  Nursing note and vitals reviewed.   ED Course  .Epistaxis Management Date/Time: 01/24/2015 12:47 AM Performed by: Arthor CaptainHARRIS, Addilynne Olheiser Authorized by: Arthor CaptainHARRIS, Shenequa Howse Consent: Verbal consent obtained. Risks and benefits: risks, benefits and alternatives were discussed Consent given by: patient Patient identity confirmed: verbally with patient Time out: Immediately prior to procedure a "time out" was called to verify the correct patient, procedure, equipment, support staff and site/side marked as required. Treatment site: left anterior Repair method: gauze soaked with oxymetolazone. Post-procedure assessment: bleeding stopped Treatment complexity: simple Patient tolerance: Patient tolerated the procedure well with no immediate complications   (including critical care time) Labs Review Labs Reviewed - No data to display  Imaging Review Dg Chest 2 View  01/23/2015  CLINICAL DATA:  Cough for 2 weeks. Epistaxis for 2 days, question hemoptysis. EXAM: CHEST  2 VIEW COMPARISON:  09/13/2008 FINDINGS: The cardiomediastinal contours are normal. Coronary stent is in place. Diffuse interstitial thickening. No consolidation, pleural effusion, or pneumothorax. No acute osseous abnormalities are seen. IMPRESSION: Diffuse interstitial thickening which may be inflammatory or reflect atypical infection. Electronically Signed   By: Rubye OaksMelanie  Ehinger M.D.   On: 01/23/2015 22:31   I have personally reviewed and evaluated these images and lab results as part of my medical decision-making.   EKG Interpretation None      MDM   Final diagnoses:  CAP (community acquired pneumonia)  Epistaxis    BP 143/78 mmHg  Pulse 68  Temp(Src) 98.2 F (36.8 C) (Oral)  Resp 18  Ht 5\' 8"  (1.727 m)  Wt 76.204 kg  BMI 25.55 kg/m2  SpO2 98% Patient with possible atypical pneumonia. Afebrile and  HDS. Epistaxis treated without recurrence. Will d/c with zpack, oxymetolazone, and home care instructions for epistaxis. F/u with ENT and PCP. Patient seen in shared visit with attending physician. Discussed return precautions.    Arthor CaptainAbigail Cataleya Cristina, PA-C 01/24/15 [id number]  Mancel BaleElliott Wentz, MD 01/25/15 [phone]

## 2015-01-23 NOTE — ED Provider Notes (Signed)
Medications  oxymetazoline (AFRIN) 0.05 % nasal spray 1 spray (1 spray Each Nare Given 01/23/15 2057)    Patient Vitals for the past 24 hrs:  BP Temp Temp src Pulse Resp SpO2 Height Weight  01/23/15 1935 137/80 mmHg 98.2 F (36.8 C) Oral 67 20 96 % 5\' 8"  (1.727 m) 168 lb (76.204 kg)      Face-to-face evaluation   History: He complains of epistaxis which started today. He has also had some cough with mucus and blood in it. He is on anticoagulants.  Physical exam: Alert, calm, cooperative. Lungs clear. Nose, at the time of examination was not bleeding.  Medical screening examination/treatment/procedure(s) were conducted as a shared visit with non-physician practitioner(s) and myself.  I personally evaluated the patient during the encounter   Mancel BaleElliott Tanette Chauca, MD 01/25/15 1313

## 2015-01-23 NOTE — ED Notes (Addendum)
Patient reports he has chest congestion that started 12/26. Symptoms: Dry cough with occasional productive cough. At times coughing up blood tinged sputum.  Denies fever.  In the last 2 days, pt reports having a intermittent nosebleed. Currently, pt does not have a nosebleed.

## 2015-01-23 NOTE — Discharge Instructions (Signed)
Nosebleeds commonly recur.  If your nose starts bleeding again and doesn't stop after 20  minutes of constant pressure squeezing the soft part of your nose, then soak the gauze with afrin and apply pressure for another 20 minuntes. If it still will not stop, re-apply the direct pressure and return to the ED. Return also immediately to the nearest ED if you develop chest pain, shortness of breath, dizziness or fainting, severe bleeding, or other concerns.   Community-Acquired Pneumonia, Adult Pneumonia is an infection of the lungs. There are different types of pneumonia. One type can develop while a person is in a hospital. A different type, called community-acquired pneumonia, develops in people who are not, or have not recently been, in the hospital or other health care facility.  CAUSES Pneumonia may be caused by bacteria, viruses, or funguses. Community-acquired pneumonia is often caused by Streptococcus pneumonia bacteria. These bacteria are often passed from one person to another by breathing in droplets from the cough or sneeze of an infected person. RISK FACTORS The condition is more likely to develop in:  People who havechronic diseases, such as chronic obstructive pulmonary disease (COPD), asthma, congestive heart failure, cystic fibrosis, diabetes, or kidney disease.  People who haveearly-stage or late-stage HIV.  People who havesickle cell disease.  People who havehad their spleen removed (splenectomy).  People who havepoor Administrator.  People who havemedical conditions that increase the risk of breathing in (aspirating) secretions their own mouth and nose.   People who havea weakened immune system (immunocompromised).  People who smoke.  People whotravel to areas where pneumonia-causing germs commonly exist.  People whoare around animal habitats or animals that have pneumonia-causing germs, including birds, bats, rabbits, cats, and farm  animals. SYMPTOMS Symptoms of this condition include:  Adry cough.  A wet (productive) cough.  Fever.  Sweating.  Chest pain, especially when breathing deeply or coughing.  Rapid breathing or difficulty breathing.  Shortness of breath.  Shaking chills.  Fatigue.  Muscle aches. DIAGNOSIS Your health care provider will take a medical history and perform a physical exam. You may also have other tests, including:  Imaging studies of your chest, including X-rays.  Tests to check your blood oxygen level and other blood gases.  Other tests on blood, mucus (sputum), fluid around your lungs (pleural fluid), and urine. If your pneumonia is severe, other tests may be done to identify the specific cause of your illness. TREATMENT The type of treatment that you receive depends on many factors, such as the cause of your pneumonia, the medicines you take, and other medical conditions that you have. For most adults, treatment and recovery from pneumonia may occur at home. In some cases, treatment must happen in a hospital. Treatment may include:  Antibiotic medicines, if the pneumonia was caused by bacteria.  Antiviral medicines, if the pneumonia was caused by a virus.  Medicines that are given by mouth or through an IV tube.  Oxygen.  Respiratory therapy. Although rare, treating severe pneumonia may include:  Mechanical ventilation. This is done if you are not breathing well on your own and you cannot maintain a safe blood oxygen level.  Thoracentesis. This procedureremoves fluid around one lung or both lungs to help you breathe better. HOME CARE INSTRUCTIONS  Take over-the-counter and prescription medicines only as told by your health care provider.  Only takecough medicine if you are losing sleep. Understand that cough medicine can prevent your body's natural ability to remove mucus from your lungs.  If  you were prescribed an antibiotic medicine, take it as told by your  health care provider. Do not stop taking the antibiotic even if you start to feel better.  Sleep in a semi-upright position at night. Try sleeping in a reclining chair, or place a few pillows under your head.  Do not use tobacco products, including cigarettes, chewing tobacco, and e-cigarettes. If you need help quitting, ask your health care provider.  Drink enough water to keep your urine clear or pale yellow. This will help to thin out mucus secretions in your lungs. PREVENTION There are ways that you can decrease your risk of developing community-acquired pneumonia. Consider getting a pneumococcal vaccine if:  You are older than 80 years of age.  You are older than 80 years of age and are undergoing cancer treatment, have chronic lung disease, or have other medical conditions that affect your immune system. Ask your health care provider if this applies to you. There are different types and schedules of pneumococcal vaccines. Ask your health care provider which vaccination option is best for you. You may also prevent community-acquired pneumonia if you take these actions:  Get an influenza vaccine every year. Ask your health care provider which type of influenza vaccine is best for you.  Go to the dentist on a regular basis.  Wash your hands often. Use hand sanitizer if soap and water are not available. SEEK MEDICAL CARE IF:  You have a fever.  You are losing sleep because you cannot control your cough with cough medicine. SEEK IMMEDIATE MEDICAL CARE IF:  You have worsening shortness of breath.  You have increased chest pain.  Your sickness becomes worse, especially if you are an older adult or have a weakened immune system.  You cough up blood.   This information is not intended to replace advice given to you by your health care provider. Make sure you discuss any questions you have with your health care provider.   Document Released: 01/07/2005 Document Revised: 09/28/2014  Document Reviewed: 05/04/2014 Elsevier Interactive Patient Education 2016 [employer]  Nosebleed Nosebleeds are common. They are due to a crack in the inside lining of your nose (mucous membrane) or from a small blood vessel that starts to bleed. Nosebleeds can be caused by many conditions, such as injury, infections, dry mucous membranes or dry climate, medicines, nose picking, and home heating and cooling systems. Most nosebleeds come from blood vessels in the front of your nose. HOME CARE INSTRUCTIONS   Try controlling your nosebleed by pinching your nostrils gently and continuously for at least 10 minutes.  Avoid blowing or sniffing your nose for a number of hours after having a nosebleed.  Do not put gauze inside your nose yourself. If your nose was packed by your health care provider, try to maintain the pack inside of your nose until your health care provider removes it.  If a gauze pack was used and it starts to fall out, gently replace it or cut off the end of it.  If a balloon catheter was used to pack your nose, do not cut or remove it unless your health care provider has instructed you to do that.  Avoid lying down while you are having a nosebleed. Sit up and lean forward.  Use a nasal spray decongestant to help with a nosebleed as directed by your health care provider.  Do not use petroleum jelly or mineral oil in your nose. These can drip into your lungs.  Maintain humidity in  your home by using less air conditioning or by using a humidifier.  Aspirinand blood thinners make bleeding more likely. If you are prescribed these medicines and you suffer from nosebleeds, ask your health care provider if you should stop taking the medicines or adjust the dose. Do not stop medicines unless directed by your health care provider  Resume your normal activities as you are able, but avoid straining, lifting, or bending at the waist for several days.  If your nosebleed was caused by  dry mucous membranes, use over-the-counter saline nasal spray or gel. This will keep the mucous membranes moist and allow them to heal. If you must use a lubricant, choose the water-soluble variety. Use it only sparingly, and do not use it within several hours of lying down.  Keep all follow-up visits as directed by your health care provider. This is important. SEEK MEDICAL CARE IF:  You have a fever.  You get frequent nosebleeds.  You are getting nosebleeds more often. SEEK IMMEDIATE MEDICAL CARE IF:  Your nosebleed lasts longer than 20 minutes.  Your nosebleed occurs after an injury to your face, and your nose looks crooked or broken.  You have unusual bleeding from other parts of your body.  You have unusual bruising on other parts of your body.  You feel light-headed or you faint.  You become sweaty.  You vomit blood.  Your nosebleed occurs after a head injury.   This information is not intended to replace advice given to you by your health care provider. Make sure you discuss any questions you have with your health care provider.   Document Released: 10/17/2004 Document Revised: 01/28/2014 Document Reviewed: 08/23/2013 Elsevier Interactive Patient Education 2016 [employer] Inc.  Nasal Hygiene The nose has many positive effects on the air you breathe in that you may not be aware of. - Temperature regulation - Filtration and removal of particulate matter - Humidification - Defense against infections There are several things you can do to help keep your nose healthy. Foremost is nasal hygiene. This will help with your nose's natural function and keep it moist and healthy. Techniques to accomplish this are outlined below. These will help with nasal dryness, nasal crusting, and nose bleeds. They also assist with clearing thick mucus that cause you to blow your nose frequently and may be associated with thick postnasal drip.  1. Use nasal saline daily. You can buy small  bottles of this over-the-counter at the drug store or grocery store. Some brand names are: [address], [employer], [place]. Apply 2-3 sprays each nostril several times a day. If your nose feels dry or have had recent nasal surgery, try to use it every couple of hours. There is no medicine in it so it can be used as often as you like. Do NOT use the sprays containing decongestants. The appropriate way to apply nasal sprays: Place the nozzle just inside your nostril and point it towards the corner of your eye. Often, it is helpful to use the right-hand to spray into the left nasal cavity and use the left-hand to spray into the right side.  2. Use nasal saline irrigations and flushing.SinuCleanse, Simply Saline, [place]. Use this 1-2 times a day. We can also provide you with a recipe to use at home. Just ask [place]. To prevent reintroducing bacteria back into your nose, please keep your irrigation equipment clean and dry between uses. Throw away and replace reusable irrigation equipment every 3 weeks.   3. Use Vaseline petroleum jelly  or Aquaphor. You can apply this gently to each nostril 2-3 times a day to promote moisturization for your nose. You may also use triple antibiotic ointment such as Neosporin or Bacitracin. These can all be bought over-the-counter.  4. Consider other nasal emollients. A few preparations are available over-thecounter. Ponaris, Nose Better, Pretz. Ask your pharmacist what is available. Also, some nasal saline sprays have additives such as aloe and these are helpful.  5. Consider using a humidifier at home. If your nose feels dry and/or you have frequent nose bleeds, you can buy a humidifier for your home. Be cautious in using these if you have mold allergies.  6. Avoid excessive manual manipulation of your nose and nostrils. Frequent rubbing of your nostrils and the passing of tissues or fingers in your nostrils may aggravate nasal irritation from dryness and nose bleeds.

## 2015-01-27 DIAGNOSIS — R918 Other nonspecific abnormal finding of lung field: Secondary | ICD-10-CM | POA: Diagnosis not present

## 2015-01-27 DIAGNOSIS — Z6825 Body mass index (BMI) 25.0-25.9, adult: Secondary | ICD-10-CM | POA: Diagnosis not present

## 2015-01-27 DIAGNOSIS — R05 Cough: Secondary | ICD-10-CM | POA: Diagnosis not present

## 2015-01-27 DIAGNOSIS — R040 Epistaxis: Secondary | ICD-10-CM | POA: Diagnosis not present

## 2015-02-13 ENCOUNTER — Emergency Department (HOSPITAL_COMMUNITY): Payer: Medicare Other

## 2015-02-13 ENCOUNTER — Emergency Department (HOSPITAL_COMMUNITY)
Admission: EM | Admit: 2015-02-13 | Discharge: 2015-02-13 | Disposition: A | Discharge status: Home / Self Care | Payer: Medicare Other | Attending: Emergency Medicine | Admitting: Emergency Medicine

## 2015-02-13 DIAGNOSIS — M79601 Pain in right arm: Secondary | ICD-10-CM | POA: Diagnosis not present

## 2015-02-13 DIAGNOSIS — Y9389 Activity, other specified: Secondary | ICD-10-CM | POA: Diagnosis not present

## 2015-02-13 DIAGNOSIS — Z8669 Personal history of other diseases of the nervous system and sense organs: Secondary | ICD-10-CM | POA: Insufficient documentation

## 2015-02-13 DIAGNOSIS — Z7902 Long term (current) use of antithrombotics/antiplatelets: Secondary | ICD-10-CM | POA: Insufficient documentation

## 2015-02-13 DIAGNOSIS — S0990XA Unspecified injury of head, initial encounter: Secondary | ICD-10-CM | POA: Insufficient documentation

## 2015-02-13 DIAGNOSIS — I10 Essential (primary) hypertension: Secondary | ICD-10-CM | POA: Diagnosis not present

## 2015-02-13 DIAGNOSIS — Z79899 Other long term (current) drug therapy: Secondary | ICD-10-CM | POA: Insufficient documentation

## 2015-02-13 DIAGNOSIS — Z87891 Personal history of nicotine dependence: Secondary | ICD-10-CM | POA: Diagnosis not present

## 2015-02-13 DIAGNOSIS — I2510 Atherosclerotic heart disease of native coronary artery without angina pectoris: Secondary | ICD-10-CM | POA: Insufficient documentation

## 2015-02-13 DIAGNOSIS — E783 Hyperchylomicronemia: Secondary | ICD-10-CM | POA: Insufficient documentation

## 2015-02-13 DIAGNOSIS — Y9289 Other specified places as the place of occurrence of the external cause: Secondary | ICD-10-CM | POA: Insufficient documentation

## 2015-02-13 DIAGNOSIS — Y998 Other external cause status: Secondary | ICD-10-CM | POA: Insufficient documentation

## 2015-02-13 DIAGNOSIS — W01198A Fall on same level from slipping, tripping and stumbling with subsequent striking against other object, initial encounter: Secondary | ICD-10-CM | POA: Insufficient documentation

## 2015-02-13 DIAGNOSIS — S59901A Unspecified injury of right elbow, initial encounter: Secondary | ICD-10-CM | POA: Diagnosis not present

## 2015-02-13 DIAGNOSIS — M25421 Effusion, right elbow: Secondary | ICD-10-CM | POA: Diagnosis not present

## 2015-02-13 DIAGNOSIS — K219 Gastro-esophageal reflux disease without esophagitis: Secondary | ICD-10-CM | POA: Insufficient documentation

## 2015-02-13 DIAGNOSIS — Z862 Personal history of diseases of the blood and blood-forming organs and certain disorders involving the immune mechanism: Secondary | ICD-10-CM | POA: Diagnosis not present

## 2015-02-13 DIAGNOSIS — S4991XA Unspecified injury of right shoulder and upper arm, initial encounter: Secondary | ICD-10-CM | POA: Insufficient documentation

## 2015-02-13 DIAGNOSIS — Z87448 Personal history of other diseases of urinary system: Secondary | ICD-10-CM | POA: Insufficient documentation

## 2015-02-13 DIAGNOSIS — M25531 Pain in right wrist: Secondary | ICD-10-CM | POA: Diagnosis not present

## 2015-02-13 DIAGNOSIS — M25511 Pain in right shoulder: Secondary | ICD-10-CM

## 2015-02-13 DIAGNOSIS — W19XXXA Unspecified fall, initial encounter: Secondary | ICD-10-CM

## 2015-02-13 LAB — URINALYSIS, ROUTINE W REFLEX MICROSCOPIC
BILIRUBIN URINE: NEGATIVE
Glucose, UA: NEGATIVE mg/dL
Hgb urine dipstick: NEGATIVE
KETONES UR: NEGATIVE mg/dL
LEUKOCYTES UA: NEGATIVE
NITRITE: NEGATIVE
PH: 6.5 (ref 5.0–8.0)
PROTEIN: 30 mg/dL — AB
Specific Gravity, Urine: 1.024 (ref 1.005–1.030)

## 2015-02-13 LAB — CBC WITH DIFFERENTIAL/PLATELET
BASOS ABS: 0 10*3/uL (ref 0.0–0.1)
BASOS PCT: 0 %
EOS ABS: 0.1 10*3/uL (ref 0.0–0.7)
Eosinophils Relative: 1 %
HCT: 34.5 % — ABNORMAL LOW (ref 39.0–52.0)
Hemoglobin: 11.4 g/dL — ABNORMAL LOW (ref 13.0–17.0)
Lymphocytes Relative: 12 %
Lymphs Abs: 1.1 10*3/uL (ref 0.7–4.0)
MCH: 32.9 pg (ref 26.0–34.0)
MCHC: 33 g/dL (ref 30.0–36.0)
MCV: 99.4 fL (ref 78.0–100.0)
MONO ABS: 1.2 10*3/uL — AB (ref 0.1–1.0)
MONOS PCT: 13 %
NEUTROS ABS: 7 10*3/uL (ref 1.7–7.7)
Neutrophils Relative %: 74 %
PLATELETS: 193 10*3/uL (ref 150–400)
RBC: 3.47 MIL/uL — ABNORMAL LOW (ref 4.22–5.81)
RDW: 12.4 % (ref 11.5–15.5)
WBC: 9.5 10*3/uL (ref 4.0–10.5)

## 2015-02-13 LAB — BASIC METABOLIC PANEL
ANION GAP: 9 (ref 5–15)
BUN: 24 mg/dL — ABNORMAL HIGH (ref 6–20)
CALCIUM: 8.9 mg/dL (ref 8.9–10.3)
CO2: 24 mmol/L (ref 22–32)
CREATININE: 1.2 mg/dL (ref 0.61–1.24)
Chloride: 104 mmol/L (ref 101–111)
GFR, EST NON AFRICAN AMERICAN: 55 mL/min — AB (ref >60)
Glucose, Bld: 175 mg/dL — ABNORMAL HIGH (ref 65–99)
Potassium: 4 mmol/L (ref 3.5–5.1)
SODIUM: 137 mmol/L (ref 135–145)

## 2015-02-13 LAB — URINE MICROSCOPIC-ADD ON

## 2015-02-13 MED ORDER — ACETAMINOPHEN 500 MG PO TABS
1000.0000 mg | ORAL_TABLET | Freq: Once | ORAL | Status: AC
  Administered 2015-02-13: 1000 mg via ORAL
  Filled 2015-02-13: qty 2

## 2015-02-13 MED ORDER — HYDROCODONE-ACETAMINOPHEN 5-325 MG PO TABS: 1.0000 | ORAL_TABLET | Freq: Four times a day (QID) | ORAL | Status: DC | PRN

## 2015-02-13 NOTE — ED Notes (Signed)
Pt c/o dizziness and fall 3 days ago, c/o right arm pain worsened with ROM.

## 2015-02-13 NOTE — ED Notes (Signed)
Awake. Verbally responsive. A/O x4. Resp even and unlabored. No audible adventitious breath sounds noted. ABC's intact. SR on monitor. Family at bedside. 

## 2015-02-13 NOTE — Discharge Instructions (Signed)
Schedule a follow up appointment with orthopedics   Shoulder Pain The shoulder is the joint that connects your arms to your body. The bones that form the shoulder joint include the upper arm bone (humerus), the shoulder blade (scapula), and the collarbone (clavicle). The top of the humerus is shaped like a ball and fits into a rather flat socket on the scapula (glenoid cavity). A combination of muscles and strong, fibrous tissues that connect muscles to bones (tendons) support your shoulder joint and hold the ball in the socket. Small, fluid-filled sacs (bursae) are located in different areas of the joint. They act as cushions between the bones and the overlying soft tissues and help reduce friction between the gliding tendons and the bone as you move your arm. Your shoulder joint allows a wide range of motion in your arm. This range of motion allows you to do things like scratch your back or throw a ball. However, this range of motion also makes your shoulder more prone to pain from overuse and injury. Causes of shoulder pain can originate from both injury and overuse and usually can be grouped in the following four categories:  Redness, swelling, and pain (inflammation) of the tendon (tendinitis) or the bursae (bursitis).  Instability, such as a dislocation of the joint.  Inflammation of the joint (arthritis).  Broken bone (fracture). HOME CARE INSTRUCTIONS   Apply ice to the sore area.  Put ice in a plastic bag.  Place a towel between your skin and the bag.  Leave the ice on for 15-20 minutes, 3-4 times per day for the first 2 days, or as directed by your health care provider.  Stop using cold packs if they do not help with the pain.  If you have a shoulder sling or immobilizer, wear it as long as your caregiver instructs. Only remove it to shower or bathe. Move your arm as little as possible, but keep your hand moving to prevent swelling.  Squeeze a soft ball or foam pad as much as  possible to help prevent swelling.  Only take over-the-counter or prescription medicines for pain, discomfort, or fever as directed by your caregiver. SEEK MEDICAL CARE IF:   Your shoulder pain increases, or new pain develops in your arm, hand, or fingers.  Your hand or fingers become cold and numb.  Your pain is not relieved with medicines. SEEK IMMEDIATE MEDICAL CARE IF:   Your arm, hand, or fingers are numb or tingling.  Your arm, hand, or fingers are significantly swollen or turn white or blue. MAKE SURE YOU:   Understand these instructions.  Will watch your condition.  Will get help right away if you are not doing well or get worse.   This information is not intended to replace advice given to you by your health care provider. Make sure you discuss any questions you have with your health care provider.   Document Released: 10/17/2004 Document Revised: 01/28/2014 Document Reviewed: 05/02/2014 Elsevier Interactive Patient Education [employer]

## 2015-02-13 NOTE — ED Provider Notes (Signed)
CSN: [id number]     Arrival date & time 02/13/15  0744 History   First MD Initiated Contact with Patient 02/13/15 0802     Chief Complaint  Patient presents with  . Arm Pain   HPI  Shawn Sanchez is a 82 year old male with PMHx of HTN, reflux, CAD and anemia presenting with arm pain after a fall. He reports a fall 3 days ago. He was walking through his house when he became lightheaded and fell to the floor. He believes he fell backwards and struck his head on the floor. He denies LOC. He did not seek medical care at that time. He reports increasing right arm pain since the fall. The pain is located in his wrist, elbow and shoulder. He is not sure if the pain is radiating from one joint or if all the joints hurt. The pain is exacerbated by movement. He reports new weakness of his arm. He states that he can no longer place heavy objects on shelving or lift his arm above his head. He has to use his "good arm" to aid his left arm in movement. He denies history of rotator cuff issues. His daughter is at bedside and believes the fingers of his right hand look swollen. He denies numbness, tingling or loss of sensation in the arm. He states that he has episodes of lightheadedness that mostly occur when he gets out of bed or bends over to feed his cat. He does have a history of Meniere's disease but states these episodes are different. He has a history of CAD with stents placed. Chart review ofcardiology evaluation with Dr. Jens Som shows cath in 2009 showing non-obstructive CAD and patent stents, nuclear study in 2013 with normal perfusion and echo in 2015 showing normal LV function. He denies chest pain, SOB or nausea associated with his fall. He is on blood thinners (plavix). He is not complaining of headaches today. He states his only concern today is his arm pain. Denies fevers, chills, syncope, neck pain, visual disturbances, changes in hearing, chest pain, SOB, cough, abdominal pain, nausea, vomiting, back pain  or pain in the other extremities. He denies wounds sustained in the fall. His daughter is at bedside and reports he has been at his baseline mentation since the fall.   Past Medical History  Diagnosis Date  . Unspecified disorder of kidney and ureter   . Anemia, unspecified   . Hemorrhage of rectum and anus   . Unspecified essential hypertension   . Carpal tunnel syndrome   . Coronary atherosclerosis of unspecified type of vessel, native or graft   . Hyperchylomicronemia   . Esophageal reflux    Past Surgical History  Procedure Laterality Date  . Angioplasy/stent  2006  . Lamenectomy  1965   Family History  Problem Relation Age of Onset  . Breast cancer Mother   . Lung cancer Mother   . Diabetes Sister   . Lung cancer Father   . Coronary artery disease Father   . Hypertension Father   . Colon cancer Neg Hx    Social History  Substance Use Topics  . Smoking status: Former Smoker    Types: Pipe, Cigars    Quit date: 10/10/1999  . Smokeless tobacco: Never Used     Comment: cigars only in 2005  . Alcohol Use: Yes     Comment: Scotch. Stopped drinking 2-3 days ago.     Review of Systems  Musculoskeletal: Positive for arthralgias.  Neurological: Positive for  light-headedness.  All other systems reviewed and are negative.     Allergies  Novocain  Home Medications   Prior to Admission medications   Medication Sig Start Date End Date Taking? Authorizing Provider  Acetaminophen-DM (COLD & COUGH DAYTIME PO) Take 1 tablet by mouth every 6 (six) hours as needed (cold symptoms).   Yes Historical Provider, MD  carvedilol (COREG) 12.5 MG tablet Take 1 tablet (12.5 mg total) by mouth 2 (two) times daily with a meal. 10/28/11  Yes Lewayne Bunting, MD  cholecalciferol (VITAMIN D) 1000 UNITS tablet Take 1,000 Units by mouth daily.   Yes Historical Provider, MD  clopidogrel (PLAVIX) 75 MG tablet Take 75 mg by mouth at bedtime.  09/03/13  Yes Historical Provider, MD  escitalopram  (LEXAPRO) 10 MG tablet Take 10 mg by mouth daily.   Yes Historical Provider, MD  levothyroxine (SYNTHROID, LEVOTHROID) 50 MCG tablet Take 50 mcg by mouth daily.     Yes Historical Provider, MD  Melatonin 5 MG CAPS Take 1 capsule by mouth at bedtime.    Yes Historical Provider, MD  omeprazole (PRILOSEC) 20 MG capsule Take 20 mg by mouth every other day.  09/25/12  Yes Historical Provider, MD  simvastatin (ZOCOR) 20 MG tablet TAKE 1 TABLET BY MOUTH DAILY 12/22/13  Yes Lewayne Bunting, MD  Tamsulosin HCl (FLOMAX) 0.4 MG CAPS Take 0.4 mg by mouth daily.  07/20/11  Yes Historical Provider, MD  azithromycin (ZITHROMAX Z-PAK) 250 MG tablet 2 po day one, then 1 daily x 4 days 01/23/15   Arthor Captain, PA-C  HYDROcodone-acetaminophen (NORCO/VICODIN) 5-325 MG tablet Take 1 tablet by mouth every 6 (six) hours as needed. 02/13/15   Audra Kagel, PA-C   BP 132/78 mmHg  Pulse 68  Temp(Src) 98.7 F (37.1 C) (Oral)  Resp 17  SpO2 100% Physical Exam  Constitutional: He is oriented to person, place, and time. He appears well-developed and well-nourished. No distress.  HENT:  Head: Normocephalic and atraumatic.  Mouth/Throat: Oropharynx is clear and moist.  Eyes: Conjunctivae and EOM are normal. Pupils are equal, round, and reactive to light. Right eye exhibits no discharge. Left eye exhibits no discharge. No scleral icterus.  Neck: Normal range of motion.  No tenderness over cervical spine. FROM of neck intact.   Cardiovascular: Normal rate, regular rhythm, normal heart sounds and intact distal pulses.   Radial pulse palpable. Cap refill < 3 seconds.   Pulmonary/Chest: Effort normal and breath sounds normal. No respiratory distress. He has no wheezes. He has no rales.  Musculoskeletal:       Right shoulder: He exhibits decreased range of motion, tenderness and decreased strength. He exhibits no swelling, no crepitus, no deformity and normal pulse.  Generalized tenderness of right arm from elbow to shoulder. No  obvious edema or deformity. Restricted active ROM of shoulder. Pt uses left hand to lift his right arm when asked to extend his arm. Pt is able to actively extend arm approximately 30 degrees. FROM at elbow and wrist. Passive ROM at shoulder restricted secondary to pain.   Neurological: He is alert and oriented to person, place, and time. No cranial nerve deficit. He exhibits normal muscle tone. Coordination normal.  Cranial nerves 3-12 tested and intact. 5/5 grip strength bilaterally. Unable to assess shoulder and elbow strength secondary to pain. Sensation to light touch intact throughout.   Skin: Skin is warm and dry.  Psychiatric: He has a normal mood and affect. His behavior is normal.  Nursing  note and vitals reviewed.   ED Course  Procedures (including critical care time) Labs Review Labs Reviewed  CBC WITH DIFFERENTIAL/PLATELET - Abnormal; Notable for the following:    RBC 3.47 (*)    Hemoglobin 11.4 (*)    HCT 34.5 (*)    Monocytes Absolute 1.2 (*)    All other components within normal limits  BASIC METABOLIC PANEL - Abnormal; Notable for the following:    Glucose, Bld 175 (*)    BUN 24 (*)    GFR calc non Af Amer 55 (*)    All other components within normal limits  URINALYSIS, ROUTINE W REFLEX MICROSCOPIC (NOT AT [facility]) - Abnormal; Notable for the following:    Protein, ur 30 (*)    All other components within normal limits  URINE MICROSCOPIC-ADD ON - Abnormal; Notable for the following:    Squamous Epithelial / LPF 0-5 (*)    Bacteria, UA RARE (*)    All other components within normal limits    Imaging Review Dg Shoulder Right  02/13/2015  CLINICAL DATA:  Dizziness and fall 3 days ago at home with persistent right shoulder and elbow pain made worse with movement EXAM: RIGHT SHOULDER - 2+ VIEW COMPARISON:  Chest x-ray dated September 13, 2008 which included portions of the right shoulder. FINDINGS: The bones of the right shoulder are adequately mineralized. The glenohumeral  joint space is preserved. There is a small spur arising from the greater tuberosity of the humerus. There are degenerative changes of the [facility] joint with irregularity of the articular margins and surrounding soft tissue calcifications. The subacromial subdeltoid space is reasonably well maintained. The observed portions of the upper right ribs and of the scapula appear intact. IMPRESSION: No acute fracture nor dislocation is observed. There is significant degenerative change associated with the [facility] joint however. If rotator cuff or glenohumeral joint pathology are suspected clinically, MRI would be a useful next imaging step. Electronically Signed   By: David  [place] M.D.   On: 02/13/2015 08:23   Dg Elbow Complete Right  02/13/2015  CLINICAL DATA:  Fall. EXAM: RIGHT ELBOW - COMPLETE 3+ VIEW COMPARISON:  No recent prior. FINDINGS: Prominent elbow joint effusion. Extensive diffuse degenerative change present. This makes evaluation for fracture difficult. A fracture of the radial head cannot be completely excluded. CT or MRI may prove useful for further evaluation . IMPRESSION: 1. Large elbow joint effusion. 2. Extensive diffuse degenerative change present about the right elbow. This makes evaluation for fracture difficult. A radial head fracture cannot be completely excluded. Further evaluation with CT or MRI may prove useful for further evaluation. Electronically Signed   By: Maisie Fus  Register   On: 02/13/2015 08:23   Dg Wrist Complete Right  02/13/2015  CLINICAL DATA:  Dizziness, fall 3 days ago.  Generalized wrist pain. EXAM: RIGHT WRIST - COMPLETE 3+ VIEW COMPARISON:  None. FINDINGS: Degenerative changes at the first carpometacarpal joint, first MCP and IP joints with joint space narrowing and spurring. No acute bony abnormality. Specifically, no fracture, subluxation, or dislocation. Soft tissues are intact. Vascular calcifications throughout the soft tissues. IMPRESSION: Degenerative changes as above.  No  acute bony abnormality. Electronically Signed   By: Charlett Nose M.D.   On: 02/13/2015 08:56   I have personally reviewed and evaluated these images and lab results as part of my medical decision-making.   EKG Interpretation   Date/Time:  Monday February 13 2015 08:01:54 EST Ventricular Rate:  76 PR Interval:  170 QRS Duration:  85 QT Interval:  385 QTC Calculation: 433 R Axis:   58 Text Interpretation:  Sinus rhythm Borderline T abnormalities, anterior  leads Baseline wander in lead(s) V2 no change from previous Confirmed by  Donnald Garre, MD, Lebron Conners [phone]) on 02/13/2015 8:06:21 AM      MDM   Final diagnoses:  Right arm pain  Right shoulder pain   Patient presenting with right arm pain after a fall 3 days ago. Right arm is neurovascularly intact. Active ROM restricted and pt uses left hand to move right arm when asked to raise arm above head typical of rotator cuff pathology. Pt is able to stand and ambulate with a steady gait while in ED. Blood work and UA unremarkable. ECG unchanged from previous. Patient X-Ray negative for obvious fracture or dislocation. Elbow x-ray shows extensive degenerative changes and cannot fully rule out radial head fx without CT. Shoulder x-ray also shows degenerative changes at [facility] joint and recommend MRI for rotator cuff pathology. This imaging is reasonable to be done outpatient as ordered by his orthopedic doctor. Likely rotator cuff injury as cause of arm pain. No indication for head imaging at this time. Pain managed in ED with sling and vicodin. Pt advised to follow up with orthopedics for further evaluation. Pt has been evaluated by Dr. Donnald Garre who agrees with assessment and plan. Also encouraged to follow up with his PCP for orthostatic hypotension. Return precautions discussed at bedside and given in discharge paperwork. Pt is stable for discharge.    Alveta Heimlich, PA-C 02/13/15 1103  Arby Barrette, MD 02/13/15 1610

## 2015-02-13 NOTE — ED Notes (Addendum)
Stevi, PA at bedside.  

## 2015-02-13 NOTE — ED Notes (Signed)
Awake. Verbally responsive. A/O x4. Resp even and unlabored. No audible adventitious breath sounds noted. ABC's intact.  

## 2015-02-13 NOTE — ED Notes (Signed)
Pt reported dizziness without headache, visual disturbances, nausea or LOC and falling and hitting head. Pt reported rt arm pain. No deformity/bruising, (+)PMS, CRT brisk, swelling to rt shoulder, and LROM. Family at bedside.

## 2015-02-13 NOTE — ED Notes (Signed)
Patient in  X-ray unable to collect labs

## 2015-02-13 NOTE — ED Notes (Addendum)
Dr. Pfeiffer at bedside   

## 2015-02-13 NOTE — ED Notes (Signed)
Patient transported to X-ray 

## 2015-02-17 DIAGNOSIS — M25521 Pain in right elbow: Secondary | ICD-10-CM | POA: Diagnosis not present

## 2015-02-17 DIAGNOSIS — M79641 Pain in right hand: Secondary | ICD-10-CM | POA: Diagnosis not present

## 2015-02-17 DIAGNOSIS — M25511 Pain in right shoulder: Secondary | ICD-10-CM | POA: Diagnosis not present

## 2015-02-17 DIAGNOSIS — M25531 Pain in right wrist: Secondary | ICD-10-CM | POA: Diagnosis not present

## 2015-03-06 DIAGNOSIS — M25511 Pain in right shoulder: Secondary | ICD-10-CM | POA: Diagnosis not present

## 2015-03-06 DIAGNOSIS — M461 Sacroiliitis, not elsewhere classified: Secondary | ICD-10-CM | POA: Diagnosis not present

## 2015-03-06 DIAGNOSIS — M4126 Other idiopathic scoliosis, lumbar region: Secondary | ICD-10-CM | POA: Diagnosis not present

## 2015-03-06 DIAGNOSIS — M9907 Segmental and somatic dysfunction of upper extremity: Secondary | ICD-10-CM | POA: Diagnosis not present

## 2015-03-06 DIAGNOSIS — M19011 Primary osteoarthritis, right shoulder: Secondary | ICD-10-CM | POA: Diagnosis not present

## 2015-03-06 DIAGNOSIS — M9905 Segmental and somatic dysfunction of pelvic region: Secondary | ICD-10-CM | POA: Diagnosis not present

## 2015-03-06 DIAGNOSIS — M5106 Intervertebral disc disorders with myelopathy, lumbar region: Secondary | ICD-10-CM | POA: Diagnosis not present

## 2015-03-06 DIAGNOSIS — M9903 Segmental and somatic dysfunction of lumbar region: Secondary | ICD-10-CM | POA: Diagnosis not present

## 2015-03-06 DIAGNOSIS — M7551 Bursitis of right shoulder: Secondary | ICD-10-CM | POA: Diagnosis not present

## 2015-03-06 DIAGNOSIS — M5137 Other intervertebral disc degeneration, lumbosacral region: Secondary | ICD-10-CM | POA: Diagnosis not present

## 2015-03-06 DIAGNOSIS — M9904 Segmental and somatic dysfunction of sacral region: Secondary | ICD-10-CM | POA: Diagnosis not present

## 2015-03-09 DIAGNOSIS — M9905 Segmental and somatic dysfunction of pelvic region: Secondary | ICD-10-CM | POA: Diagnosis not present

## 2015-03-09 DIAGNOSIS — M7551 Bursitis of right shoulder: Secondary | ICD-10-CM | POA: Diagnosis not present

## 2015-03-09 DIAGNOSIS — M19011 Primary osteoarthritis, right shoulder: Secondary | ICD-10-CM | POA: Diagnosis not present

## 2015-03-09 DIAGNOSIS — M9903 Segmental and somatic dysfunction of lumbar region: Secondary | ICD-10-CM | POA: Diagnosis not present

## 2015-03-09 DIAGNOSIS — M25511 Pain in right shoulder: Secondary | ICD-10-CM | POA: Diagnosis not present

## 2015-03-09 DIAGNOSIS — M5137 Other intervertebral disc degeneration, lumbosacral region: Secondary | ICD-10-CM | POA: Diagnosis not present

## 2015-03-09 DIAGNOSIS — M461 Sacroiliitis, not elsewhere classified: Secondary | ICD-10-CM | POA: Diagnosis not present

## 2015-03-09 DIAGNOSIS — M4126 Other idiopathic scoliosis, lumbar region: Secondary | ICD-10-CM | POA: Diagnosis not present

## 2015-03-09 DIAGNOSIS — M5106 Intervertebral disc disorders with myelopathy, lumbar region: Secondary | ICD-10-CM | POA: Diagnosis not present

## 2015-03-09 DIAGNOSIS — M9904 Segmental and somatic dysfunction of sacral region: Secondary | ICD-10-CM | POA: Diagnosis not present

## 2015-03-09 DIAGNOSIS — M9907 Segmental and somatic dysfunction of upper extremity: Secondary | ICD-10-CM | POA: Diagnosis not present

## 2015-03-13 DIAGNOSIS — M7551 Bursitis of right shoulder: Secondary | ICD-10-CM | POA: Diagnosis not present

## 2015-03-13 DIAGNOSIS — M461 Sacroiliitis, not elsewhere classified: Secondary | ICD-10-CM | POA: Diagnosis not present

## 2015-03-13 DIAGNOSIS — M4126 Other idiopathic scoliosis, lumbar region: Secondary | ICD-10-CM | POA: Diagnosis not present

## 2015-03-13 DIAGNOSIS — M5137 Other intervertebral disc degeneration, lumbosacral region: Secondary | ICD-10-CM | POA: Diagnosis not present

## 2015-03-13 DIAGNOSIS — M9903 Segmental and somatic dysfunction of lumbar region: Secondary | ICD-10-CM | POA: Diagnosis not present

## 2015-03-13 DIAGNOSIS — M9905 Segmental and somatic dysfunction of pelvic region: Secondary | ICD-10-CM | POA: Diagnosis not present

## 2015-03-13 DIAGNOSIS — M25511 Pain in right shoulder: Secondary | ICD-10-CM | POA: Diagnosis not present

## 2015-03-13 DIAGNOSIS — M5106 Intervertebral disc disorders with myelopathy, lumbar region: Secondary | ICD-10-CM | POA: Diagnosis not present

## 2015-03-13 DIAGNOSIS — M9907 Segmental and somatic dysfunction of upper extremity: Secondary | ICD-10-CM | POA: Diagnosis not present

## 2015-03-13 DIAGNOSIS — M9904 Segmental and somatic dysfunction of sacral region: Secondary | ICD-10-CM | POA: Diagnosis not present

## 2015-03-13 DIAGNOSIS — M19011 Primary osteoarthritis, right shoulder: Secondary | ICD-10-CM | POA: Diagnosis not present

## 2015-03-14 ENCOUNTER — Other Ambulatory Visit: Payer: Self-pay | Admitting: Cardiology

## 2015-03-15 DIAGNOSIS — M9903 Segmental and somatic dysfunction of lumbar region: Secondary | ICD-10-CM | POA: Diagnosis not present

## 2015-03-15 DIAGNOSIS — M5137 Other intervertebral disc degeneration, lumbosacral region: Secondary | ICD-10-CM | POA: Diagnosis not present

## 2015-03-15 DIAGNOSIS — M4126 Other idiopathic scoliosis, lumbar region: Secondary | ICD-10-CM | POA: Diagnosis not present

## 2015-03-15 DIAGNOSIS — M7551 Bursitis of right shoulder: Secondary | ICD-10-CM | POA: Diagnosis not present

## 2015-03-15 DIAGNOSIS — M25511 Pain in right shoulder: Secondary | ICD-10-CM | POA: Diagnosis not present

## 2015-03-15 DIAGNOSIS — M461 Sacroiliitis, not elsewhere classified: Secondary | ICD-10-CM | POA: Diagnosis not present

## 2015-03-15 DIAGNOSIS — M5106 Intervertebral disc disorders with myelopathy, lumbar region: Secondary | ICD-10-CM | POA: Diagnosis not present

## 2015-03-15 DIAGNOSIS — M9907 Segmental and somatic dysfunction of upper extremity: Secondary | ICD-10-CM | POA: Diagnosis not present

## 2015-03-15 DIAGNOSIS — M19011 Primary osteoarthritis, right shoulder: Secondary | ICD-10-CM | POA: Diagnosis not present

## 2015-03-15 DIAGNOSIS — M9905 Segmental and somatic dysfunction of pelvic region: Secondary | ICD-10-CM | POA: Diagnosis not present

## 2015-03-15 DIAGNOSIS — M9904 Segmental and somatic dysfunction of sacral region: Secondary | ICD-10-CM | POA: Diagnosis not present

## 2015-03-15 NOTE — Telephone Encounter (Signed)
Rx(s) sent to pharmacy electronically.  

## 2015-03-16 DIAGNOSIS — M5106 Intervertebral disc disorders with myelopathy, lumbar region: Secondary | ICD-10-CM | POA: Diagnosis not present

## 2015-03-16 DIAGNOSIS — M9907 Segmental and somatic dysfunction of upper extremity: Secondary | ICD-10-CM | POA: Diagnosis not present

## 2015-03-16 DIAGNOSIS — M19011 Primary osteoarthritis, right shoulder: Secondary | ICD-10-CM | POA: Diagnosis not present

## 2015-03-16 DIAGNOSIS — M9904 Segmental and somatic dysfunction of sacral region: Secondary | ICD-10-CM | POA: Diagnosis not present

## 2015-03-16 DIAGNOSIS — M4126 Other idiopathic scoliosis, lumbar region: Secondary | ICD-10-CM | POA: Diagnosis not present

## 2015-03-16 DIAGNOSIS — M5137 Other intervertebral disc degeneration, lumbosacral region: Secondary | ICD-10-CM | POA: Diagnosis not present

## 2015-03-16 DIAGNOSIS — M9905 Segmental and somatic dysfunction of pelvic region: Secondary | ICD-10-CM | POA: Diagnosis not present

## 2015-03-16 DIAGNOSIS — M7551 Bursitis of right shoulder: Secondary | ICD-10-CM | POA: Diagnosis not present

## 2015-03-16 DIAGNOSIS — M25511 Pain in right shoulder: Secondary | ICD-10-CM | POA: Diagnosis not present

## 2015-03-16 DIAGNOSIS — M461 Sacroiliitis, not elsewhere classified: Secondary | ICD-10-CM | POA: Diagnosis not present

## 2015-03-16 DIAGNOSIS — M9903 Segmental and somatic dysfunction of lumbar region: Secondary | ICD-10-CM | POA: Diagnosis not present

## 2015-03-20 DIAGNOSIS — M4126 Other idiopathic scoliosis, lumbar region: Secondary | ICD-10-CM | POA: Diagnosis not present

## 2015-03-20 DIAGNOSIS — M9905 Segmental and somatic dysfunction of pelvic region: Secondary | ICD-10-CM | POA: Diagnosis not present

## 2015-03-20 DIAGNOSIS — M9904 Segmental and somatic dysfunction of sacral region: Secondary | ICD-10-CM | POA: Diagnosis not present

## 2015-03-20 DIAGNOSIS — M5106 Intervertebral disc disorders with myelopathy, lumbar region: Secondary | ICD-10-CM | POA: Diagnosis not present

## 2015-03-20 DIAGNOSIS — M25511 Pain in right shoulder: Secondary | ICD-10-CM | POA: Diagnosis not present

## 2015-03-20 DIAGNOSIS — M19011 Primary osteoarthritis, right shoulder: Secondary | ICD-10-CM | POA: Diagnosis not present

## 2015-03-20 DIAGNOSIS — M9907 Segmental and somatic dysfunction of upper extremity: Secondary | ICD-10-CM | POA: Diagnosis not present

## 2015-03-20 DIAGNOSIS — M461 Sacroiliitis, not elsewhere classified: Secondary | ICD-10-CM | POA: Diagnosis not present

## 2015-03-20 DIAGNOSIS — M9903 Segmental and somatic dysfunction of lumbar region: Secondary | ICD-10-CM | POA: Diagnosis not present

## 2015-03-20 DIAGNOSIS — M5137 Other intervertebral disc degeneration, lumbosacral region: Secondary | ICD-10-CM | POA: Diagnosis not present

## 2015-03-20 DIAGNOSIS — M7551 Bursitis of right shoulder: Secondary | ICD-10-CM | POA: Diagnosis not present

## 2015-03-21 DIAGNOSIS — M25511 Pain in right shoulder: Secondary | ICD-10-CM | POA: Diagnosis not present

## 2015-03-21 DIAGNOSIS — M9907 Segmental and somatic dysfunction of upper extremity: Secondary | ICD-10-CM | POA: Diagnosis not present

## 2015-03-21 DIAGNOSIS — M9904 Segmental and somatic dysfunction of sacral region: Secondary | ICD-10-CM | POA: Diagnosis not present

## 2015-03-21 DIAGNOSIS — M19011 Primary osteoarthritis, right shoulder: Secondary | ICD-10-CM | POA: Diagnosis not present

## 2015-03-21 DIAGNOSIS — M9905 Segmental and somatic dysfunction of pelvic region: Secondary | ICD-10-CM | POA: Diagnosis not present

## 2015-03-21 DIAGNOSIS — M461 Sacroiliitis, not elsewhere classified: Secondary | ICD-10-CM | POA: Diagnosis not present

## 2015-03-21 DIAGNOSIS — M4126 Other idiopathic scoliosis, lumbar region: Secondary | ICD-10-CM | POA: Diagnosis not present

## 2015-03-21 DIAGNOSIS — M7551 Bursitis of right shoulder: Secondary | ICD-10-CM | POA: Diagnosis not present

## 2015-03-21 DIAGNOSIS — M9903 Segmental and somatic dysfunction of lumbar region: Secondary | ICD-10-CM | POA: Diagnosis not present

## 2015-03-21 DIAGNOSIS — M5106 Intervertebral disc disorders with myelopathy, lumbar region: Secondary | ICD-10-CM | POA: Diagnosis not present

## 2015-03-21 DIAGNOSIS — M5137 Other intervertebral disc degeneration, lumbosacral region: Secondary | ICD-10-CM | POA: Diagnosis not present

## 2015-03-23 DIAGNOSIS — M9905 Segmental and somatic dysfunction of pelvic region: Secondary | ICD-10-CM | POA: Diagnosis not present

## 2015-03-23 DIAGNOSIS — M461 Sacroiliitis, not elsewhere classified: Secondary | ICD-10-CM | POA: Diagnosis not present

## 2015-03-23 DIAGNOSIS — M5106 Intervertebral disc disorders with myelopathy, lumbar region: Secondary | ICD-10-CM | POA: Diagnosis not present

## 2015-03-23 DIAGNOSIS — M4126 Other idiopathic scoliosis, lumbar region: Secondary | ICD-10-CM | POA: Diagnosis not present

## 2015-03-23 DIAGNOSIS — M5137 Other intervertebral disc degeneration, lumbosacral region: Secondary | ICD-10-CM | POA: Diagnosis not present

## 2015-03-23 DIAGNOSIS — M9904 Segmental and somatic dysfunction of sacral region: Secondary | ICD-10-CM | POA: Diagnosis not present

## 2015-03-23 DIAGNOSIS — M9907 Segmental and somatic dysfunction of upper extremity: Secondary | ICD-10-CM | POA: Diagnosis not present

## 2015-03-23 DIAGNOSIS — M7551 Bursitis of right shoulder: Secondary | ICD-10-CM | POA: Diagnosis not present

## 2015-03-23 DIAGNOSIS — M25511 Pain in right shoulder: Secondary | ICD-10-CM | POA: Diagnosis not present

## 2015-03-23 DIAGNOSIS — M19011 Primary osteoarthritis, right shoulder: Secondary | ICD-10-CM | POA: Diagnosis not present

## 2015-03-23 DIAGNOSIS — M9903 Segmental and somatic dysfunction of lumbar region: Secondary | ICD-10-CM | POA: Diagnosis not present

## 2015-03-23 DIAGNOSIS — M9901 Segmental and somatic dysfunction of cervical region: Secondary | ICD-10-CM | POA: Diagnosis not present

## 2015-03-23 DIAGNOSIS — M50122 Cervical disc disorder at C5-C6 level with radiculopathy: Secondary | ICD-10-CM | POA: Diagnosis not present

## 2015-03-27 DIAGNOSIS — M5106 Intervertebral disc disorders with myelopathy, lumbar region: Secondary | ICD-10-CM | POA: Diagnosis not present

## 2015-03-27 DIAGNOSIS — M9903 Segmental and somatic dysfunction of lumbar region: Secondary | ICD-10-CM | POA: Diagnosis not present

## 2015-03-27 DIAGNOSIS — M461 Sacroiliitis, not elsewhere classified: Secondary | ICD-10-CM | POA: Diagnosis not present

## 2015-03-27 DIAGNOSIS — M9904 Segmental and somatic dysfunction of sacral region: Secondary | ICD-10-CM | POA: Diagnosis not present

## 2015-03-27 DIAGNOSIS — M9905 Segmental and somatic dysfunction of pelvic region: Secondary | ICD-10-CM | POA: Diagnosis not present

## 2015-03-27 DIAGNOSIS — M5137 Other intervertebral disc degeneration, lumbosacral region: Secondary | ICD-10-CM | POA: Diagnosis not present

## 2015-03-27 DIAGNOSIS — M19011 Primary osteoarthritis, right shoulder: Secondary | ICD-10-CM | POA: Diagnosis not present

## 2015-03-27 DIAGNOSIS — M7551 Bursitis of right shoulder: Secondary | ICD-10-CM | POA: Diagnosis not present

## 2015-03-27 DIAGNOSIS — M4126 Other idiopathic scoliosis, lumbar region: Secondary | ICD-10-CM | POA: Diagnosis not present

## 2015-03-27 DIAGNOSIS — M9907 Segmental and somatic dysfunction of upper extremity: Secondary | ICD-10-CM | POA: Diagnosis not present

## 2015-03-27 DIAGNOSIS — M25511 Pain in right shoulder: Secondary | ICD-10-CM | POA: Diagnosis not present

## 2015-03-28 DIAGNOSIS — M9903 Segmental and somatic dysfunction of lumbar region: Secondary | ICD-10-CM | POA: Diagnosis not present

## 2015-03-28 DIAGNOSIS — M7551 Bursitis of right shoulder: Secondary | ICD-10-CM | POA: Diagnosis not present

## 2015-03-28 DIAGNOSIS — M9907 Segmental and somatic dysfunction of upper extremity: Secondary | ICD-10-CM | POA: Diagnosis not present

## 2015-03-28 DIAGNOSIS — M5106 Intervertebral disc disorders with myelopathy, lumbar region: Secondary | ICD-10-CM | POA: Diagnosis not present

## 2015-03-28 DIAGNOSIS — M25511 Pain in right shoulder: Secondary | ICD-10-CM | POA: Diagnosis not present

## 2015-03-28 DIAGNOSIS — M19011 Primary osteoarthritis, right shoulder: Secondary | ICD-10-CM | POA: Diagnosis not present

## 2015-03-28 DIAGNOSIS — M4126 Other idiopathic scoliosis, lumbar region: Secondary | ICD-10-CM | POA: Diagnosis not present

## 2015-03-28 DIAGNOSIS — M5137 Other intervertebral disc degeneration, lumbosacral region: Secondary | ICD-10-CM | POA: Diagnosis not present

## 2015-03-28 DIAGNOSIS — M461 Sacroiliitis, not elsewhere classified: Secondary | ICD-10-CM | POA: Diagnosis not present

## 2015-03-28 DIAGNOSIS — M9904 Segmental and somatic dysfunction of sacral region: Secondary | ICD-10-CM | POA: Diagnosis not present

## 2015-03-28 DIAGNOSIS — M9905 Segmental and somatic dysfunction of pelvic region: Secondary | ICD-10-CM | POA: Diagnosis not present

## 2015-04-04 DIAGNOSIS — M79641 Pain in right hand: Secondary | ICD-10-CM | POA: Diagnosis not present

## 2015-04-04 DIAGNOSIS — M25521 Pain in right elbow: Secondary | ICD-10-CM | POA: Diagnosis not present

## 2015-04-04 DIAGNOSIS — M25511 Pain in right shoulder: Secondary | ICD-10-CM | POA: Diagnosis not present

## 2015-04-04 DIAGNOSIS — M25531 Pain in right wrist: Secondary | ICD-10-CM | POA: Diagnosis not present

## 2015-04-05 ENCOUNTER — Other Ambulatory Visit: Payer: Self-pay | Admitting: Orthopaedic Surgery

## 2015-04-05 DIAGNOSIS — M25511 Pain in right shoulder: Secondary | ICD-10-CM

## 2015-04-13 ENCOUNTER — Ambulatory Visit
Admission: RE | Admit: 2015-04-13 | Discharge: 2015-04-13 | Disposition: A | Discharge status: Home / Self Care | Payer: Medicare Other | Source: Ambulatory Visit | Attending: Orthopaedic Surgery | Admitting: Orthopaedic Surgery

## 2015-04-13 DIAGNOSIS — M25511 Pain in right shoulder: Secondary | ICD-10-CM

## 2015-04-13 DIAGNOSIS — S46011A Strain of muscle(s) and tendon(s) of the rotator cuff of right shoulder, initial encounter: Secondary | ICD-10-CM | POA: Diagnosis not present

## 2015-04-28 DIAGNOSIS — M25511 Pain in right shoulder: Secondary | ICD-10-CM | POA: Diagnosis not present

## 2015-04-28 DIAGNOSIS — M1611 Unilateral primary osteoarthritis, right hip: Secondary | ICD-10-CM | POA: Diagnosis not present

## 2015-05-08 DIAGNOSIS — M25551 Pain in right hip: Secondary | ICD-10-CM | POA: Diagnosis not present

## 2015-06-20 ENCOUNTER — Other Ambulatory Visit: Payer: Self-pay | Admitting: Cardiology

## 2015-07-01 ENCOUNTER — Ambulatory Visit (INDEPENDENT_AMBULATORY_CARE_PROVIDER_SITE_OTHER): Payer: Medicare Other | Admitting: Internal Medicine

## 2015-07-01 VITALS — BP 104/70 | HR 67 | Temp 98.0°F | Resp 16 | Ht 66.0 in | Wt 161.0 lb

## 2015-07-01 DIAGNOSIS — K4090 Unilateral inguinal hernia, without obstruction or gangrene, not specified as recurrent: Secondary | ICD-10-CM

## 2015-07-01 NOTE — Patient Instructions (Signed)
We will set up surgery referral for hernia  neurosurgery for spinal stenosis: [place]--Dr Marikay Alaravid Jones  [place]--Dr Donette Larryharles [facility]-in [place]--Dr Erroll Lunahristopher Brown ---you might ask for referral from your primary care provider

## 2015-07-01 NOTE — Progress Notes (Signed)
Subjective:  By signing my name below, I, Shawn Sanchez, attest that this documentation has been prepared under the direction and in the presence of Shawn Sia, MD.  Electronically Signed: Andrew Sanchez, ED Scribe. 07/01/2015. 2:48 PM.   Patient ID: Shawn Sanchez, male    DOB: 08-07-1931, 82 y.o.   MRN: [id number]  HPI   Chief Complaint  Patient presents with  . Groin Pain    Describes pain as piercing and burning  . Urinary Frequency    HPI Comments: Shawn Sanchez is a 82 y.o. male who presents to the [facility] complaining of intermittent, piercing left groin pain with swelling that began today. Pt states this has been an ongoing problem for several years but states this time pain has become severe. He has pain with certain movements. He also notes a burning sensation to the skin of groin. Pt reports pressure to area with urination and nocturia for the past week. Pt ia taking flomax and has been on this medication for many years  Pt denies hx of kidney stone. Pt denies testicular pain.   Pt also reports being in pain due to spinal stenosis. He reports pain radiating down bilateral, lateral hips and thighs making it difficult for him to stand for long periods of time. Pt ambulates with a walker. Interferes greatly with life. Has seen ortho but no clear ideas.  Patient Active Problem List   Diagnosis Date Noted  . DEPRESSION 04/17/2009  . CAD, NATIVE VESSEL 09/21/2008  . RENAL DISEASE, CHRONIC 04/19/2008  . ANEMIA-UNSPECIFIED 11/30/2007  . RECTAL BLEEDING 11/30/2007  . FLATULENCE 11/30/2007  . FECAL OCCULT BLOOD 11/30/2007  . HYPERLIPIDEMIA 11/25/2007  . CARPAL TUNNEL SYNDROME 11/25/2007  . Essential hypertension 11/25/2007  . CAD 11/25/2007  . GERD 11/25/2007  . COUGH, CHRONIC 11/25/2007   Past Medical History  Diagnosis Date  . Unspecified disorder of kidney and ureter   . Anemia, unspecified   . Hemorrhage of rectum and anus   . Unspecified  essential hypertension   . Carpal tunnel syndrome   . Coronary atherosclerosis of unspecified type of vessel, native or graft   . Hyperchylomicronemia   . Esophageal reflux    Past Surgical History  Procedure Laterality Date  . Angioplasy/stent  2006  . Lamenectomy  1965   Allergies  Allergen Reactions  . Novocain [Procaine] Other (See Comments)    With epi---chest pain   Prior to Admission medications   Medication Sig Start Date End Date Taking? Authorizing Provider  carvedilol (COREG) 12.5 MG tablet Take 1 tablet (12.5 mg total) by mouth 2 (two) times daily with a meal. 10/28/11  Yes Shawn Bunting, MD  clopidogrel (PLAVIX) 75 MG tablet Take 75 mg by mouth at bedtime.  09/03/13  Yes Historical Provider, MD  levothyroxine (SYNTHROID, LEVOTHROID) 50 MCG tablet Take 50 mcg by mouth daily.     Yes Historical Provider, MD  omeprazole (PRILOSEC) 20 MG capsule Take 20 mg by mouth every other day.  09/25/12  Yes Historical Provider, MD  simvastatin (ZOCOR) 20 MG tablet TAKE 1 TABLET BY MOUTH DAILY 06/20/15  Yes Shawn Bunting, MD  Tamsulosin HCl (FLOMAX) 0.4 MG CAPS Take 0.4 mg by mouth daily.  07/20/11  Yes Historical Provider, MD  cholecalciferol (VITAMIN D) 1000 UNITS tablet Take 1,000 Units by mouth daily. Reported on 07/01/2015    Historical Provider, MD  escitalopram (LEXAPRO) 10 MG tablet Take 10 mg by mouth daily. Reported on  07/01/2015    Historical Provider, MD  HYDROcodone-acetaminophen (NORCO/VICODIN) 5-325 MG tablet Take 1 tablet by mouth every 6 (six) hours as needed. Patient not taking: Reported on 07/01/2015 02/13/15   Shawn HeimlichStevi Barrett, PA-C  Melatonin 5 MG CAPS Take 1 capsule by mouth at bedtime. Reported on 07/01/2015    Historical Provider, MD   Social History   Social History  . Marital Status: Married    Spouse Name: N/A  . Number of Children: N/A  . Years of Education: N/A   Occupational History  . Not on file.   Social History Main Topics  . Smoking status: Former  Smoker    Types: Pipe, Cigars    Quit date: 10/10/1999  . Smokeless tobacco: Never Used     Comment: cigars only in 2005  . Alcohol Use: Yes     Comment: Scotch. Stopped drinking 2-3 days ago.   . Drug Use: No  . Sexual Activity: Not on file   Other Topics Concern  . Not on file   Social History Narrative   ** Merged History Encounter **       Retired; divorced. Daily Caffeine use- 6-7 cups. Pt does not get regular exercise.    Review of Systems  Constitutional: Negative for fever and chills.  Genitourinary: Positive for frequency. Negative for dysuria, scrotal swelling and testicular pain.   mostly nocturia//put on hytrin by uro but not much effect--no dysuria No incontinence  Objective:   Physical Exam  Constitutional: He is oriented to person, place, and time. He appears well-developed and well-nourished. No distress.  HENT:  Head: Normocephalic and atraumatic.  Eyes: Conjunctivae and EOM are normal.  Neck: Neck supple.  Cardiovascular: Normal rate.   Pulmonary/Chest: Effort normal.  Genitourinary:  On initial inspection, left lower abdomen extending into groin is swollen compared to right without redness. A 3x3 cm mass is palpated just above inguinal line that is easily reducible when supine by applying pressure. Thi relieves his discomfort.   Musculoskeletal: Normal range of motion.  Neurological: He is alert and oriented to person, place, and time.  Skin: Skin is warm and dry.  Psychiatric: He has a normal mood and affect. His behavior is normal.  Nursing note and vitals reviewed.   Filed Vitals:   07/01/15 1413  BP: 104/70  Pulse: 67  Temp: 98 F (36.7 C)  TempSrc: Oral  Resp: 16  Height: 5\' 6"  (1.676 m)  Weight: 161 lb (73.029 kg)  SpO2: 98%     Assessment & Plan:  Direct inguinal hernia - Plan: Ambulatory referral to General Surgery  Showed him reduction method  Gave names of specialists for Spinal Stenosis He will f/u with urol about failure of  tamulosin

## 2015-07-13 ENCOUNTER — Ambulatory Visit: Payer: Self-pay | Admitting: General Surgery

## 2015-07-13 DIAGNOSIS — K4090 Unilateral inguinal hernia, without obstruction or gangrene, not specified as recurrent: Secondary | ICD-10-CM | POA: Diagnosis not present

## 2015-07-13 NOTE — H&P (Signed)
History of Present Illness Shawn Sanchez(Shawn Mckellips MD; 07/13/2015 10:16 AM) The patient is a 82 year old male who presents with an inguinal hernia. The patient is a 82 year old male who is referred by Dr. Ellamae Siaobert Sanchez for evaluation of a left inguinal hernia. Patient states she's noticed hernia there for possibly one to 2 years. He states that its causing some bulging. Is also causing some pain. He did have recurrence re-incarceration here recently and this was reduced.  Patient sees Dr. Ludwig Sanchez's cardiologist. He's had previous angioplasty and stent placement. He is currently on Plavix.   Other Problems Shawn Sanchez(Shawn Sanchez, CMA; 07/13/2015 10:03 AM) Alcohol Abuse Arthritis Back Pain Cerebrovascular Accident Depression Gastroesophageal Reflux Disease Hemorrhoids High blood pressure Hypercholesterolemia Myocardial infarction Other disease, cancer, significant illness  Past Surgical History Shawn Sanchez(Shawn Sanchez, CMA; 07/13/2015 10:03 AM) Oral Surgery Spinal Surgery - Lower Back Vasectomy  Diagnostic Studies History Shawn Sanchez(Shawn Sanchez, CMA; 07/13/2015 10:03 AM) Colonoscopy 1-5 years ago  Allergies (Shawn Sanchez, CMA; 07/13/2015 10:04 AM) Aricept *PSYCHOTHERAPEUTIC AND NEUROLOGICAL AGENTS - MISC.* Procaine HCl *LOCAL ANESTHETICS-Parenteral*  Medication History (Shawn Sanchez, CMA; 07/13/2015 10:05 AM) Carvedilol (3.125MG  Tablet, Oral) Active. Clopidogrel Bisulfate (75MG  Tablet, Oral) Active. Escitalopram Oxalate (10MG  Tablet, Oral) Active. Omeprazole (20MG  Capsule DR, Oral) Active. Simvastatin (20MG  Tablet, Oral) Active. Synthroid (50MCG Tablet, Oral) Active. Tamsulosin HCl (0.4MG  Capsule, Oral) Active. Medications Reconciled  Social History Shawn Sanchez(Shawn Sanchez, CMA; 07/13/2015 10:03 AM) Alcohol use Heavy alcohol use. Caffeine use Coffee, Tea. No drug use Tobacco use Former smoker.  Family History Shawn Sanchez(Shawn Sanchez, CMA; 07/13/2015 10:03 AM) Family history unknown First Degree  Relatives    Review of Systems Shawn Sanchez(Shawn Sanchez CMA; 07/13/2015 10:03 AM) General Present- Appetite Loss and Fatigue. Not Present- Chills, Fever, Night Sweats, Weight Gain and Weight Loss. Skin Present- Dryness. Not Present- Change in Wart/Mole, Hives, Jaundice, New Lesions, Non-Healing Wounds, Rash and Ulcer. HEENT Present- Hearing Loss, Hoarseness, Ringing in the Ears and Wears glasses/contact lenses. Not Present- Earache, Nose Bleed, Oral Ulcers, Seasonal Allergies, Sinus Pain, Sore Throat, Visual Disturbances and Yellow Eyes. Respiratory Not Present- Bloody sputum, Chronic Cough, Difficulty Breathing, Snoring and Wheezing. Breast Not Present- Breast Mass, Breast Pain, Nipple Discharge and Skin Changes. Cardiovascular Present- Shortness of Breath. Not Present- Chest Pain, Difficulty Breathing Lying Down, Leg Cramps, Palpitations, Rapid Heart Rate and Swelling of Extremities. Gastrointestinal Present- Chronic diarrhea and Excessive gas. Not Present- Abdominal Pain, Bloating, Bloody Stool, Change in Bowel Habits, Constipation, Difficulty Swallowing, Gets full quickly at meals, Hemorrhoids, Indigestion, Nausea, Rectal Pain and Vomiting. Male Genitourinary Present- Impotence and Nocturia. Not Present- Blood in Urine, Change in Urinary Stream, Frequency, Painful Urination, Urgency and Urine Leakage. Musculoskeletal Present- Back Pain, Muscle Pain and Muscle Weakness. Not Present- Joint Pain, Joint Stiffness and Swelling of Extremities. Neurological Present- Decreased Memory, Numbness, Tingling, Tremor and Weakness. Not Present- Fainting, Headaches, Seizures and Trouble walking. Psychiatric Present- Depression. Not Present- Anxiety, Bipolar, Change in Sleep Pattern, Fearful and Frequent crying. Endocrine Present- Cold Intolerance. Not Present- Excessive Hunger, Hair Changes, Heat Intolerance, Hot flashes and New Diabetes. Hematology Present- Excessive bleeding. Not Present- Easy Bruising, Gland problems,  HIV and Persistent Infections.  Vitals (Shawn Sanchez CMA; 07/13/2015 10:03 AM) 07/13/2015 10:03 AM Weight: 154 lb Height: 66in Body Surface Area: 1.79 m Body Mass Index: 24.86 kg/m  Temp.: 58F(Temporal)  Pulse: 73 (Regular)  BP: 118/82 (Sitting, Left Arm, Standard)       Physical Exam Shawn Sanchez(Shawn Bartley, MD; 07/13/2015 10:17 AM) General Mental Status-Alert. General Appearance-Consistent with stated age. Hydration-Well hydrated. Voice-Normal.  Head and Neck Head-normocephalic, atraumatic with no lesions or palpable masses. Trachea-midline.  Eye Eyeball - Bilateral-Extraocular movements intact. Sclera/Conjunctiva - Bilateral-No scleral icterus.  Chest and Lung Exam Chest and lung exam reveals -quiet, even and easy respiratory effort with no use of accessory muscles. Inspection Chest Wall - Normal. Back - normal.  Cardiovascular Cardiovascular examination reveals -normal heart sounds, regular rate and rhythm with no murmurs.  Abdomen Inspection Skin - Scar - no surgical scars. Hernias - Inguinal hernia - Left - Reducible. Palpation/Percussion Normal exam - Soft, Non Tender, No Rebound tenderness, No Rigidity (guarding) and No hepatosplenomegaly. Auscultation Normal exam - Bowel sounds normal.  Neurologic Neurologic evaluation reveals -alert and oriented x 3 with no impairment of recent or remote memory. Mental Status-Normal.  Musculoskeletal Normal Exam - Left-Upper Extremity Strength Normal and Lower Extremity Strength Normal. Normal Exam - Right-Upper Extremity Strength Normal, Lower Extremity Weakness.    Assessment & Plan Shawn Sanchez(Shawn Magoon MD; 07/13/2015 10:17 AM) LEFT INGUINAL HERNIA (K40.90) Impression: Patient is an 82 year old male with a left inguinal hernia. Patient also has a history of stent placement sees Dr. Jens Sanchez and is on Plavix.  1. Will obtain cardiac clearance from Dr. Jens Sanchez prior to scheduling  surgery 2. The patient will like to proceed to the operating room for laparoscopic left inguinal hernia repair with mesh.  3. I discussed with the patient the signs and symptoms of incarceration and strangulation and the need to proceed to the ER should they occur.  4. I discussed with the patient the risks and benefits of the procedure to include but not limited to: Infection, bleeding, damage to surrounding structures, possible need for further surgery, possible nerve pain, and possible recurrence. The patient was understanding and wishes to proceed.

## 2015-07-18 ENCOUNTER — Ambulatory Visit: Payer: Medicare Other | Admitting: Cardiology

## 2015-07-20 ENCOUNTER — Telehealth: Payer: Self-pay

## 2015-07-20 NOTE — Telephone Encounter (Signed)
According to the chart, Shawn CornJohn Russo, MD is the patient's PCP.  It appears that Dr. Merla Richesoolittle saw this patient one time, 07/01/2015 for groin pain and was diagnosed with a hernia and referred to general surgery. There are no other visits at this practice since our EMR go-live 02/19/2011.   The orders should come from someone who takes care of this patient currently, ideally his PCP.

## 2015-07-20 NOTE — Telephone Encounter (Signed)
Ok to order in Doolittle's name?

## 2015-07-20 NOTE — Telephone Encounter (Signed)
Caretaker Faith called - he needs a wheelchair and hospital bed.  Can you contact [facility] on behalf of patient.   Dr. Merla Richesoolittle last saw patient -  Rushie GoltzFaith   [phone]  Daughter Melonie - [phone]

## 2015-07-21 DIAGNOSIS — Z6822 Body mass index (BMI) 22.0-22.9, adult: Secondary | ICD-10-CM | POA: Diagnosis not present

## 2015-07-21 DIAGNOSIS — M1611 Unilateral primary osteoarthritis, right hip: Secondary | ICD-10-CM | POA: Diagnosis not present

## 2015-07-21 DIAGNOSIS — E784 Other hyperlipidemia: Secondary | ICD-10-CM | POA: Diagnosis not present

## 2015-07-21 DIAGNOSIS — R809 Proteinuria, unspecified: Secondary | ICD-10-CM | POA: Diagnosis not present

## 2015-07-21 DIAGNOSIS — E038 Other specified hypothyroidism: Secondary | ICD-10-CM | POA: Diagnosis not present

## 2015-07-21 DIAGNOSIS — M5416 Radiculopathy, lumbar region: Secondary | ICD-10-CM | POA: Diagnosis not present

## 2015-07-21 DIAGNOSIS — R739 Hyperglycemia, unspecified: Secondary | ICD-10-CM | POA: Diagnosis not present

## 2015-07-21 DIAGNOSIS — Z125 Encounter for screening for malignant neoplasm of prostate: Secondary | ICD-10-CM | POA: Diagnosis not present

## 2015-07-21 DIAGNOSIS — M25551 Pain in right hip: Secondary | ICD-10-CM | POA: Diagnosis not present

## 2015-07-21 DIAGNOSIS — I10 Essential (primary) hypertension: Secondary | ICD-10-CM | POA: Diagnosis not present

## 2015-07-24 NOTE — Telephone Encounter (Signed)
Spoke with Faith, advised message. She understood and will let the pt know. I advised Dr. Merla Richesoolittle is retiring and he will have to establish with another doctor if he wants to come here.

## 2015-07-26 ENCOUNTER — Ambulatory Visit (INDEPENDENT_AMBULATORY_CARE_PROVIDER_SITE_OTHER): Payer: Medicare Other | Admitting: Family Medicine

## 2015-07-26 ENCOUNTER — Ambulatory Visit (INDEPENDENT_AMBULATORY_CARE_PROVIDER_SITE_OTHER): Payer: Medicare Other

## 2015-07-26 VITALS — BP 110/68 | HR 67 | Temp 97.3°F | Resp 18 | Ht 66.0 in | Wt 148.0 lb

## 2015-07-26 DIAGNOSIS — M4806 Spinal stenosis, lumbar region: Secondary | ICD-10-CM | POA: Diagnosis not present

## 2015-07-26 DIAGNOSIS — M25551 Pain in right hip: Secondary | ICD-10-CM

## 2015-07-26 DIAGNOSIS — M48061 Spinal stenosis, lumbar region without neurogenic claudication: Secondary | ICD-10-CM

## 2015-07-26 DIAGNOSIS — I2510 Atherosclerotic heart disease of native coronary artery without angina pectoris: Secondary | ICD-10-CM | POA: Diagnosis not present

## 2015-07-26 DIAGNOSIS — M5136 Other intervertebral disc degeneration, lumbar region: Secondary | ICD-10-CM | POA: Diagnosis not present

## 2015-07-26 DIAGNOSIS — K4090 Unilateral inguinal hernia, without obstruction or gangrene, not specified as recurrent: Secondary | ICD-10-CM

## 2015-07-26 DIAGNOSIS — M51369 Other intervertebral disc degeneration, lumbar region without mention of lumbar back pain or lower extremity pain: Secondary | ICD-10-CM

## 2015-07-26 DIAGNOSIS — N289 Disorder of kidney and ureter, unspecified: Secondary | ICD-10-CM

## 2015-07-26 MED ORDER — HYDROCODONE-ACETAMINOPHEN 5-325 MG PO TABS: 1.0000 | ORAL_TABLET | Freq: Four times a day (QID) | ORAL | Status: DC | PRN

## 2015-07-26 NOTE — Patient Instructions (Addendum)
IF you received an x-ray today, you will receive an invoice from [facility]. Please contact [facility] at [phone] with questions or concerns regarding your invoice.   IF you received labwork today, you will receive an invoice from [facility]. Please contact [facility] at [phone] with questions or concerns regarding your invoice.   Our billing staff will not be able to assist you with questions regarding bills from these companies.  You will be contacted with the lab results as soon as they are available. The fastest way to get your results is to activate your My Chart account. Instructions are located on the last page of this paperwork. If you have not heard from us regarding the results in 2 weeks, please contact this office.     pPiriformis Syndrome With Rehab Piriformis syndrome is a condition the affects the nervous system in the area of the hip, and is characterized by pain and possibly a loss of feeling in the backside (posterior) thigh that may extend down the entire length of the leg. The symptoms are caused by an increase in pressure on the sciatic nerve by the piriformis muscle, which is on the back of the hip and is responsible for externally rotating the hip. The sciatic nerve and its branches connect to much of the leg. Normally the sciatic nerve runs between the piriformis muscle and other muscles. However, in certain individuals the nerve runs through the muscle, which causes an increase in pressure on the nerve and results in the symptoms of piriformis syndrome. SYMPTOMS   Pain, tingling, numbness, or burning in the back of the thigh that may also extend down the entire leg.  Occasionally, tenderness in the buttock.  Loss of function of the leg.  Pain that worsens when using the piriformis muscle (running, jumping, or stairs).  Pain that increases with prolonged sitting.  Pain that is lessened by lying flat on the  back. CAUSES   Piriformis syndrome is the result of an increase in pressure placed on the sciatic nerve. Oftentimes, piriformis syndrome is an overuse injury.  Stress placed on the nerve from a sudden increase in the intensity, frequency, or duration of training.  Compensation of other extremity injuries. RISK INCREASES WITH:  Sports that involve the piriformis muscle (running, walking, or jumping).  You are born with (congenital) a defect in which the sciatic nerve passes through the muscle. PREVENTION  Warm up and stretch properly before activity.  Allow for adequate recovery between workouts.  Maintain physical fitness:  Strength, flexibility, and endurance.  Cardiovascular fitness. PROGNOSIS  If treated properly, the symptoms of piriformis syndrome usually resolve in 2 to 6 weeks. RELATED COMPLICATIONS   Persistent and possibly permanent pain and numbness in the lower extremity.  Weakness of the extremity that may progress to disability and inability to compete. TREATMENT  The most effective treatment for piriformis syndrome is rest from any activities that aggravate the symptoms. Ice and pain medication may help reduce pain and inflammation. The use of strengthening and stretching exercises may help reduce pain with activity. These exercises may be performed at home or with a therapist. A referral to a therapist may be given for further evaluation and treatment, such as ultrasound. Corticosteroid injections may be given to reduce inflammation that is causing pressure to be placed on the sciatic nerve. If nonsurgical (conservative) treatment is unsuccessful, then surgery may be recommended.  MEDICATION   If pain medication is necessary, then nonsteroidal anti-inflammatory medications,  such as aspirin and ibuprofen, or other minor pain relievers, such as acetaminophen, are often recommended.  Do not take pain medication for 7 days before surgery.  Prescription pain relievers  may be given if deemed necessary by your caregiver. Use only as directed and only as much as you need.  Corticosteroid injections may be given by your caregiver. These injections should be reserved for the most serious cases, because they may only be given a certain number of times. HEAT AND COLD:   Cold treatment (icing) relieves pain and reduces inflammation. Cold treatment should be applied for 10 to 15 minutes every 2 to 3 hours for inflammation and pain and immediately after any activity that aggravates your symptoms. Use ice packs or massage the area with a piece of ice (ice massage).  Heat treatment may be used prior to performing the stretching and strengthening activities prescribed by your caregiver, physical therapist, or athletic trainer. Use a heat pack or soak the injury in warm water. SEEK IMMEDIATE MEDICAL CARE IF:  Treatment seems to offer no benefit, or the condition worsens.  Any medications produce adverse side effects. EXERCISES RANGE OF MOTION (ROM) AND STRETCHING EXERCISES - Piriformis Syndrome These exercises may help you when beginning to rehabilitate your injury. Your symptoms may resolve with or without further involvement from your physician, physical therapist, or athletic trainer. While completing these exercises, remember:   Restoring tissue flexibility helps normal motion to return to the joints. This allows healthier, less painful movement and activity.  An effective stretch should be held for at least 30 seconds.  A stretch should never be painful. You should only feel a gentle lengthening or release in the stretched tissue. STRETCH - Hip Rotators  Lie on your back on a firm surface. Grasp your right / left knee with your right / left hand and your ankle with your opposite hand.  Keeping your hips and shoulders firmly planted, gently pull your right / left knee and rotate your lower leg toward your opposite shoulder until you feel a stretch in your  buttocks.  Hold this stretch for __________ seconds. Repeat this stretch __________ times. Complete this stretch __________ times per day. STRETCH - Iliotibial Band  On the floor or bed, lie on your side so your right / left leg is on top. Bend your knee and grab your ankle.  Slowly bring your knee back so that your thigh is in line with your trunk. Keep your heel at your buttocks and gently arch your back so your head, shoulders, and hips line up.  Slowly lower your leg so that your knee approaches the floor/bed until you feel a gentle stretch on the outside of your right / left thigh. If you do not feel a stretch and your knee will not fall farther, place the heel of your opposite foot on top of your knee and pull your thigh down farther.  Hold this stretch for __________ seconds. Repeat __________ times. Complete __________ times per day. STRENGTHENING EXERCISES - Piriformis Syndrome  These are some of the caregiver again or until your symptoms are resolved. Remember:   Strong muscles with good endurance tolerate stress better.  Do the exercises as initially prescribed by your caregiver. Progress slowly with each exercise, gradually increasing the number of repetitions and weight used under their guidance. STRENGTH - Hip Abductors, Straight Leg Raises Be aware of your form throughout the entire exercise so that you exercise the correct muscles. Sloppy form means that you are  not strengthening the correct muscles.  Lie on your side so that your head, shoulders, knee, and hip line up. You may bend your lower knee to help maintain your balance. Your right / left leg should be on top.  Roll your hips slightly forward, so that your hips are stacked directly over each other and your right / left knee is facing forward.  Lift your top leg up 4-6 inches, leading with your heel. Be sure that your foot does not drift forward or that your knee does not roll toward the ceiling.  Hold this  position for __________ seconds. You should feel the muscles in your outer hip lifting (you may not notice this until your leg begins to tire).  Slowly lower your leg to the starting position. Allow the muscles to fully relax before beginning the next repetition. Repeat __________ times. Complete this exercise __________ times per day.  STRENGTH - Hip Abductors, Quadruped  On a firm, lightly padded surface, position yourself on your hands and knees. Your hands should be directly below your shoulders and your knees should be directly below your hips.  Keeping your right / left knee bent, lift your leg out to the side. Keep your legs level and in line with your shoulders.  Position yourself on your hands and knees.  Hold for __________ seconds.  Keeping your trunk steady and your hips level, slowly lower your leg to the starting position. Repeat __________ times. Complete this exercise __________ times per day.  STRENGTH - Hip Abductors, Standing  Tie one end of a rubber exercise band/tubing to a secure surface (table, pole) and tie a loop at the other end.  Place the loop around your right / left ankle. Keeping your ankle with the band directly opposite of the secured end, step away until there is tension in the tube/band.  Hold onto a chair as needed for balance.  Keeping your back upright, your shoulders over your hips, and your toes pointing forward, lift your right / left leg out to your side. Be sure to lift your leg with your hip muscles. Do not "throw" your leg or tip your body to lift your leg.  Slowly and with control, return to the starting position. Repeat exercise __________ times. Complete this exercise __________ times per day.    This information is not intended to replace advice given to you by your health care provider. Make sure you discuss any questions you have with your health care provider.   Document Released: 01/07/2005 Document Revised: 05/24/2014 Document  Reviewed: 04/21/2008 Elsevier Interactive Patient Education [employer].

## 2015-07-26 NOTE — Progress Notes (Signed)
Subjective:    Patient ID: Shawn Sanchez, male    DOB: 1931-11-30, 82 y.o.   MRN: [id number]  07/26/2015  Back Pain (Radiates down right leg)   HPI This 82 y.o. male presents for evaluation of R leg pain.  Onset years ago.  Came here three weeks ago for hernia; never treated in past; had discussed 1-2 years ago with a physician assistant; did not recommend further treatment.  Diagnosed with hernia by Dr. Merla Richesoolittle.  Pain in lower back and R hip for couple of years.  Intermittent in the past and now more constant.  Diagnosed as spinal stenosis in the past.  S/p xray yesterday by primary care physician; ordered by Dr. Timothy Lassousso.   Just handed xray to patient yesterday; forgot to bring xray today.  Has an annual CPE next week with Dr. Timothy Lassousso.  Had to reschedule CPE with Timothy Lassousso.  Primary care office prescribed Vicodin, cyclobenzaprine.   Vicodin is not helping much.  Friends with April Barnes.  Not happy with care receiving at Dr. Ferd Hibbsusso's office; longstanding patient.  Here primarily for pain.  Wants guidance regarding hernia surgery versus lower back issues.  Ready to do something about lower back /R hip pain.  S/p evaluation by Dr. Darrelyn HillockGioffre in 2014; [facility].  Really painful to get out of bed.  Taking two hours to get out of bed.As soon as R leg hits the floor every morning, R hip pain starts. Sleeping well.  Walking with cane is very painful. Crossing legs without pain; pain radiating into R lateral thigh.  R hip xrayed yesterday.  When trying to walk causes pain.  Taking Acetaminophen.  Rx fro Prednisone as well.  Has not contacted any NS regarding R hip/back pain.  Did not pay attention to any of the NS.  Information was on discharge paperwork.  Dr. Merla Richesoolittle recommended Dr. Marikay Alaravid Jones of NS. Daughter just got married in May; daughter living in [place].  Has a caregiver who recommended evaluation.  Had carpal tunnel in B hands; now recurring.  No nighttime awakening with numbness in  hands.  No saddle paresthesias.  Normal b/b function.  Hydrocodone is not working.  Cyclobenzaprine 1/2 as needed.  Not remembering to take medication.  Does not eat right/well.  Eats well in morning with bowl of cereal with fruit.  Eats a lot of TV dinners.  Caregiver has not helped with cooking.  Caregiver forced self onto turf.   Alone since 2004.    R inguinal hernia:  Had a bad hernia attack while at convention in [place].  Hernia really protruding.     Review of Systems  Constitutional: Negative for fever, chills, diaphoresis, activity change, appetite change and fatigue.  Respiratory: Negative for cough and shortness of breath.   Cardiovascular: Negative for chest pain, palpitations and leg swelling.  Gastrointestinal: Negative for abdominal distention, abdominal pain, anal bleeding, blood in stool, constipation, diarrhea, nausea, rectal pain and vomiting.  Endocrine: Negative for cold intolerance, heat intolerance, polydipsia, polyphagia and polyuria.  Genitourinary: Negative for decreased urine volume and difficulty urinating.  Musculoskeletal: Positive for arthralgias, gait problem and myalgias.  Skin: Negative for color change, rash and wound.  Neurological: Negative for dizziness, tremors, seizures, syncope, facial asymmetry, speech difficulty, weakness, light-headedness, numbness and headaches.  Psychiatric/Behavioral: Negative for sleep disturbance and dysphoric mood. The patient is not nervous/anxious.     Past Medical History:  Diagnosis Date  . Anemia, unspecified   . Carpal tunnel syndrome   .  Coronary atherosclerosis of unspecified type of vessel, native or graft   . Esophageal reflux   . Hemorrhage of rectum and anus   . Hyperchylomicronemia   . Unspecified disorder of kidney and ureter   . Unspecified essential hypertension    Past Surgical History:  Procedure Laterality Date  . angioplasy/stent  2006  . lamenectomy  1965   Allergies  Allergen Reactions  .  Novocain [Procaine] Other (See Comments)    With epi---chest pain    Social History   Social History  . Marital status: Married    Spouse name: N/A  . Number of children: N/A  . Years of education: N/A   Occupational History  . Not on file.   Social History Main Topics  . Smoking status: Former Smoker    Types: Pipe, Cigars    Quit date: 10/10/1999  . Smokeless tobacco: Never Used     Comment: cigars only in 2005  . Alcohol use Yes     Comment: Scotch. Stopped drinking 2-3 days ago.   . Drug use: No  . Sexual activity: Not on file   Other Topics Concern  . Not on file   Social History Narrative   ** Merged History Encounter **       Retired; divorced. Daily Caffeine use- 6-7 cups. Pt does not get regular exercise.    Family History  Problem Relation Age of Onset  . Breast cancer Mother   . Lung cancer Mother   . Diabetes Sister   . Lung cancer Father   . Coronary artery disease Father   . Hypertension Father   . Colon cancer Neg Hx        Objective:    BP 110/68   Pulse 67   Temp 97.3 F (36.3 C) (Oral)   Resp 18   Ht 5\' 6"  (1.676 m)   Wt 148 lb (67.1 kg)   SpO2 98%   BMI 23.89 kg/m  Physical Exam  Constitutional: He is oriented to person, place, and time. He appears well-developed and well-nourished. No distress.  HENT:  Head: Normocephalic and atraumatic.  Right Ear: External ear normal.  Left Ear: External ear normal.  Nose: Nose normal.  Mouth/Throat: Oropharynx is clear and moist.  Eyes: Conjunctivae and EOM are normal. Pupils are equal, round, and reactive to light.  Neck: Normal range of motion. Neck supple. Carotid bruit is not present. No thyromegaly present.  Cardiovascular: Normal rate, regular rhythm, normal heart sounds and intact distal pulses.  Exam reveals no gallop and no friction rub.   No murmur heard. Pulmonary/Chest: Effort normal and breath sounds normal. He has no wheezes. He has no rales.  Abdominal: Soft. Bowel sounds are  normal. He exhibits no distension and no mass. There is no tenderness. There is no rebound and no guarding. A hernia is present. Hernia confirmed positive in the right inguinal area.  Musculoskeletal:       Right hip: He exhibits decreased range of motion, decreased strength, tenderness and bony tenderness. He exhibits no swelling, no crepitus, no deformity and no laceration.       Lumbar back: He exhibits decreased range of motion. He exhibits no tenderness, no bony tenderness, no pain, no spasm and normal pulse.  Lymphadenopathy:    He has no cervical adenopathy.  Neurological: He is alert and oriented to person, place, and time. No cranial nerve deficit.  Skin: Skin is warm and dry. No rash noted. He is not diaphoretic.  Psychiatric:  He has a normal mood and affect. His behavior is normal.  Nursing note and vitals reviewed.        Assessment & Plan:   1. Right hip pain   2. Spinal stenosis of lumbar region   3. DDD (degenerative disc disease), lumbar   4. Right inguinal hernia   5. Disorder of kidney and ureter   6. Atherosclerosis of native coronary artery of native heart without angina pectoris    -musculoskeletal symptoms impacting quality of life significantly; thus, recommend undergoing orthopedic evaluation for symptoms now; recommend deferring evaluation for R inguinal hernia repair. -refer to ortho. -rx for hydrocodone provided yet cautioned pt the risk of sedation on medication.   Orders Placed This Encounter  Procedures  . DG Lumbar Spine Complete    Standing Status:   Future    Number of Occurrences:   1    Standing Expiration Date:   07/25/2016    Order Specific Question:   Reason for Exam (SYMPTOM  OR DIAGNOSIS REQUIRED)    Answer:   lower back pain for years with radiation into R leg.    Order Specific Question:   Preferred imaging location?    Answer:   External  . Ambulatory referral to Orthopedic Surgery    Referral Priority:   Routine    Referral Type:    Surgical    Referral Reason:   Specialty Services Required    Requested Specialty:   Orthopedic Surgery    Number of Visits Requested:   1   Meds ordered this encounter  Medications  . DISCONTD: cyclobenzaprine (FLEXERIL) 5 MG tablet    Sig: Take 5 mg by mouth 3 (three) times daily as needed for muscle spasms.  . predniSONE (DELTASONE) 20 MG tablet    Sig: TK 2 TS PO D    Refill:  0  . DISCONTD: VICODIN 5-300 MG TABS    Sig: TK 1 T PO  Q 8 H PRN P. CAUTION ON SEDATION    Refill:  0  . cyclobenzaprine (FLEXERIL) 10 MG tablet    Sig: TK 1 T PO TID PRN    Refill:  0  . carvedilol (COREG) 3.125 MG tablet    Sig: TK 1 T PO  BID    Refill:  5  . HYDROcodone-acetaminophen (NORCO) 5-325 MG tablet    Sig: Take 1-2 tablets by mouth every 6 (six) hours as needed.    Dispense:  30 tablet    Refill:  0    No Follow-up on file.    Kaja Jackowski Paulita Fujita, M.D. [facility] [address] [place], [place]  [address] [phone] phone [phone] fax

## 2015-07-28 NOTE — Telephone Encounter (Signed)
Shawn Sanchez called to find out the progress of [name],  He is confused regarding his treatment.  She will follow up with Dr. Timothy Lassousso and patient.

## 2015-08-04 DIAGNOSIS — K5909 Other constipation: Secondary | ICD-10-CM | POA: Diagnosis not present

## 2015-08-04 DIAGNOSIS — Z0000 Encounter for general adult medical examination without abnormal findings: Secondary | ICD-10-CM | POA: Diagnosis not present

## 2015-08-04 DIAGNOSIS — R7309 Other abnormal glucose: Secondary | ICD-10-CM | POA: Diagnosis not present

## 2015-08-04 DIAGNOSIS — E038 Other specified hypothyroidism: Secondary | ICD-10-CM | POA: Diagnosis not present

## 2015-08-04 DIAGNOSIS — K4090 Unilateral inguinal hernia, without obstruction or gangrene, not specified as recurrent: Secondary | ICD-10-CM | POA: Diagnosis not present

## 2015-08-04 DIAGNOSIS — M5416 Radiculopathy, lumbar region: Secondary | ICD-10-CM | POA: Diagnosis not present

## 2015-08-04 DIAGNOSIS — I2510 Atherosclerotic heart disease of native coronary artery without angina pectoris: Secondary | ICD-10-CM | POA: Diagnosis not present

## 2015-08-04 DIAGNOSIS — N183 Chronic kidney disease, stage 3 (moderate): Secondary | ICD-10-CM | POA: Diagnosis not present

## 2015-08-04 DIAGNOSIS — Z1389 Encounter for screening for other disorder: Secondary | ICD-10-CM | POA: Diagnosis not present

## 2015-08-04 DIAGNOSIS — R413 Other amnesia: Secondary | ICD-10-CM | POA: Diagnosis not present

## 2015-08-04 DIAGNOSIS — E784 Other hyperlipidemia: Secondary | ICD-10-CM | POA: Diagnosis not present

## 2015-08-04 DIAGNOSIS — Z6823 Body mass index (BMI) 23.0-23.9, adult: Secondary | ICD-10-CM | POA: Diagnosis not present

## 2015-08-05 ENCOUNTER — Other Ambulatory Visit: Payer: Self-pay | Admitting: Internal Medicine

## 2015-08-05 DIAGNOSIS — M545 Low back pain: Secondary | ICD-10-CM

## 2015-08-10 ENCOUNTER — Ambulatory Visit
Admission: RE | Admit: 2015-08-10 | Discharge: 2015-08-10 | Disposition: A | Discharge status: Home / Self Care | Payer: Medicare Other | Source: Ambulatory Visit | Attending: Internal Medicine | Admitting: Internal Medicine

## 2015-08-10 DIAGNOSIS — M5126 Other intervertebral disc displacement, lumbar region: Secondary | ICD-10-CM | POA: Diagnosis not present

## 2015-08-10 DIAGNOSIS — M545 Low back pain: Secondary | ICD-10-CM

## 2015-08-29 ENCOUNTER — Other Ambulatory Visit: Payer: Self-pay | Admitting: Neurological Surgery

## 2015-08-29 DIAGNOSIS — M4806 Spinal stenosis, lumbar region: Secondary | ICD-10-CM | POA: Diagnosis not present

## 2015-09-06 ENCOUNTER — Telehealth: Payer: Self-pay | Admitting: *Deleted

## 2015-09-06 NOTE — Telephone Encounter (Signed)
Pt is needing clearance for L/3-4, L/4-5 lumbar laminectomy. They are also asking for directions regarding the pts plavis. Will forward for dr Jens Somcrenshaw review

## 2015-09-06 NOTE — Telephone Encounter (Signed)
Hold plavix 5-7 d prior to procedure and resume day after Olga MillersBrian Crenshaw

## 2015-09-08 NOTE — Telephone Encounter (Signed)
Will forward this note to the number provided. 

## 2015-09-18 ENCOUNTER — Encounter (HOSPITAL_COMMUNITY): Payer: Self-pay

## 2015-09-18 NOTE — Pre-Procedure Instructions (Signed)
Shawn Sanchez  09/18/2015      [facility] - [place], [place] - [address] [place] [id number] Phone: [phone] Fax: [phone]  [facility] - [place] [address] [facility] [address] [place] [id number] Phone: [phone] Fax: [phone]    Your procedure is scheduled on September 6  [facility] at 0830 A.M.  Call this number if you have problems the morning of surgery:  [phone]   Remember:  Do not eat food or drink liquids after midnight.   Take these medicines the morning of surgery with A SIP OF WATER acetaminophen (tylenol), carvedilol (coreg), levothyroxine (synthroid), omeprazole (prilosec), oxycodone (oxy-ir), tamsulosin (flomax)  7 days prior to surgery STOP taking any Aspirin, Aleve, Naproxen, Ibuprofen, Motrin, Advil, Goody's, BC's, all herbal medications, fish oil, and all vitamins    Do not wear jewelry..  Do not wear lotions, powders, or cologne, or deoderant.  Men may shave face and neck.  Do not bring valuables to the hospital.  [facility] is not responsible for any belongings or valuables.  Contacts, dentures or bridgework may not be worn into surgery.  Leave your suitcase in the car.  After surgery it may be brought to your room.  For patients admitted to the hospital, discharge time will be determined by your treatment team.  Patients discharged the day of surgery will not be allowed to drive home.    Special instructions:   [place]- Preparing For Surgery  Before surgery, you can play an important role. Because skin is not sterile, your skin needs to be as free of germs as possible. You can reduce the number of germs on your skin by washing with CHG (chlorahexidine gluconate) Soap before surgery.  CHG is an antiseptic cleaner which kills germs and bonds with the skin to continue killing germs even after  washing.  Please do not use if you have an allergy to CHG or antibacterial soaps. If your skin becomes reddened/irritated stop using the CHG.  Do not shave (including legs and underarms) for at least 48 hours prior to first CHG shower. It is OK to shave your face.  Please follow these instructions carefully.   1. Shower the NIGHT BEFORE SURGERY and the MORNING OF SURGERY with CHG.   2. If you chose to wash your hair, wash your hair first as usual with your normal shampoo.  3. After you shampoo, rinse your hair and body thoroughly to remove the shampoo.  4. Use CHG as you would any other liquid soap. You can apply CHG directly to the skin and wash gently with a scrungie or a clean washcloth.   5. Apply the CHG Soap to your body ONLY FROM THE NECK DOWN.  Do not use on open wounds or open sores. Avoid contact with your eyes, ears, mouth and genitals (private parts). Wash genitals (private parts) with your normal soap.  6. Wash thoroughly, paying special attention to the area where your surgery will be performed.  7. Thoroughly rinse your body with warm water from the neck down.  8. DO NOT shower/wash with your normal soap after using and rinsing off the CHG Soap.  9. Pat yourself dry with a CLEAN TOWEL.   10. Wear CLEAN PAJAMAS   11. Place CLEAN SHEETS on your bed the night of your first shower and DO NOT SLEEP WITH PETS.  Day of Surgery: Do not apply any deodorants/lotions. Please wear clean clothes to the hospital/surgery center.      Please read over the following fact sheets that you were given. Pain Booklet, Coughing and Deep Breathing, MRSA Information and Surgical Site Infection Prevention

## 2015-09-19 ENCOUNTER — Encounter (HOSPITAL_COMMUNITY): Payer: Self-pay

## 2015-09-19 ENCOUNTER — Encounter (HOSPITAL_COMMUNITY)
Admission: RE | Admit: 2015-09-19 | Discharge: 2015-09-19 | Disposition: A | Discharge status: Home / Self Care | Payer: Medicare Other | Source: Ambulatory Visit | Attending: Neurological Surgery | Admitting: Neurological Surgery

## 2015-09-19 ENCOUNTER — Ambulatory Visit (HOSPITAL_COMMUNITY)
Admission: RE | Admit: 2015-09-19 | Discharge: 2015-09-19 | Disposition: A | Discharge status: Home / Self Care | Payer: Medicare Other | Source: Ambulatory Visit | Attending: Neurological Surgery | Admitting: Neurological Surgery

## 2015-09-19 DIAGNOSIS — Z955 Presence of coronary angioplasty implant and graft: Secondary | ICD-10-CM | POA: Insufficient documentation

## 2015-09-19 DIAGNOSIS — Z01812 Encounter for preprocedural laboratory examination: Secondary | ICD-10-CM | POA: Diagnosis not present

## 2015-09-19 DIAGNOSIS — I2510 Atherosclerotic heart disease of native coronary artery without angina pectoris: Secondary | ICD-10-CM | POA: Insufficient documentation

## 2015-09-19 DIAGNOSIS — E783 Hyperchylomicronemia: Secondary | ICD-10-CM | POA: Insufficient documentation

## 2015-09-19 DIAGNOSIS — Z01818 Encounter for other preprocedural examination: Secondary | ICD-10-CM | POA: Diagnosis not present

## 2015-09-19 DIAGNOSIS — Z7902 Long term (current) use of antithrombotics/antiplatelets: Secondary | ICD-10-CM | POA: Diagnosis not present

## 2015-09-19 DIAGNOSIS — E039 Hypothyroidism, unspecified: Secondary | ICD-10-CM | POA: Diagnosis not present

## 2015-09-19 DIAGNOSIS — R918 Other nonspecific abnormal finding of lung field: Secondary | ICD-10-CM | POA: Insufficient documentation

## 2015-09-19 DIAGNOSIS — I252 Old myocardial infarction: Secondary | ICD-10-CM | POA: Insufficient documentation

## 2015-09-19 DIAGNOSIS — J984 Other disorders of lung: Secondary | ICD-10-CM | POA: Diagnosis not present

## 2015-09-19 DIAGNOSIS — K219 Gastro-esophageal reflux disease without esophagitis: Secondary | ICD-10-CM | POA: Insufficient documentation

## 2015-09-19 DIAGNOSIS — Z87891 Personal history of nicotine dependence: Secondary | ICD-10-CM | POA: Insufficient documentation

## 2015-09-19 DIAGNOSIS — Z79899 Other long term (current) drug therapy: Secondary | ICD-10-CM | POA: Diagnosis not present

## 2015-09-19 DIAGNOSIS — Z8673 Personal history of transient ischemic attack (TIA), and cerebral infarction without residual deficits: Secondary | ICD-10-CM | POA: Insufficient documentation

## 2015-09-19 DIAGNOSIS — I10 Essential (primary) hypertension: Secondary | ICD-10-CM | POA: Diagnosis not present

## 2015-09-19 DIAGNOSIS — M4806 Spinal stenosis, lumbar region: Secondary | ICD-10-CM | POA: Insufficient documentation

## 2015-09-19 DIAGNOSIS — M48061 Spinal stenosis, lumbar region without neurogenic claudication: Secondary | ICD-10-CM

## 2015-09-19 HISTORY — DX: Depression, unspecified: F32.A

## 2015-09-19 HISTORY — DX: Transient cerebral ischemic attack, unspecified: G45.9

## 2015-09-19 HISTORY — DX: Reserved for inherently not codable concepts without codable children: IMO0001

## 2015-09-19 HISTORY — DX: Unilateral inguinal hernia, without obstruction or gangrene, not specified as recurrent: K40.90

## 2015-09-19 HISTORY — DX: Personal history of other diseases of the respiratory system: Z87.09

## 2015-09-19 HISTORY — DX: Major depressive disorder, single episode, unspecified: F32.9

## 2015-09-19 HISTORY — DX: Hypothyroidism, unspecified: E03.9

## 2015-09-19 HISTORY — DX: Unspecified osteoarthritis, unspecified site: M19.90

## 2015-09-19 LAB — COMPREHENSIVE METABOLIC PANEL
ALBUMIN: 3.8 g/dL (ref 3.5–5.0)
ALK PHOS: 57 U/L (ref 38–126)
ALT: 19 U/L (ref 17–63)
ANION GAP: 5 (ref 5–15)
AST: 20 U/L (ref 15–41)
BILIRUBIN TOTAL: 0.7 mg/dL (ref 0.3–1.2)
BUN: 16 mg/dL (ref 6–20)
CALCIUM: 9.3 mg/dL (ref 8.9–10.3)
CO2: 28 mmol/L (ref 22–32)
Chloride: 105 mmol/L (ref 101–111)
Creatinine, Ser: 1.17 mg/dL (ref 0.61–1.24)
GFR calc Af Amer: >60 mL/min (ref >60)
GFR, EST NON AFRICAN AMERICAN: 56 mL/min — AB (ref >60)
GLUCOSE: 127 mg/dL — AB (ref 65–99)
Potassium: 4.1 mmol/L (ref 3.5–5.1)
Sodium: 138 mmol/L (ref 135–145)
TOTAL PROTEIN: 6 g/dL — AB (ref 6.5–8.1)

## 2015-09-19 LAB — CBC WITH DIFFERENTIAL/PLATELET
Basophils Absolute: 0 10*3/uL (ref 0.0–0.1)
Basophils Relative: 0 %
EOS ABS: 0.3 10*3/uL (ref 0.0–0.7)
EOS PCT: 5 %
HCT: 34.8 % — ABNORMAL LOW (ref 39.0–52.0)
Hemoglobin: 11.3 g/dL — ABNORMAL LOW (ref 13.0–17.0)
LYMPHS ABS: 1.8 10*3/uL (ref 0.7–4.0)
LYMPHS PCT: 27 %
MCH: 33.1 pg (ref 26.0–34.0)
MCHC: 32.5 g/dL (ref 30.0–36.0)
MCV: 102.1 fL — AB (ref 78.0–100.0)
MONO ABS: 0.7 10*3/uL (ref 0.1–1.0)
MONOS PCT: 10 %
Neutro Abs: 3.7 10*3/uL (ref 1.7–7.7)
Neutrophils Relative %: 58 %
PLATELETS: 169 10*3/uL (ref 150–400)
RBC: 3.41 MIL/uL — AB (ref 4.22–5.81)
RDW: 12.7 % (ref 11.5–15.5)
WBC: 6.5 10*3/uL (ref 4.0–10.5)

## 2015-09-19 LAB — PROTIME-INR
INR: 1.07
Prothrombin Time: 14 seconds (ref 11.4–15.2)

## 2015-09-19 LAB — SURGICAL PCR SCREEN
MRSA, PCR: NEGATIVE
Staphylococcus aureus: NEGATIVE

## 2015-09-19 NOTE — Progress Notes (Addendum)
PCP - Creola CornJohn Russo Cardiologist - Crenshaw  Chest x-ray - 09/19/15  EKG - 02/13/15 Stress Test - 08/07/11 ECHO - 09/07/13 Cardiac Cath - 2006 but patient cant remember where   Sleep study with no diagnosis of sleep apnea but could not remember where it was done or who the doctor was  Sending to anesthesia for review   patient is stopping Plavix September 1  Patient denies shortness of breath, fever, cough and chest pain at PAT appointment

## 2015-09-20 NOTE — Progress Notes (Signed)
Anesthesia Chart Review:  Pt is an 82 year old male scheduled for L3-4, L4-5 laminectomy and foraminotomy on 09/27/2015 with Marikay Alaravid Jones, MD.   -  PCP is Creola CornJohn Russo, MD.  - Cardiologist is Olga MillersBrian Crenshaw, MD who is aware of surgery and gave ok to hold plavix.   PMH includes:  CAD (MI with stent to RCA and LAD 2006), HTN, TIA, hypothyroidism, anemia, hyperchylomicronemia, post-op N/V, GERD.  Former smoker. BMI 22.5  Medications include: carvedilol, plavix, levothyroxine, prilosec, simvastatin. Pt to stop plavix 09/22/15.   Preoperative labs reviewed.    CXR 09/19/15:  Bilateral interstitial prominence again noted. Although these changes may be chronic active interstitial process including pneumonitis cannot be completely excluded and clinical correlation suggested. Bilateral pleural-parenchymal scarring.  EKG 02/13/15: Sinus rhythm. Borderline T abnormalities, anterior leads. Baseline wander in lead(s) V2  Echo 09/07/13:  - Left ventricle: The cavity size was normal. Wall thickness was normal. The estimated ejection fraction was 55%. Wall motion was normal; there were no regional wall motion abnormalities. Doppler parameters are consistent with abnormal left ventricular relaxation (grade 1 diastolic dysfunction). - Aortic valve: There was no stenosis. - Mitral valve: Mildly to moderately calcified annulus. Mildly calcified leaflets . There was no significant regurgitation. - Right ventricle: The cavity size was normal. Systolic function was normal. - Tricuspid valve: Peak RV-RA gradient (S): 13 mm Hg. - Pulmonary arteries: PA peak pressure: 16 mm Hg (S). - Inferior vena cava: The vessel was normal in size. The respirophasic diameter changes were in the normal range (= 50%), consistent with normal central venous pressure.  Carotid duplex 09/06/13: No significant carotid plaque or stenosis.  Nuclear stress test 08/07/11: Normal stress nuclear study. LV Ejection Fraction: 67%.  LV Wall Motion:  NL LV  Function; NL Wall Motion  Cardiac cath 12/22/07:  1. Nonobstructive coronary artery disease (40-50% LAD, 50-60% ramus, 40% proximal RCA, 30% mid RCA). 2. Patent right coronary artery and left anterior descending stents. 3. Normal left ventricular function.  If no changes, I anticipate pt can proceed with surgery as scheduled.   Rica Mastngela Jalyne Brodzinski, FNP-BC [facility] Phone: [phone] 09/20/2015 2:15 PM

## 2015-09-26 MED ORDER — CEFAZOLIN SODIUM-DEXTROSE 2-4 GM/100ML-% IV SOLN
2.0000 g | INTRAVENOUS | Status: AC
  Administered 2015-09-27: 2 g via INTRAVENOUS
  Filled 2015-09-26: qty 100

## 2015-09-26 MED ORDER — DEXAMETHASONE SODIUM PHOSPHATE 10 MG/ML IJ SOLN
10.0000 mg | INTRAMUSCULAR | Status: AC
  Administered 2015-09-27 (×2): 10 mg via INTRAVENOUS
  Filled 2015-09-26: qty 1

## 2015-09-27 ENCOUNTER — Inpatient Hospital Stay (HOSPITAL_COMMUNITY): Payer: Medicare Other

## 2015-09-27 ENCOUNTER — Encounter (HOSPITAL_COMMUNITY)
Admission: RE | Disposition: A | Discharge status: Home / Self Care | Payer: Self-pay | Source: Ambulatory Visit | Attending: Neurological Surgery

## 2015-09-27 ENCOUNTER — Ambulatory Visit (HOSPITAL_COMMUNITY)
Admission: RE | Admit: 2015-09-27 | Discharge: 2015-09-28 | Disposition: A | Discharge status: Home / Self Care | Payer: Medicare Other | Source: Ambulatory Visit | Attending: Neurological Surgery | Admitting: Neurological Surgery

## 2015-09-27 ENCOUNTER — Inpatient Hospital Stay (HOSPITAL_COMMUNITY): Payer: Medicare Other | Admitting: Anesthesiology

## 2015-09-27 ENCOUNTER — Inpatient Hospital Stay (HOSPITAL_COMMUNITY): Payer: Medicare Other | Admitting: Emergency Medicine

## 2015-09-27 ENCOUNTER — Encounter (HOSPITAL_COMMUNITY): Payer: Self-pay | Admitting: Neurological Surgery

## 2015-09-27 DIAGNOSIS — Z87891 Personal history of nicotine dependence: Secondary | ICD-10-CM | POA: Diagnosis not present

## 2015-09-27 DIAGNOSIS — E039 Hypothyroidism, unspecified: Secondary | ICD-10-CM | POA: Diagnosis not present

## 2015-09-27 DIAGNOSIS — Z79899 Other long term (current) drug therapy: Secondary | ICD-10-CM | POA: Diagnosis not present

## 2015-09-27 DIAGNOSIS — M4806 Spinal stenosis, lumbar region: Secondary | ICD-10-CM | POA: Diagnosis not present

## 2015-09-27 DIAGNOSIS — I2510 Atherosclerotic heart disease of native coronary artery without angina pectoris: Secondary | ICD-10-CM | POA: Insufficient documentation

## 2015-09-27 DIAGNOSIS — Z955 Presence of coronary angioplasty implant and graft: Secondary | ICD-10-CM | POA: Diagnosis not present

## 2015-09-27 DIAGNOSIS — Z8673 Personal history of transient ischemic attack (TIA), and cerebral infarction without residual deficits: Secondary | ICD-10-CM | POA: Diagnosis not present

## 2015-09-27 DIAGNOSIS — Z9889 Other specified postprocedural states: Secondary | ICD-10-CM

## 2015-09-27 DIAGNOSIS — I10 Essential (primary) hypertension: Secondary | ICD-10-CM | POA: Diagnosis not present

## 2015-09-27 DIAGNOSIS — K219 Gastro-esophageal reflux disease without esophagitis: Secondary | ICD-10-CM | POA: Insufficient documentation

## 2015-09-27 DIAGNOSIS — Z981 Arthrodesis status: Secondary | ICD-10-CM | POA: Diagnosis not present

## 2015-09-27 DIAGNOSIS — Z7902 Long term (current) use of antithrombotics/antiplatelets: Secondary | ICD-10-CM | POA: Insufficient documentation

## 2015-09-27 DIAGNOSIS — Z419 Encounter for procedure for purposes other than remedying health state, unspecified: Secondary | ICD-10-CM

## 2015-09-27 HISTORY — PX: LUMBAR LAMINECTOMY/DECOMPRESSION MICRODISCECTOMY: SHX5026

## 2015-09-27 SURGERY — LUMBAR LAMINECTOMY/DECOMPRESSION MICRODISCECTOMY 2 LEVELS
Anesthesia: General | Site: Back

## 2015-09-27 MED ORDER — SODIUM CHLORIDE 0.9% FLUSH: 3.0000 mL | INTRAVENOUS | Status: DC | PRN

## 2015-09-27 MED ORDER — HYDROMORPHONE HCL 1 MG/ML IJ SOLN
0.2500 mg | INTRAMUSCULAR | Status: DC | PRN
  Administered 2015-09-27: 0.5 mg via INTRAVENOUS

## 2015-09-27 MED ORDER — HEMOSTATIC AGENTS (NO CHARGE) OPTIME
TOPICAL | Status: DC | PRN
  Administered 2015-09-27: 1 via TOPICAL

## 2015-09-27 MED ORDER — THROMBIN 5000 UNITS EX SOLR
CUTANEOUS | Status: DC | PRN
  Administered 2015-09-27 (×2): 5000 [IU] via TOPICAL

## 2015-09-27 MED ORDER — VANCOMYCIN HCL 1000 MG IV SOLR
INTRAVENOUS | Status: AC
  Filled 2015-09-27: qty 1000

## 2015-09-27 MED ORDER — PHENOL 1.4 % MT LIQD: 1.0000 | OROMUCOSAL | Status: DC | PRN

## 2015-09-27 MED ORDER — CARVEDILOL 3.125 MG PO TABS
3.1250 mg | ORAL_TABLET | Freq: Two times a day (BID) | ORAL | Status: DC
  Administered 2015-09-27 – 2015-09-28 (×2): 3.125 mg via ORAL
  Filled 2015-09-27 (×2): qty 1

## 2015-09-27 MED ORDER — SODIUM CHLORIDE 0.9 % IR SOLN
Status: DC | PRN
  Administered 2015-09-27: 500 mL

## 2015-09-27 MED ORDER — LEVOTHYROXINE SODIUM 100 MCG PO TABS
50.0000 ug | ORAL_TABLET | Freq: Every day | ORAL | Status: DC
  Filled 2015-09-27: qty 1

## 2015-09-27 MED ORDER — FENTANYL CITRATE (PF) 100 MCG/2ML IJ SOLN
INTRAMUSCULAR | Status: AC
  Filled 2015-09-27: qty 2

## 2015-09-27 MED ORDER — LIDOCAINE HCL (CARDIAC) 20 MG/ML IV SOLN
INTRAVENOUS | Status: DC | PRN
  Administered 2015-09-27: 100 mg via INTRAVENOUS

## 2015-09-27 MED ORDER — ARTIFICIAL TEARS OP OINT
TOPICAL_OINTMENT | OPHTHALMIC | Status: DC | PRN
Start: 2015-09-27 — End: 2015-09-27
  Administered 2015-09-27: 1 via OPHTHALMIC

## 2015-09-27 MED ORDER — PROMETHAZINE HCL 25 MG/ML IJ SOLN: 6.2500 mg | INTRAMUSCULAR | Status: DC | PRN

## 2015-09-27 MED ORDER — ONDANSETRON HCL 4 MG/2ML IJ SOLN: 4.0000 mg | INTRAMUSCULAR | Status: DC | PRN

## 2015-09-27 MED ORDER — FENTANYL CITRATE (PF) 100 MCG/2ML IJ SOLN
INTRAMUSCULAR | Status: DC | PRN
  Administered 2015-09-27: 100 ug via INTRAVENOUS

## 2015-09-27 MED ORDER — CHLORHEXIDINE GLUCONATE CLOTH 2 % EX PADS: 6.0000 | MEDICATED_PAD | Freq: Once | CUTANEOUS | Status: DC

## 2015-09-27 MED ORDER — ACETAMINOPHEN 650 MG RE SUPP: 650.0000 mg | RECTAL | Status: DC | PRN

## 2015-09-27 MED ORDER — OXYCODONE HCL 5 MG PO TABS
5.0000 mg | ORAL_TABLET | Freq: Four times a day (QID) | ORAL | Status: DC | PRN
  Administered 2015-09-27 – 2015-09-28 (×3): 5 mg via ORAL
  Filled 2015-09-27 (×4): qty 1

## 2015-09-27 MED ORDER — SODIUM CHLORIDE 0.9% FLUSH
3.0000 mL | Freq: Two times a day (BID) | INTRAVENOUS | Status: DC
  Administered 2015-09-27: 3 mL via INTRAVENOUS

## 2015-09-27 MED ORDER — SODIUM CHLORIDE 0.9 % IV SOLN: 250.0000 mL | INTRAVENOUS | Status: DC

## 2015-09-27 MED ORDER — SUCCINYLCHOLINE CHLORIDE 200 MG/10ML IV SOSY
PREFILLED_SYRINGE | INTRAVENOUS | Status: AC
  Filled 2015-09-27: qty 20

## 2015-09-27 MED ORDER — POTASSIUM CHLORIDE IN NACL 20-0.9 MEQ/L-% IV SOLN
INTRAVENOUS | Status: DC
  Filled 2015-09-27: qty 1000

## 2015-09-27 MED ORDER — MENTHOL 3 MG MT LOZG: 1.0000 | LOZENGE | OROMUCOSAL | Status: DC | PRN

## 2015-09-27 MED ORDER — SUGAMMADEX SODIUM 200 MG/2ML IV SOLN
INTRAVENOUS | Status: DC | PRN
  Administered 2015-09-27: 150 mg via INTRAVENOUS

## 2015-09-27 MED ORDER — GLYCOPYRROLATE 0.2 MG/ML IV SOSY
PREFILLED_SYRINGE | INTRAVENOUS | Status: AC
  Filled 2015-09-27: qty 3

## 2015-09-27 MED ORDER — CEFAZOLIN SODIUM 1 G IJ SOLR
INTRAMUSCULAR | Status: AC
  Filled 2015-09-27: qty 80

## 2015-09-27 MED ORDER — PHENYLEPHRINE HCL 10 MG/ML IJ SOLN
INTRAMUSCULAR | Status: DC | PRN
  Administered 2015-09-27: 40 ug via INTRAVENOUS

## 2015-09-27 MED ORDER — MIDAZOLAM HCL 5 MG/5ML IJ SOLN
INTRAMUSCULAR | Status: DC | PRN
  Administered 2015-09-27: 2 mg via INTRAVENOUS

## 2015-09-27 MED ORDER — 0.9 % SODIUM CHLORIDE (POUR BTL) OPTIME
TOPICAL | Status: DC | PRN
  Administered 2015-09-27: 1000 mL

## 2015-09-27 MED ORDER — LACTATED RINGERS IV SOLN
INTRAVENOUS | Status: DC
  Administered 2015-09-27 (×3): via INTRAVENOUS

## 2015-09-27 MED ORDER — CEFAZOLIN IN D5W 1 GM/50ML IV SOLN
1.0000 g | Freq: Three times a day (TID) | INTRAVENOUS | Status: AC
  Administered 2015-09-27: 1 g via INTRAVENOUS
  Filled 2015-09-27: qty 50

## 2015-09-27 MED ORDER — TAMSULOSIN HCL 0.4 MG PO CAPS
0.4000 mg | ORAL_CAPSULE | Freq: Every day | ORAL | Status: DC
  Administered 2015-09-27 – 2015-09-28 (×2): 0.4 mg via ORAL
  Filled 2015-09-27 (×2): qty 1

## 2015-09-27 MED ORDER — PHENYLEPHRINE 40 MCG/ML (10ML) SYRINGE FOR IV PUSH (FOR BLOOD PRESSURE SUPPORT)
PREFILLED_SYRINGE | INTRAVENOUS | Status: AC
  Filled 2015-09-27: qty 10

## 2015-09-27 MED ORDER — ONDANSETRON HCL 4 MG/2ML IJ SOLN
INTRAMUSCULAR | Status: AC
  Filled 2015-09-27: qty 4

## 2015-09-27 MED ORDER — THROMBIN 5000 UNITS EX SOLR
OROMUCOSAL | Status: DC | PRN
  Administered 2015-09-27: 5 mL via TOPICAL

## 2015-09-27 MED ORDER — LIDOCAINE 2% (20 MG/ML) 5 ML SYRINGE
INTRAMUSCULAR | Status: AC
  Filled 2015-09-27: qty 15

## 2015-09-27 MED ORDER — MORPHINE SULFATE (PF) 2 MG/ML IV SOLN: 1.0000 mg | INTRAVENOUS | Status: DC | PRN

## 2015-09-27 MED ORDER — HYDROMORPHONE HCL 1 MG/ML IJ SOLN
INTRAMUSCULAR | Status: AC
  Filled 2015-09-27: qty 1

## 2015-09-27 MED ORDER — PROPOFOL 10 MG/ML IV BOLUS
INTRAVENOUS | Status: AC
  Filled 2015-09-27: qty 20

## 2015-09-27 MED ORDER — ROCURONIUM BROMIDE 10 MG/ML (PF) SYRINGE
PREFILLED_SYRINGE | INTRAVENOUS | Status: AC
  Filled 2015-09-27: qty 10

## 2015-09-27 MED ORDER — EPHEDRINE SULFATE 50 MG/ML IJ SOLN
INTRAMUSCULAR | Status: DC | PRN
  Administered 2015-09-27: 10 mg via INTRAVENOUS

## 2015-09-27 MED ORDER — MIDAZOLAM HCL 2 MG/2ML IJ SOLN
INTRAMUSCULAR | Status: AC
  Filled 2015-09-27: qty 2

## 2015-09-27 MED ORDER — ROCURONIUM BROMIDE 100 MG/10ML IV SOLN
INTRAVENOUS | Status: DC | PRN
  Administered 2015-09-27: 50 mg via INTRAVENOUS

## 2015-09-27 MED ORDER — PROPOFOL 10 MG/ML IV BOLUS
INTRAVENOUS | Status: DC | PRN
  Administered 2015-09-27: 150 mg via INTRAVENOUS

## 2015-09-27 MED ORDER — ONDANSETRON HCL 4 MG/2ML IJ SOLN
INTRAMUSCULAR | Status: DC | PRN
  Administered 2015-09-27: 4 mg via INTRAVENOUS

## 2015-09-27 MED ORDER — SCOPOLAMINE 1 MG/3DAYS TD PT72: 1.0000 | MEDICATED_PATCH | TRANSDERMAL | Status: DC

## 2015-09-27 MED ORDER — ACETAMINOPHEN 325 MG PO TABS: 650.0000 mg | ORAL_TABLET | ORAL | Status: DC | PRN

## 2015-09-27 SURGICAL SUPPLY — 44 items
APL SKNCLS STERI-STRIP NONHPOA (GAUZE/BANDAGES/DRESSINGS) ×1
BAG DECANTER FOR FLEXI CONT (MISCELLANEOUS) ×3 IMPLANT
BENZOIN TINCTURE PRP APPL 2/3 (GAUZE/BANDAGES/DRESSINGS) ×3 IMPLANT
BUR MATCHSTICK NEURO 3.0 LAGG (BURR) ×3 IMPLANT
CANISTER SUCT 3000ML PPV (MISCELLANEOUS) ×3 IMPLANT
CLOSURE WOUND 1/2 X4 (GAUZE/BANDAGES/DRESSINGS) ×1
DRAPE LAPAROTOMY 100X72X124 (DRAPES) ×3 IMPLANT
DRAPE MICROSCOPE LEICA (MISCELLANEOUS) ×3 IMPLANT
DRAPE POUCH INSTRU U-SHP 10X18 (DRAPES) ×3 IMPLANT
DRAPE SURG 17X23 STRL (DRAPES) ×3 IMPLANT
DRSG OPSITE POSTOP 3X4 (GAUZE/BANDAGES/DRESSINGS) ×2 IMPLANT
DURAPREP 26ML APPLICATOR (WOUND CARE) ×3 IMPLANT
ELECT REM PT RETURN 9FT ADLT (ELECTROSURGICAL) ×3
ELECTRODE REM PT RTRN 9FT ADLT (ELECTROSURGICAL) ×1 IMPLANT
GAUZE SPONGE 4X4 16PLY XRAY LF (GAUZE/BANDAGES/DRESSINGS) IMPLANT
GLOVE BIO SURGEON STRL SZ8 (GLOVE) ×3 IMPLANT
GLOVE BIOGEL M 8.0 STRL (GLOVE) ×2 IMPLANT
GLOVE ECLIPSE 6.5 STRL STRAW (GLOVE) ×2 IMPLANT
GOWN STRL REUS W/ TWL LRG LVL3 (GOWN DISPOSABLE) IMPLANT
GOWN STRL REUS W/ TWL XL LVL3 (GOWN DISPOSABLE) ×1 IMPLANT
GOWN STRL REUS W/TWL 2XL LVL3 (GOWN DISPOSABLE) IMPLANT
GOWN STRL REUS W/TWL LRG LVL3 (GOWN DISPOSABLE) ×3
GOWN STRL REUS W/TWL XL LVL3 (GOWN DISPOSABLE) ×3
HEMOSTAT POWDER KIT SURGIFOAM (HEMOSTASIS) ×2 IMPLANT
KIT BASIN OR (CUSTOM PROCEDURE TRAY) ×3 IMPLANT
KIT ROOM TURNOVER OR (KITS) ×3 IMPLANT
NDL HYPO 25X1 1.5 SAFETY (NEEDLE) ×1 IMPLANT
NDL SPNL 20GX3.5 QUINCKE YW (NEEDLE) IMPLANT
NEEDLE HYPO 25X1 1.5 SAFETY (NEEDLE) ×3 IMPLANT
NEEDLE SPNL 20GX3.5 QUINCKE YW (NEEDLE) IMPLANT
NS IRRIG 1000ML POUR BTL (IV SOLUTION) ×3 IMPLANT
PACK LAMINECTOMY NEURO (CUSTOM PROCEDURE TRAY) ×3 IMPLANT
PAD ARMBOARD 7.5X6 YLW CONV (MISCELLANEOUS) ×9 IMPLANT
PATTIES SURGICAL 1X1 (DISPOSABLE) ×2 IMPLANT
RUBBERBAND STERILE (MISCELLANEOUS) ×2 IMPLANT
SPONGE SURGIFOAM ABS GEL SZ50 (HEMOSTASIS) ×3 IMPLANT
STRIP CLOSURE SKIN 1/2X4 (GAUZE/BANDAGES/DRESSINGS) ×2 IMPLANT
SUT VIC AB 0 CT1 18XCR BRD8 (SUTURE) ×1 IMPLANT
SUT VIC AB 0 CT1 8-18 (SUTURE) ×3
SUT VIC AB 2-0 CP2 18 (SUTURE) ×3 IMPLANT
SUT VIC AB 3-0 SH 8-18 (SUTURE) ×3 IMPLANT
TOWEL OR 17X24 6PK STRL BLUE (TOWEL DISPOSABLE) ×3 IMPLANT
TOWEL OR 17X26 10 PK STRL BLUE (TOWEL DISPOSABLE) ×3 IMPLANT
WATER STERILE IRR 1000ML POUR (IV SOLUTION) ×3 IMPLANT

## 2015-09-27 NOTE — Anesthesia Preprocedure Evaluation (Signed)
Anesthesia Evaluation    Reviewed: Allergy & Precautions, NPO status , Patient's Chart, lab work & pertinent test results  History of Anesthesia Complications (+) PONV and history of anesthetic complications  Airway Mallampati: II  TM Distance: >3 FB Neck ROM: Full    Dental  (+) Teeth Intact, Dental Advisory Given   Pulmonary former smoker,    Pulmonary exam normal        Cardiovascular hypertension, + CAD  Normal cardiovascular exam     Neuro/Psych PSYCHIATRIC DISORDERS Anxiety Depression TIA Neuromuscular disease    GI/Hepatic Neg liver ROS, GERD  ,  Endo/Other  Hypothyroidism   Renal/GU negative Renal ROS     Musculoskeletal   Abdominal   Peds  Hematology negative hematology ROS (+)   Anesthesia Other Findings   Reproductive/Obstetrics                             Anesthesia Physical Anesthesia Plan  ASA: III  Anesthesia Plan: General   Post-op Pain Management:    Induction: Intravenous  Airway Management Planned: Oral ETT  Additional Equipment:   Intra-op Plan:   Post-operative Plan: Extubation in OR  Informed Consent: I have reviewed the patients History and Physical, chart, labs and discussed the procedure including the risks, benefits and alternatives for the proposed anesthesia with the patient or authorized representative who has indicated his/her understanding and acceptance.   Dental advisory given  Plan Discussed with: CRNA and Anesthesiologist  Anesthesia Plan Comments:         Anesthesia Quick Evaluation

## 2015-09-27 NOTE — Anesthesia Procedure Notes (Signed)
Procedure Name: Intubation Date/Time: 09/27/2015 10:42 AM Performed by: Fransisca KaufmannMEYER, Trenisha Lafavor E Pre-anesthesia Checklist: Patient identified, Emergency Drugs available, Suction available and Patient being monitored Patient Re-evaluated:Patient Re-evaluated prior to inductionOxygen Delivery Method: Circle System Utilized Preoxygenation: Pre-oxygenation with 100% oxygen Intubation Type: IV induction Ventilation: Mask ventilation without difficulty Laryngoscope Size: Miller and 3 Grade View: Grade I Tube type: Oral Tube size: 8.0 mm Number of attempts: 1 Airway Equipment and Method: Stylet and Oral airway Placement Confirmation: ETT inserted through vocal cords under direct vision,  positive ETCO2 and breath sounds checked- equal and bilateral Secured at: 23 cm Tube secured with: Tape Dental Injury: Teeth and Oropharynx as per pre-operative assessment

## 2015-09-27 NOTE — Transfer of Care (Signed)
Immediate Anesthesia Transfer of Care Note  Patient: Shawn Sanchez  Procedure(s) Performed: Procedure(s): Laminectomy and Foraminotomy - Lumbar three-four,  Lumbar four-five (N/A)  Patient Location: PACU  Anesthesia Type:General  Level of Consciousness: awake, alert , oriented and sedated  Airway & Oxygen Therapy: Patient Spontanous Breathing and Patient connected to nasal cannula oxygen  Post-op Assessment: Report given to RN, Post -op Vital signs reviewed and stable and Patient moving all extremities  Post vital signs: Reviewed and stable  Last Vitals:  Vitals:   09/27/15 1215  Temp: 36.2 C    Last Pain: There were no vitals filed for this visit.       Complications: No apparent anesthesia complications

## 2015-09-27 NOTE — Anesthesia Postprocedure Evaluation (Signed)
Anesthesia Post Note  Patient: Shawn Sanchez  Procedure(s) Performed: Procedure(s) (LRB): Laminectomy and Foraminotomy - Lumbar three-four,  Lumbar four-five (N/A)  Patient location during evaluation: PACU Anesthesia Type: General Level of consciousness: sedated Pain management: pain level controlled Vital Signs Assessment: post-procedure vital signs reviewed and stable Respiratory status: spontaneous breathing and respiratory function stable Cardiovascular status: stable Anesthetic complications: no    Last Vitals:  Vitals:   09/27/15 1315 09/27/15 1347  BP: 126/76 116/76  Pulse: 69 67  Resp: 13 16  Temp: 36.3 C     Last Pain:  Vitals:   09/27/15 1335  PainSc: 3       LLE Sensation: Full sensation (09/27/15 1335)   RLE Sensation: Full sensation (09/27/15 1335)      Deondre Marinaro DANIEL

## 2015-09-27 NOTE — Op Note (Signed)
09/27/2015  12:19 PM  PATIENT:  Shawn Sanchez  82 y.o. male  PRE-OPERATIVE DIAGNOSIS:  Severe lumbar spinal stenosis L3-4 L4-5 with right leg pain  POST-OPERATIVE DIAGNOSIS:  Same  PROCEDURE:  Decompressive lumbar laminectomy and medial facetectomy foraminotomy L3-4 and L4 -5 on the right with sublaminar decompression  SURGEON:  Marikay Alaravid Akeem Heppler, MD  ASSISTANTS: Dr. Lovell SheehanJenkins  ANESTHESIA:   General  EBL: 50 ml  Total I/O In: 1400 [I.V.:1400] Out: 50 [Blood:50]  BLOOD ADMINISTERED:none  DRAINS: none   SPECIMEN:  No Specimen  INDICATION FOR PROCEDURE: This patient presented with severe right leg pain with ambulation. MRI showed severe spinal stenosis L3-4 and L4-5. I recommended decompressive laminectomy when he failed to respond to medical management. Patient understood the risks, benefits, and alternatives and potential outcomes and wished to proceed.  PROCEDURE DETAILS: The patient was taken to the operating room and after induction of adequate generalized endotracheal anesthesia, the patient was rolled into the prone position on the Wilson frame and all pressure points were padded. The lumbar region was cleaned and then prepped with DuraPrep and draped in the usual sterile fashion. 5 cc of local anesthesia was injected and then a dorsal midline incision was made and carried down to the lumbo sacral fascia. The fascia was opened and the paraspinous musculature was taken down in a subperiosteal fashion to expose L3-4 and L4-5 on the right. Intraoperative x-ray confirmed my level, and then I used a combination of the high-speed drill and the Kerrison punches to perform a hemilaminectomy, medial facetectomy, and foraminotomy at L3-4 and L4-5 on the right. The underlying yellow ligament was opened and removed in a piecemeal fashion to expose the underlying dura and exiting nerve root. The yellow ligament was severely overgrown and was causing severe compression at both levels. This was  removed piecemeal. I undercut the lateral recess and dissected down until I was medial to and distal to the pedicle at both levels. The nerve root was well decompressed.  I then palpated with a coronary dilator along the nerve root and into the foramen to assure adequate decompression at either level. I felt no more compression of the nerve root. I irrigated with saline solution containing bacitracin. Achieved hemostasis with bipolar cautery, lined the dura with Gelfoam, and then closed the fascia with 0 Vicryl. I closed the subcutaneous tissues with 2-0 Vicryl and the subcuticular tissues with 3-0 Vicryl. The skin was then closed with benzoin and Steri-Strips. The drapes were removed, a sterile dressing was applied. The patient was awakened from general anesthesia and transferred to the recovery room in stable condition. At the end of the procedure all sponge, needle and instrument counts were correct.   PLAN OF CARE: Admit for overnight observation  PATIENT DISPOSITION:  PACU - hemodynamically stable.   Delay start of Pharmacological VTE agent (>24hrs) due to surgical blood loss or risk of bleeding:  yes

## 2015-09-27 NOTE — H&P (Signed)
Subjective: Patient is a 82 y.o. male admitted for stenosis. Onset of symptoms was several months ago, gradually worsening since that time.  The pain is rated severe, and is located at the across the lower back and radiates to legs. The pain is described as aching and occurs all day. The symptoms have been progressive. Symptoms are exacerbated by exercise. MRI or CT showed stenosis.   Past Medical History:  Diagnosis Date  . Anemia, unspecified   . Anxiety   . Arthritis   . Carpal tunnel syndrome   . Coronary atherosclerosis of unspecified type of vessel, native or graft   . Depression   . Esophageal reflux   . Hemorrhage of rectum and anus   . Hernia, inguinal   . History of bronchitis   . Hyperchylomicronemia   . Hypothyroidism   . PONV (postoperative nausea and vomiting)   . Shortness of breath dyspnea   . TIA (transient ischemic attack)   . Unspecified disorder of kidney and ureter   . Unspecified essential hypertension     Past Surgical History:  Procedure Laterality Date  . angioplasy/stent  2006  . carpal tunnel release Bilateral   . COLONOSCOPY    . lamenectomy  1965    Prior to Admission medications   Medication Sig Start Date End Date Taking? Authorizing Provider  acetaminophen (TYLENOL) 500 MG tablet Take 1,000 mg by mouth every 6 (six) hours as needed for moderate pain.   Yes Historical Provider, MD  carvedilol (COREG) 3.125 MG tablet Take 3.125 mg by mouth 2 (two) times daily with a meal.   Yes Historical Provider, MD  cholecalciferol (VITAMIN D) 1000 UNITS tablet Take 1,000 Units by mouth daily. Reported on 07/01/2015   Yes Historical Provider, MD  clopidogrel (PLAVIX) 75 MG tablet Take 75 mg by mouth at bedtime.  09/03/13  Yes Historical Provider, MD  levothyroxine (SYNTHROID, LEVOTHROID) 50 MCG tablet Take 50 mcg by mouth daily before breakfast.    Yes Historical Provider, MD  Melatonin 5 MG CAPS Take 2 capsules by mouth at bedtime. Reported on 07/01/2015   Yes  Historical Provider, MD  omeprazole (PRILOSEC) 20 MG capsule Take 20 mg by mouth every other day.  09/25/12  Yes Historical Provider, MD  oxyCODONE (OXY IR/ROXICODONE) 5 MG immediate release tablet Take 5 mg by mouth every 6 (six) hours as needed for severe pain.   Yes Historical Provider, MD  simvastatin (ZOCOR) 20 MG tablet TAKE 1 TABLET BY MOUTH DAILY 06/20/15  Yes Lewayne Bunting, MD  Tamsulosin HCl (FLOMAX) 0.4 MG CAPS Take 0.4 mg by mouth daily.  07/20/11  Yes Historical Provider, MD   Allergies  Allergen Reactions  . Epinephrine Other (See Comments)    WITH NOVOCAIN  CHEST PAIN  . Novocain [Procaine] Other (See Comments)    WITH EPINEPHRINE CHEST PAIN    Social History  Substance Use Topics  . Smoking status: Former Smoker    Types: Pipe, Cigars    Quit date: 10/10/1999  . Smokeless tobacco: Never Used     Comment: cigars only in 2005  . Alcohol use No     Comment: Scotch. Stopped drinking 2-3 days ago.     Family History  Problem Relation Age of Onset  . Breast cancer Mother   . Lung cancer Mother   . Diabetes Sister   . Lung cancer Father   . Coronary artery disease Father   . Hypertension Father   . Colon cancer Neg Hx  Review of Systems  Positive ROS: neg  All other systems have been reviewed and were otherwise negative with the exception of those mentioned in the HPI and as above.  Objective: Vital signs in last 24 hours:    General Appearance: Alert, cooperative, no distress, appears stated age Head: Normocephalic, without obvious abnormality, atraumatic Eyes: PERRL, conjunctiva/corneas clear, EOM's intact    Neck: Supple, symmetrical, trachea midline Back: Symmetric, no curvature, ROM normal, no CVA tenderness Lungs:  respirations unlabored Heart: Regular rate and rhythm Abdomen: Soft, non-tender Extremities: Extremities normal, atraumatic, no cyanosis or edema Pulses: 2+ and symmetric all extremities Skin: Skin color, texture, turgor normal, no  rashes or lesions  NEUROLOGIC:   Mental status: Alert and oriented x4,  no aphasia, good attention span, fund of knowledge, and memory Motor Exam - grossly normal Sensory Exam - grossly normal Reflexes: trace Coordination - grossly normal Gait - grossly normal Balance - grossly normal Cranial Nerves: I: smell Not tested  II: visual acuity  OS: nl    OD: nl  II: visual fields Full to confrontation  II: pupils Equal, round, reactive to light  III,VII: ptosis None  III,IV,VI: extraocular muscles  Full ROM  V: mastication Normal  V: facial light touch sensation  Normal  V,VII: corneal reflex  Present  VII: facial muscle function - upper  Normal  VII: facial muscle function - lower Normal  VIII: hearing Not tested  IX: soft palate elevation  Normal  IX,X: gag reflex Present  XI: trapezius strength  5/5  XI: sternocleidomastoid strength 5/5  XI: neck flexion strength  5/5  XII: tongue strength  Normal    Data Review Lab Results  Component Value Date   WBC 6.5 09/19/2015   HGB 11.3 (L) 09/19/2015   HCT 34.8 (L) 09/19/2015   MCV 102.1 (H) 09/19/2015   PLT 169 09/19/2015   Lab Results  Component Value Date   NA 138 09/19/2015   K 4.1 09/19/2015   CL 105 09/19/2015   CO2 28 09/19/2015   BUN 16 09/19/2015   CREATININE 1.17 09/19/2015   GLUCOSE 127 (H) 09/19/2015   Lab Results  Component Value Date   INR 1.07 09/19/2015    Assessment/Plan: Patient admitted for lum lam for stenosis. Patient has failed a reasonable attempt at conservative therapy.  I explained the condition and procedure to the patient and answered any questions.  Patient wishes to proceed with procedure as planned. Understands risks/ benefits and typical outcomes of procedure.   Keyvon Herter S 09/27/2015 7:07 AM

## 2015-09-28 ENCOUNTER — Encounter (HOSPITAL_COMMUNITY): Payer: Self-pay | Admitting: Neurological Surgery

## 2015-09-28 DIAGNOSIS — M4806 Spinal stenosis, lumbar region: Secondary | ICD-10-CM | POA: Diagnosis not present

## 2015-09-28 DIAGNOSIS — Z8673 Personal history of transient ischemic attack (TIA), and cerebral infarction without residual deficits: Secondary | ICD-10-CM | POA: Diagnosis not present

## 2015-09-28 DIAGNOSIS — I2510 Atherosclerotic heart disease of native coronary artery without angina pectoris: Secondary | ICD-10-CM | POA: Diagnosis not present

## 2015-09-28 DIAGNOSIS — I10 Essential (primary) hypertension: Secondary | ICD-10-CM | POA: Diagnosis not present

## 2015-09-28 DIAGNOSIS — E039 Hypothyroidism, unspecified: Secondary | ICD-10-CM | POA: Diagnosis not present

## 2015-09-28 DIAGNOSIS — K219 Gastro-esophageal reflux disease without esophagitis: Secondary | ICD-10-CM | POA: Diagnosis not present

## 2015-09-28 MED ORDER — METHOCARBAMOL 500 MG PO TABS
500.0000 mg | ORAL_TABLET | Freq: Four times a day (QID) | ORAL | Status: DC | PRN
  Administered 2015-09-28: 500 mg via ORAL
  Filled 2015-09-28: qty 1

## 2015-09-28 MED ORDER — OXYCODONE HCL 5 MG PO TABS: 5.0000 mg | ORAL_TABLET | Freq: Four times a day (QID) | ORAL | 0 refills | Status: DC | PRN

## 2015-09-28 NOTE — Discharge Instructions (Signed)

## 2015-09-28 NOTE — Progress Notes (Signed)
OT Cancellation Note  Patient Details Name: Shawn Sanchez MRN: [id number] DOB: 1931-06-27   Cancelled Treatment:    Reason Eval/Treat Not Completed: Other (comment). Pt not in room on OT arrival. Per RN, pt was seen walking down the hall and out the door by other staff and is currently trying to be located. Will re-attempt OT evaluation later today if time allows.  Nils PyleJulia Alechia Lezama, OTR/L Pager: [phone] 09/28/2015, 8:08 AM

## 2015-09-28 NOTE — Progress Notes (Signed)
Patient ID: Shawn Sanchez, male   DOB: 10/09/1931, 82 y.o.   MRN: [id number] I stopped by to discharge the patient this morning at 10 AM and found him to be missing. There were 3 policeman and Shawn Sanchez security officers, and a missing persons report was being filed. He had been seen on security cameras a couple of times. I have been out and looked for him myself outside of the hospital grounds. The police are still searching for him.

## 2015-09-28 NOTE — Progress Notes (Signed)
Pt ambulated off the unit unsupervised @ 0745. Security and [employer] was notified. I called the daughter @ [phone] and she arrived to the hospital @ 0845. MD was notified. Hospital alerts have been made for this patient. Will continue to search for the patient. Rema FendtAshley Otie Headlee, RN

## 2015-09-28 NOTE — Progress Notes (Signed)
PT Cancellation Note  Patient Details Name: Shawn Sanchez MRN: [id number] DOB: 08-08-1931   Cancelled Treatment:    Reason Eval/Treat Not Completed: Patient at procedure or test/unavailable Pt not in room. Per RN, pt was seen walking down the hall and out the door by other staff and is currently trying to be located. Will re-attempt PT evaluation later today if time allows   [name] A [name] 09/28/2015, 11:40 AM [name], PT, DPT [phone]

## 2015-09-28 NOTE — Progress Notes (Signed)
Pt stated that he lost his upper dentures at the hospital when he was being D/C'd . Told Pt that I would file a report but Pt called when he arrived home and informed me that he found them at his house. Rema FendtAshley Rowen Wilmer, RN

## 2015-09-28 NOTE — Progress Notes (Signed)
Pt and daughter given D/C instructions with Rx, verbal understanding was provided. Pt's IV was removed prior to D/C. Pt's incision is clean and dry with no sign of infection. Pt D/C'd home via walking @ 1815 per MD order. Pt is stable @ D/C and has no other needs at this time. Rema FendtAshley Dael Howland, RN

## 2015-09-28 NOTE — Discharge Summary (Signed)
Physician Discharge Summary  Patient ID: Shawn Sanchez MRN: [id number] DOB/AGE: [birth year] 82 y.o.  Admit date: 09/27/2015 Discharge date: 09/28/2015  Admission Diagnoses: lumbar stenosis    Discharge Diagnoses: same   Discharged Condition: good  Hospital Course: The patient was admitted on 09/27/2015 and taken to the operating room where the patient underwent LL for stenosis. The patient tolerated the procedure well and was taken to the recovery room and then to the floor in stable condition. The hospital course was routine except that he left the hospital and walked home 3 miles before he was even discharged. There were no complications. The wound remained clean dry and intact. Pt had appropriate back soreness. No complaints of leg pain or new N/T/W. The patient remained afebrile with stable vital signs, and tolerated a regular diet. The patient continued to increase activities, and pain was well controlled with oral pain medications.   Consults: None  Significant Diagnostic Studies:  Results for orders placed or performed during the hospital encounter of 09/19/15  Surgical pcr screen  Result Value Ref Range   MRSA, PCR NEGATIVE NEGATIVE   Staphylococcus aureus NEGATIVE NEGATIVE  CBC WITH DIFFERENTIAL  Result Value Ref Range   WBC 6.5 4.0 - 10.5 K/uL   RBC 3.41 (L) 4.22 - 5.81 MIL/uL   Hemoglobin 11.3 (L) 13.0 - 17.0 g/dL   HCT 40.9 (L) 81.1 - 91.4 %   MCV 102.1 (H) 78.0 - 100.0 fL   MCH 33.1 26.0 - 34.0 pg   MCHC 32.5 30.0 - 36.0 g/dL   RDW 78.2 95.6 - 21.3 %   Platelets 169 150 - 400 K/uL   Neutrophils Relative % 58 %   Neutro Abs 3.7 1.7 - 7.7 K/uL   Lymphocytes Relative 27 %   Lymphs Abs 1.8 0.7 - 4.0 K/uL   Monocytes Relative 10 %   Monocytes Absolute 0.7 0.1 - 1.0 K/uL   Eosinophils Relative 5 %   Eosinophils Absolute 0.3 0.0 - 0.7 K/uL   Basophils Relative 0 %   Basophils Absolute 0.0 0.0 - 0.1 K/uL  Protime-INR  Result Value Ref Range   Prothrombin Time 14.0  11.4 - 15.2 seconds   INR 1.07   Comprehensive metabolic panel  Result Value Ref Range   Sodium 138 135 - 145 mmol/L   Potassium 4.1 3.5 - 5.1 mmol/L   Chloride 105 101 - 111 mmol/L   CO2 28 22 - 32 mmol/L   Glucose, Bld 127 (H) 65 - 99 mg/dL   BUN 16 6 - 20 mg/dL   Creatinine, Ser 0.86 0.61 - 1.24 mg/dL   Calcium 9.3 8.9 - 57.8 mg/dL   Total Protein 6.0 (L) 6.5 - 8.1 g/dL   Albumin 3.8 3.5 - 5.0 g/dL   AST 20 15 - 41 U/L   ALT 19 17 - 63 U/L   Alkaline Phosphatase 57 38 - 126 U/L   Total Bilirubin 0.7 0.3 - 1.2 mg/dL   GFR calc non Af Amer 56 (L) >60 mL/min   GFR calc Af Amer >60 >60 mL/min   Anion gap 5 5 - 15    Chest 2 View  Result Date: 09/19/2015 CLINICAL DATA:  Lumbar surgery. EXAM: CHEST  2 VIEW COMPARISON:  01/23/2015.  09/13/2008. FINDINGS: Mediastinum and hilar structures normal. Heart size stable. No pulmonary venous congestion. Bilateral interstitial prominence again noted. Although these changes may be chronic active interstitial process including pneumonitis cannot be completely excluded and clinical correlation suggested. Bilateral pleural parenchymal  thickening again noted consistent with scarring. No pleural effusion or pneumothorax. Degenerative changes thoracic spine. IMPRESSION: Bilateral interstitial prominence again noted. Although these changes may be chronic active interstitial process including pneumonitis cannot be completely excluded and clinical correlation suggested . Bilateral pleural-parenchymal scarring. Electronically Signed   By: Maisie Fus  Register   On: 09/19/2015 14:23   Dg Lumbar Spine 1 View  Result Date: 09/27/2015 CLINICAL DATA:  Laminectomy. EXAM: LUMBAR SPINE - 1 VIEW COMPARISON:  08/10/2015. FINDINGS: Lumbar vertebra numbered as per prior MRI 08/10/2015. Metallic probe noted posteriorly at L3-L4 level. IMPRESSION: Metallic probe noted posteriorly at L3-L4 level. Electronically Signed   By: Maisie Fus  Register   On: 09/27/2015 12:20    Antibiotics:   Anti-infectives    Start     Dose/Rate Route Frequency Ordered Stop   09/27/15 1900  ceFAZolin (ANCEF) IVPB 1 g/50 mL premix     1 g 100 mL/hr over 30 Minutes Intravenous Every 8 hours 09/27/15 1335 09/27/15 1830   09/27/15 1133  bacitracin 50,000 Units in sodium chloride irrigation 0.9 % 500 mL irrigation  Status:  Discontinued       As needed 09/27/15 1134 09/27/15 1225   09/27/15 1022  vancomycin (VANCOCIN) 1000 MG powder  Status:  Discontinued    Comments:  Caren Griffins   : cabinet override      09/27/15 1022 09/27/15 1229   09/27/15 1000  ceFAZolin (ANCEF) IVPB 2g/100 mL premix     2 g 200 mL/hr over 30 Minutes Intravenous To [facility] 09/26/15 1338 09/27/15 1055      Discharge Exam: Blood pressure 99/73, pulse 81, temperature 98.3 F (36.8 C), resp. rate 16, SpO2 100 %. Neurologic: Grossly normal Dressing dry  Discharge Medications:     Medication List    TAKE these medications   acetaminophen 500 MG tablet Commonly known as:  TYLENOL Take 1,000 mg by mouth every 6 (six) hours as needed for moderate pain.   carvedilol 3.125 MG tablet Commonly known as:  COREG Take 3.125 mg by mouth 2 (two) times daily with a meal.   cholecalciferol 1000 units tablet Commonly known as:  VITAMIN D Take 1,000 Units by mouth daily. Reported on 07/01/2015   clopidogrel 75 MG tablet Commonly known as:  PLAVIX Take 75 mg by mouth at bedtime.   levothyroxine 50 MCG tablet Commonly known as:  SYNTHROID, LEVOTHROID Take 50 mcg by mouth daily before breakfast.   Melatonin 5 MG Caps Take 2 capsules by mouth at bedtime. Reported on 07/01/2015   omeprazole 20 MG capsule Commonly known as:  PRILOSEC Take 20 mg by mouth every other day.   oxyCODONE 5 MG immediate release tablet Commonly known as:  Oxy IR/ROXICODONE Take 1 tablet (5 mg total) by mouth every 6 (six) hours as needed for severe pain.   simvastatin 20 MG tablet Commonly known as:  ZOCOR TAKE 1 TABLET BY MOUTH  DAILY   tamsulosin 0.4 MG Caps capsule Commonly known as:  FLOMAX Take 0.4 mg by mouth daily.       Disposition: home   Final Dx: lum lam for stenosis  Discharge Instructions     Remove dressing in 72 hours    Complete by:  As directed   Call MD for:  difficulty breathing, headache or visual disturbances    Complete by:  As directed   Call MD for:  persistant nausea and vomiting    Complete by:  As directed   Call MD for:  redness,  tenderness, or signs of infection (pain, swelling, redness, odor or green/yellow discharge around incision site)    Complete by:  As directed   Call MD for:  severe uncontrolled pain    Complete by:  As directed   Call MD for:  temperature >100.4    Complete by:  As directed   Diet - low sodium heart healthy    Complete by:  As directed   Discharge instructions    Complete by:  As directed   No heavy lifting, no bending or twisting, no driving   Increase activity slowly    Complete by:  As directed         Signed: Tharon Kitch S 09/28/2015, 5:18 PM

## 2015-09-28 NOTE — Progress Notes (Addendum)
Pt returned to the floor with the daughter. Pt is alert and oriented and stable. Dr. Yetta BarreJones notified. Pt stated that he "went home to feed his cat, read the mail, and layed down to rest". Pt also stated "that this was a fun adventure today". Will continue to monitor to Pt. Rema FendtAshley Kiernan Atkerson, RN

## 2015-10-25 DIAGNOSIS — R351 Nocturia: Secondary | ICD-10-CM | POA: Diagnosis not present

## 2015-10-25 DIAGNOSIS — N401 Enlarged prostate with lower urinary tract symptoms: Secondary | ICD-10-CM | POA: Diagnosis not present

## 2015-10-25 DIAGNOSIS — Z125 Encounter for screening for malignant neoplasm of prostate: Secondary | ICD-10-CM | POA: Diagnosis not present

## 2015-12-25 NOTE — Progress Notes (Signed)
HPI: fu coronary artery disease. 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 He underwent placement of Cypher drug-eluting stent to the distal right coronary artery and the mid LAD. At that time, ejection fraction was normal. Cath 12/09 with nonobstructive CAD and patent stents. Nuclear study 7/13 showed EF 67 and normal perfusion. 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 Seen in ER 8/15 with possible TIA. Echo 8/15 showed normal LV function. MRA 8/15 showed small vessel change but no significant stenosis. Since last seen, the patient has dyspnea with more extreme activities but not with routine activities. It is relieved with rest. It is not associated with chest pain. There is no orthopnea, PND or pedal edema. There is no syncope or palpitations. There is no exertional chest pain.   Current Outpatient Prescriptions  Medication Sig Dispense Refill  . acetaminophen (TYLENOL) 500 MG tablet Take 1,000 mg by mouth every 6 (six) hours as needed for moderate pain.    . carvedilol (COREG) 3.125 MG tablet Take 3.125 mg by mouth 2 (two) times daily with a meal.    . cholecalciferol (VITAMIN D) 1000 UNITS tablet Take 1,000 Units by mouth daily. Reported on 07/01/2015    . clopidogrel (PLAVIX) 75 MG tablet Take 75 mg by mouth at bedtime.     Marland Kitchen. levothyroxine (SYNTHROID, LEVOTHROID) 50 MCG tablet Take 50 mcg by mouth daily before breakfast.     . Melatonin 5 MG CAPS Take 2 capsules by mouth at bedtime. Reported on 07/01/2015    . omeprazole (PRILOSEC) 20 MG capsule Take 20 mg by mouth every other day.     . simvastatin (ZOCOR) 20 MG tablet TAKE 1 TABLET BY MOUTH DAILY 90 tablet 3  . Tamsulosin HCl (FLOMAX) 0.4 MG CAPS Take 0.4 mg by mouth daily.      No current facility-administered medications for this visit.      Past Medical History:  Diagnosis Date  . Anemia, unspecified   . Anxiety   . Arthritis   . Carpal tunnel syndrome   .  Coronary atherosclerosis of unspecified type of vessel, native or graft   . Depression   . Esophageal reflux   . Hemorrhage of rectum and anus   . Hernia, inguinal   . History of bronchitis   . Hyperchylomicronemia   . Hypothyroidism   . PONV (postoperative nausea and vomiting)   . Shortness of breath dyspnea   . TIA (transient ischemic attack)   . Unspecified disorder of kidney and ureter   . Unspecified essential hypertension     Past Surgical History:  Procedure Laterality Date  . angioplasy/stent  2006  . carpal tunnel release Bilateral   . COLONOSCOPY    . lamenectomy  1965  . LUMBAR LAMINECTOMY/DECOMPRESSION MICRODISCECTOMY N/A 09/27/2015   Procedure: Laminectomy and Foraminotomy - Lumbar three-four,  Lumbar four-five;  Surgeon: Tia Alertavid S Jones, MD;  Location: [facility];  Service: Neurosurgery;  Laterality: N/A;    Social History   Social History  . Marital status: Married    Spouse name: N/A  . Number of children: N/A  . Years of education: N/A   Occupational History  . Not on file.   Social History Main Topics  . Smoking status: Former Smoker    Types: Pipe, Cigars    Quit date: 10/10/1999  . Smokeless tobacco: Never Used     Comment: cigars only in 2005  . Alcohol use  No     Comment: Scotch. Stopped drinking 2-3 days ago.   . Drug use: No  . Sexual activity: Not on file   Other Topics Concern  . Not on file   Social History Narrative   ** Merged History Encounter **       Retired; divorced. Daily Caffeine use- 6-7 cups. Pt does not get regular exercise.     Family History  Problem Relation Age of Onset  . Breast cancer Mother   . Lung cancer Mother   . Diabetes Sister   . Lung cancer Father   . Coronary artery disease Father   . Hypertension Father   . Colon cancer Neg Hx     ROS: no fevers or chills, productive cough, hemoptysis, dysphasia, odynophagia, melena, hematochezia, dysuria, hematuria, rash, seizure activity, orthopnea, PND, pedal  edema, claudication. Remaining systems are negative.  Physical Exam: Well-developed well-nourished in no acute distress.  Skin is warm and dry.  HEENT is normal.  Neck is supple.  Chest is clear to auscultation with normal expansion.  Cardiovascular exam is regular rate and rhythm.  Abdominal exam nontender or distended. No masses palpated. Extremities show no edema. neuro grossly intact  ECG-sinus rhythm at a rate of 64. Cannot rule out prior anterior infarct.  A/P  1 coronary artery disease-continue aspirin and statin.  2 hypertension-blood pressure controlled. Continue present medications.  3 hyperlipidemia-continue statin. He has declined high-dose statins previously.  4. Preoperative evaluation-patient will require inguinal hernia repair. He is able to ambulate at least 1 mile with no chest pain or dyspnea. It is not a high risk procedure. We will allow him to proceed without further ischemia evaluation.   Olga MillersBrian Faithlyn Recktenwald, MD

## 2015-12-28 ENCOUNTER — Encounter: Payer: Self-pay | Admitting: Cardiology

## 2015-12-28 ENCOUNTER — Ambulatory Visit (INDEPENDENT_AMBULATORY_CARE_PROVIDER_SITE_OTHER): Payer: Medicare Other | Admitting: Cardiology

## 2015-12-28 VITALS — BP 100/62 | HR 64 | Ht 66.0 in | Wt 153.0 lb

## 2015-12-28 DIAGNOSIS — I10 Essential (primary) hypertension: Secondary | ICD-10-CM

## 2015-12-28 DIAGNOSIS — E7800 Pure hypercholesterolemia, unspecified: Secondary | ICD-10-CM

## 2015-12-28 DIAGNOSIS — I2510 Atherosclerotic heart disease of native coronary artery without angina pectoris: Secondary | ICD-10-CM | POA: Diagnosis not present

## 2015-12-28 NOTE — Patient Instructions (Signed)
Your physician wants you to follow-up in: ONE YEAR WITH DR CRENSHAW You will receive a reminder letter in the mail two months in advance. If you don't receive a letter, please call our office to schedule the follow-up appointment.   If you need a refill on your cardiac medications before your next appointment, please call your pharmacy.  

## 2016-01-20 DIAGNOSIS — J209 Acute bronchitis, unspecified: Secondary | ICD-10-CM | POA: Diagnosis not present

## 2016-01-30 ENCOUNTER — Ambulatory Visit: Payer: Self-pay | Admitting: Surgery

## 2016-01-30 DIAGNOSIS — K4090 Unilateral inguinal hernia, without obstruction or gangrene, not specified as recurrent: Secondary | ICD-10-CM | POA: Diagnosis not present

## 2016-02-01 NOTE — Patient Instructions (Addendum)
Shawn Sanchez  02/01/2016   Your procedure is scheduled on: 02/06/2016  [facility]  Entrance take [place]  elevators to 3rd floor to  Short Stay Center at [phone].  Call this number if you have problems the morning of surgery [phone]   Remember: ONLY 1 PERSON MAY GO WITH YOU TO SHORT STAY TO GET  READY MORNING OF YOUR SURGERY.  Do not eat food or drink liquids :After Midnight.     Take these medicines the morning of surgery with A SIP OF WATER: Carvedilol(COREG), Levothyroxine(Synthroid), Omeprazole(Prislosec), Tamsulosin(Flomax), Bendadryl (allerdy relief medicine) as needed, Tylenol as needed                                You may not have any metal on your body including hair pins and              piercings  Do not wear jewelry, make-up, lotions, powders or perfumes, deodorant             Do not wear nail polish.  Do not shave  48 hours prior to surgery.              Men may shave face and neck.   Do not bring valuables to the hospital. [facility] IS NOT             RESPONSIBLE   FOR VALUABLES.  Contacts, dentures or bridgework may not be worn into surgery.  Leave suitcase in the car. After surgery it may be brought to your room.     Patients discharged the day of surgery will not be allowed to drive home.  Name and phone number of your driver:  Special Instructions: N/A              Please read over the following fact sheets you were given: _____________________________________________________________________             [facility] - Preparing for [facility] Before surgery, you can play an important role.  Because skin is not sterile, your skin needs to be as free of germs as possible.  You can reduce the number of germs on your skin by washing with CHG (chlorahexidine gluconate) soap before surgery.  CHG is an antiseptic cleaner which kills germs and bonds with the skin to continue killing germs even after washing. Please DO NOT use  if you have an allergy to CHG or antibacterial soaps.  If your skin becomes reddened/irritated stop using the CHG and inform your nurse when you arrive at Short Stay. Do not shave (including legs and underarms) for at least 48 hours prior to the first CHG shower.  You may shave your face/neck. Please follow these instructions carefully:  1.  Shower with CHG Soap the night before surgery and the  morning of Surgery.  2.  If you choose to wash your hair, wash your hair first as usual with your  normal  shampoo.  3.  After you shampoo, rinse your hair and body thoroughly to remove the  shampoo.                           4.  Use CHG as you would any other liquid soap.  You can apply chg directly  to the skin and wash  Gently with a scrungie or clean washcloth.  5.  Apply the CHG Soap to your body ONLY FROM THE NECK DOWN.   Do not use on face/ open                           Wound or open sores. Avoid contact with eyes, ears mouth and genitals (private parts).                       Wash face,  Genitals (private parts) with your normal soap.             6.  Wash thoroughly, paying special attention to the area where your surgery  will be performed.  7.  Thoroughly rinse your body with warm water from the neck down.  8.  DO NOT shower/wash with your normal soap after using and rinsing off  the CHG Soap.                9.  Pat yourself dry with a clean towel.            10.  Wear clean pajamas.            11.  Place clean sheets on your bed the night of your first shower and do not  sleep with pets. Day of Surgery : Do not apply any lotions/deodorants the morning of surgery.  Please wear clean clothes to the hospital/surgery center.  FAILURE TO FOLLOW THESE INSTRUCTIONS MAY RESULT IN THE CANCELLATION OF YOUR SURGERY PATIENT SIGNATURE_________________________________  NURSE  SIGNATURE__________________________________  ________________________________________________________________________

## 2016-02-02 DIAGNOSIS — Z6823 Body mass index (BMI) 23.0-23.9, adult: Secondary | ICD-10-CM | POA: Diagnosis not present

## 2016-02-02 DIAGNOSIS — G4709 Other insomnia: Secondary | ICD-10-CM | POA: Diagnosis not present

## 2016-02-02 DIAGNOSIS — R413 Other amnesia: Secondary | ICD-10-CM | POA: Diagnosis not present

## 2016-02-02 DIAGNOSIS — I129 Hypertensive chronic kidney disease with stage 1 through stage 4 chronic kidney disease, or unspecified chronic kidney disease: Secondary | ICD-10-CM | POA: Diagnosis not present

## 2016-02-02 DIAGNOSIS — J40 Bronchitis, not specified as acute or chronic: Secondary | ICD-10-CM | POA: Diagnosis not present

## 2016-02-02 DIAGNOSIS — I2510 Atherosclerotic heart disease of native coronary artery without angina pectoris: Secondary | ICD-10-CM | POA: Diagnosis not present

## 2016-02-02 DIAGNOSIS — F331 Major depressive disorder, recurrent, moderate: Secondary | ICD-10-CM | POA: Diagnosis not present

## 2016-02-02 DIAGNOSIS — K4090 Unilateral inguinal hernia, without obstruction or gangrene, not specified as recurrent: Secondary | ICD-10-CM | POA: Diagnosis not present

## 2016-02-02 DIAGNOSIS — E038 Other specified hypothyroidism: Secondary | ICD-10-CM | POA: Diagnosis not present

## 2016-02-05 ENCOUNTER — Encounter (INDEPENDENT_AMBULATORY_CARE_PROVIDER_SITE_OTHER): Payer: Self-pay

## 2016-02-05 ENCOUNTER — Ambulatory Visit (HOSPITAL_COMMUNITY)
Admission: RE | Admit: 2016-02-05 | Discharge: 2016-02-05 | Disposition: A | Discharge status: Home / Self Care | Payer: Medicare Other | Source: Ambulatory Visit | Attending: Anesthesiology | Admitting: Anesthesiology

## 2016-02-05 ENCOUNTER — Encounter (HOSPITAL_COMMUNITY): Payer: Self-pay | Admitting: Surgery

## 2016-02-05 ENCOUNTER — Encounter (HOSPITAL_COMMUNITY)
Admission: RE | Admit: 2016-02-05 | Discharge: 2016-02-05 | Disposition: A | Discharge status: Home / Self Care | Payer: Medicare Other | Source: Ambulatory Visit | Attending: Surgery | Admitting: Surgery

## 2016-02-05 ENCOUNTER — Encounter (HOSPITAL_COMMUNITY): Payer: Self-pay

## 2016-02-05 DIAGNOSIS — R9389 Abnormal findings on diagnostic imaging of other specified body structures: Secondary | ICD-10-CM

## 2016-02-05 DIAGNOSIS — R918 Other nonspecific abnormal finding of lung field: Secondary | ICD-10-CM | POA: Diagnosis not present

## 2016-02-05 DIAGNOSIS — Z01812 Encounter for preprocedural laboratory examination: Secondary | ICD-10-CM | POA: Insufficient documentation

## 2016-02-05 DIAGNOSIS — K4090 Unilateral inguinal hernia, without obstruction or gangrene, not specified as recurrent: Secondary | ICD-10-CM

## 2016-02-05 DIAGNOSIS — Z01818 Encounter for other preprocedural examination: Secondary | ICD-10-CM | POA: Insufficient documentation

## 2016-02-05 HISTORY — DX: Pneumonia, unspecified organism: J18.9

## 2016-02-05 HISTORY — DX: Unspecified dementia, unspecified severity, without behavioral disturbance, psychotic disturbance, mood disturbance, and anxiety: F03.90

## 2016-02-05 HISTORY — DX: Unspecified mononeuropathy of bilateral lower limbs: G57.93

## 2016-02-05 HISTORY — DX: Personal history of other diseases of the digestive system: Z87.19

## 2016-02-05 LAB — CBC
HCT: 35.3 % — ABNORMAL LOW (ref 39.0–52.0)
Hemoglobin: 11.9 g/dL — ABNORMAL LOW (ref 13.0–17.0)
MCH: 31.4 pg (ref 26.0–34.0)
MCHC: 33.7 g/dL (ref 30.0–36.0)
MCV: 93.1 fL (ref 78.0–100.0)
PLATELETS: 195 10*3/uL (ref 150–400)
RBC: 3.79 MIL/uL — ABNORMAL LOW (ref 4.22–5.81)
RDW: 13 % (ref 11.5–15.5)
WBC: 8.4 10*3/uL (ref 4.0–10.5)

## 2016-02-05 LAB — COMPREHENSIVE METABOLIC PANEL
ALK PHOS: 92 U/L (ref 38–126)
ALT: 27 U/L (ref 17–63)
AST: 24 U/L (ref 15–41)
Albumin: 3.9 g/dL (ref 3.5–5.0)
Anion gap: 6 (ref 5–15)
BILIRUBIN TOTAL: 0.7 mg/dL (ref 0.3–1.2)
BUN: 19 mg/dL (ref 6–20)
CALCIUM: 9.3 mg/dL (ref 8.9–10.3)
CHLORIDE: 105 mmol/L (ref 101–111)
CO2: 28 mmol/L (ref 22–32)
CREATININE: 1.16 mg/dL (ref 0.61–1.24)
GFR calc Af Amer: >60 mL/min (ref >60)
GFR, EST NON AFRICAN AMERICAN: 57 mL/min — AB (ref >60)
Glucose, Bld: 79 mg/dL (ref 65–99)
Potassium: 4.2 mmol/L (ref 3.5–5.1)
Sodium: 139 mmol/L (ref 135–145)
TOTAL PROTEIN: 6.4 g/dL — AB (ref 6.5–8.1)

## 2016-02-05 NOTE — Progress Notes (Signed)
Cardio clearance  12/28/2015 brian crenshaw md epic LOV 07/26/15 kristi smith md epic Echo 08/28/13 epic cxr 09/19/15 epic

## 2016-02-05 NOTE — H&P (Signed)
[facility]  Shawn Sanchez DOB: Aug 24, 1931 Single / Language: [place] / Race: White Male  History of Present Illness   The patient is a 83 year old male who presents with an inguinal hernia.  Patient returns to our practice to discuss inguinal hernia repair. He was evaluated by one of my partners during the summer months. Due to some other situations, his surgery was delayed. 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 He wishes to see me today to discuss left inguinal hernia repair. Patient notes that hernias been present for at least a year. He has had a few episodes of significant pain. It has always been reducible with some manipulation. He denies any change in bowel habits. He has had no prior abdominal surgery and no prior hernia repairs.   Allergies  Aricept *PSYCHOTHERAPEUTIC AND NEUROLOGICAL AGENTS - MISC.*  Procaine HCl *LOCAL ANESTHETICS-Parenteral*   Medication History Carvedilol (3.125MG  Tablet, Oral) Active. Clopidogrel Bisulfate (75MG  Tablet, Oral) Active. Synthroid (50MCG Tablet, Oral) Active. Tamsulosin HCl (0.4MG  Capsule, Oral) Active. Simvastatin (20MG  Tablet, Oral) Active. Omeprazole (20MG  Capsule DR, Oral) Active. Tylenol (500MG  Capsule, Oral) Active. Vitamin D (Cholecalciferol) (1000UNIT Tablet, Oral) Active. Medications Reconciled  Vitals 01/30/2016 11:10 AM Weight: 151 lb Height: 68in Body Surface Area: 1.81 m Body Mass Index: 22.96 kg/m  Temp.: 37F  Pulse: 76 (Regular)  BP: 120/62 (Sitting, Left Arm, Standard)   Physical Exam  The physical exam findings are as follows: Note:General - appears comfortable, no distress; not diaphorectic  HEENT - normocephalic; sclerae clear, gaze conjugate; mucous membranes moist, dentition good; voice normal  Neck - symmetric on extension; no palpable anterior or posterior cervical adenopathy; no palpable masses in the thyroid  bed  Chest - clear bilaterally without rhonchi, rales, or wheeze  Cor - regular rhythm with normal rate; no significant murmur  Abd - soft without distension; no sign of umbilical hernia  GU - normal male genitalia without mass or lesion; palpation in the right inguinal canal with cough and Valsalva shows no sign of hernia; obvious bulge in the left groin region consistent with inguinal hernia; hernia is reducible with manipulation; hernia augments with coughing and Valsalva  Ext - non-tender without significant edema or lymphedema  Neuro - grossly intact; no tremor    Assessment & Plan  LEFT INGUINAL HERNIA (K40.90)  Pt Education - Pamphlet Given - Hernia Surgery: discussed with patient and provided information. Patient presents with symptomatic left inguinal hernia. He wishes to proceed with repair. Patient is provided with written literature on hernia repair to review at home.  Patient has a reducible left inguinal hernia which is symptomatic. I have recommended open repair with mesh. We have discussed the risk and benefits of the procedure. We have discussed the potential for recurrence. We have discussed restrictions on his activities after the procedure. This will be performed as an outpatient procedure. Patient will need to stop his Plavix prior to surgery. Patient understands and wishes to proceed.  The risks and benefits of the procedure have been discussed at length with the patient. The patient understands the proposed procedure, potential alternative treatments, and the course of recovery to be expected. All of the patient's questions have been answered at this time. The patient wishes to proceed with surgery.  Velora Hecklerodd M. Aylan Bayona, MD, [facility] Office: [phone]

## 2016-02-06 ENCOUNTER — Ambulatory Visit (HOSPITAL_COMMUNITY): Payer: Medicare Other | Admitting: Anesthesiology

## 2016-02-06 ENCOUNTER — Encounter (HOSPITAL_COMMUNITY): Payer: Self-pay | Admitting: *Deleted

## 2016-02-06 ENCOUNTER — Encounter (HOSPITAL_COMMUNITY)
Admission: RE | Disposition: A | Discharge status: Home / Self Care | Payer: Self-pay | Source: Ambulatory Visit | Attending: Surgery

## 2016-02-06 ENCOUNTER — Ambulatory Visit (HOSPITAL_COMMUNITY)
Admission: RE | Admit: 2016-02-06 | Discharge: 2016-02-06 | Disposition: A | Discharge status: Home / Self Care | Payer: Medicare Other | Source: Ambulatory Visit | Attending: Surgery | Admitting: Surgery

## 2016-02-06 DIAGNOSIS — Z87891 Personal history of nicotine dependence: Secondary | ICD-10-CM | POA: Diagnosis not present

## 2016-02-06 DIAGNOSIS — Z79899 Other long term (current) drug therapy: Secondary | ICD-10-CM | POA: Diagnosis not present

## 2016-02-06 DIAGNOSIS — K4090 Unilateral inguinal hernia, without obstruction or gangrene, not specified as recurrent: Secondary | ICD-10-CM | POA: Insufficient documentation

## 2016-02-06 DIAGNOSIS — I10 Essential (primary) hypertension: Secondary | ICD-10-CM | POA: Diagnosis not present

## 2016-02-06 DIAGNOSIS — I2510 Atherosclerotic heart disease of native coronary artery without angina pectoris: Secondary | ICD-10-CM | POA: Diagnosis not present

## 2016-02-06 DIAGNOSIS — G459 Transient cerebral ischemic attack, unspecified: Secondary | ICD-10-CM | POA: Diagnosis not present

## 2016-02-06 DIAGNOSIS — E785 Hyperlipidemia, unspecified: Secondary | ICD-10-CM | POA: Diagnosis not present

## 2016-02-06 HISTORY — PX: INSERTION OF MESH: SHX5868

## 2016-02-06 HISTORY — PX: INGUINAL HERNIA REPAIR: SHX194

## 2016-02-06 SURGERY — REPAIR, HERNIA, INGUINAL, ADULT
Anesthesia: General | Laterality: Left

## 2016-02-06 MED ORDER — LIDOCAINE 2% (20 MG/ML) 5 ML SYRINGE
INTRAMUSCULAR | Status: AC
  Filled 2016-02-06: qty 5

## 2016-02-06 MED ORDER — ACETAMINOPHEN 10 MG/ML IV SOLN
INTRAVENOUS | Status: AC
  Filled 2016-02-06: qty 100

## 2016-02-06 MED ORDER — DEXAMETHASONE SODIUM PHOSPHATE 10 MG/ML IJ SOLN
INTRAMUSCULAR | Status: AC
  Filled 2016-02-06: qty 1

## 2016-02-06 MED ORDER — PHENYLEPHRINE 40 MCG/ML (10ML) SYRINGE FOR IV PUSH (FOR BLOOD PRESSURE SUPPORT)
PREFILLED_SYRINGE | INTRAVENOUS | Status: DC | PRN
  Administered 2016-02-06: 120 ug via INTRAVENOUS
  Administered 2016-02-06 (×3): 80 ug via INTRAVENOUS

## 2016-02-06 MED ORDER — ONDANSETRON HCL 4 MG/2ML IJ SOLN
INTRAMUSCULAR | Status: AC
  Filled 2016-02-06: qty 2

## 2016-02-06 MED ORDER — DEXAMETHASONE SODIUM PHOSPHATE 10 MG/ML IJ SOLN
INTRAMUSCULAR | Status: DC | PRN
  Administered 2016-02-06: 10 mg via INTRAVENOUS

## 2016-02-06 MED ORDER — CEFAZOLIN SODIUM-DEXTROSE 2-4 GM/100ML-% IV SOLN
2.0000 g | INTRAVENOUS | Status: AC
  Administered 2016-02-06: 2 g via INTRAVENOUS
  Filled 2016-02-06: qty 100

## 2016-02-06 MED ORDER — HYDROCODONE-ACETAMINOPHEN 5-325 MG PO TABS: 1.0000 | ORAL_TABLET | ORAL | 0 refills | Status: DC | PRN

## 2016-02-06 MED ORDER — PHENYLEPHRINE HCL 10 MG/ML IJ SOLN
INTRAMUSCULAR | Status: AC
  Filled 2016-02-06: qty 1

## 2016-02-06 MED ORDER — BUPIVACAINE HCL (PF) 0.5 % IJ SOLN
INTRAMUSCULAR | Status: AC
  Filled 2016-02-06: qty 30

## 2016-02-06 MED ORDER — PHENYLEPHRINE HCL 10 MG/ML IJ SOLN
INTRAVENOUS | Status: DC | PRN
  Administered 2016-02-06: 50 ug/min via INTRAVENOUS

## 2016-02-06 MED ORDER — FENTANYL CITRATE (PF) 100 MCG/2ML IJ SOLN
INTRAMUSCULAR | Status: AC
  Filled 2016-02-06: qty 2

## 2016-02-06 MED ORDER — CEFAZOLIN SODIUM-DEXTROSE 2-4 GM/100ML-% IV SOLN
INTRAVENOUS | Status: AC
  Filled 2016-02-06: qty 100

## 2016-02-06 MED ORDER — FENTANYL CITRATE (PF) 100 MCG/2ML IJ SOLN: 25.0000 ug | INTRAMUSCULAR | Status: DC | PRN

## 2016-02-06 MED ORDER — LIDOCAINE 2% (20 MG/ML) 5 ML SYRINGE
INTRAMUSCULAR | Status: DC | PRN
  Administered 2016-02-06: 100 mg via INTRAVENOUS

## 2016-02-06 MED ORDER — ALBUTEROL SULFATE (2.5 MG/3ML) 0.083% IN NEBU
2.5000 mg | INHALATION_SOLUTION | Freq: Once | RESPIRATORY_TRACT | Status: AC
  Administered 2016-02-06: 2.5 mg via RESPIRATORY_TRACT

## 2016-02-06 MED ORDER — MEPERIDINE HCL 50 MG/ML IJ SOLN: 6.2500 mg | INTRAMUSCULAR | Status: DC | PRN

## 2016-02-06 MED ORDER — BUPIVACAINE HCL (PF) 0.5 % IJ SOLN
INTRAMUSCULAR | Status: DC | PRN
  Administered 2016-02-06: 20 mL

## 2016-02-06 MED ORDER — PROPOFOL 10 MG/ML IV BOLUS
INTRAVENOUS | Status: AC
  Filled 2016-02-06: qty 20

## 2016-02-06 MED ORDER — 0.9 % SODIUM CHLORIDE (POUR BTL) OPTIME
TOPICAL | Status: DC | PRN
  Administered 2016-02-06: 1000 mL

## 2016-02-06 MED ORDER — LACTATED RINGERS IV SOLN
INTRAVENOUS | Status: DC | PRN
  Administered 2016-02-06: 09:00:00 via INTRAVENOUS
  Administered 2016-02-06: 1000 mL

## 2016-02-06 MED ORDER — HYDROCODONE-ACETAMINOPHEN 5-325 MG PO TABS: 1.0000 | ORAL_TABLET | ORAL | Status: DC | PRN

## 2016-02-06 MED ORDER — IBUPROFEN 400 MG PO TABS
400.0000 mg | ORAL_TABLET | Freq: Once | ORAL | Status: AC
  Administered 2016-02-06: 400 mg via ORAL
  Filled 2016-02-06 (×2): qty 1

## 2016-02-06 MED ORDER — ACETAMINOPHEN 10 MG/ML IV SOLN
INTRAVENOUS | Status: DC | PRN
  Administered 2016-02-06: 1000 mg via INTRAVENOUS

## 2016-02-06 MED ORDER — FENTANYL CITRATE (PF) 100 MCG/2ML IJ SOLN
INTRAMUSCULAR | Status: DC | PRN
  Administered 2016-02-06 (×4): 25 ug via INTRAVENOUS

## 2016-02-06 MED ORDER — ONDANSETRON HCL 4 MG/2ML IJ SOLN: 4.0000 mg | Freq: Once | INTRAMUSCULAR | Status: DC | PRN

## 2016-02-06 MED ORDER — ALBUTEROL SULFATE (2.5 MG/3ML) 0.083% IN NEBU
INHALATION_SOLUTION | RESPIRATORY_TRACT | Status: AC
  Filled 2016-02-06: qty 3

## 2016-02-06 MED ORDER — PROPOFOL 10 MG/ML IV BOLUS
INTRAVENOUS | Status: DC | PRN
  Administered 2016-02-06: 150 mg via INTRAVENOUS

## 2016-02-06 SURGICAL SUPPLY — 38 items
APL SKNCLS STERI-STRIP NONHPOA (GAUZE/BANDAGES/DRESSINGS)
APPLICATOR COTTON TIP 6IN STRL (MISCELLANEOUS) ×3 IMPLANT
BENZOIN TINCTURE PRP APPL 2/3 (GAUZE/BANDAGES/DRESSINGS) ×1 IMPLANT
BLADE SURG 15 STRL LF DISP TIS (BLADE) ×1 IMPLANT
BLADE SURG 15 STRL SS (BLADE) ×3
BLADE SURG SZ10 CARB STEEL (BLADE) IMPLANT
CHLORAPREP W/TINT 26ML (MISCELLANEOUS) ×4 IMPLANT
CLOSURE WOUND 1/2 X4 (GAUZE/BANDAGES/DRESSINGS) ×1
COVER SURGICAL LIGHT HANDLE (MISCELLANEOUS) ×3 IMPLANT
DECANTER SPIKE VIAL GLASS SM (MISCELLANEOUS) ×3 IMPLANT
DRAIN PENROSE 18X1/2 LTX STRL (DRAIN) ×3 IMPLANT
DRAPE LAPAROTOMY TRNSV 102X78 (DRAPE) ×3 IMPLANT
ELECT PENCIL ROCKER SW 15FT (MISCELLANEOUS) ×3 IMPLANT
ELECT REM PT RETURN 9FT ADLT (ELECTROSURGICAL) ×3
ELECTRODE REM PT RTRN 9FT ADLT (ELECTROSURGICAL) ×1 IMPLANT
GAUZE SPONGE 4X4 12PLY STRL (GAUZE/BANDAGES/DRESSINGS) ×3 IMPLANT
GLOVE BIOGEL PI IND STRL 7.0 (GLOVE) ×1 IMPLANT
GLOVE BIOGEL PI INDICATOR 7.0 (GLOVE) ×2
GLOVE SURG ORTHO 8.0 STRL STRW (GLOVE) ×3 IMPLANT
GOWN STRL REUS W/TWL LRG LVL3 (GOWN DISPOSABLE) ×3 IMPLANT
GOWN STRL REUS W/TWL XL LVL3 (GOWN DISPOSABLE) ×6 IMPLANT
KIT BASIN OR (CUSTOM PROCEDURE TRAY) ×3 IMPLANT
MESH ULTRAPRO 3X6 7.6X15CM (Mesh General) ×2 IMPLANT
NDL HYPO 25X1 1.5 SAFETY (NEEDLE) ×1 IMPLANT
NEEDLE HYPO 25X1 1.5 SAFETY (NEEDLE) ×3 IMPLANT
PACK BASIC VI WITH GOWN DISP (CUSTOM PROCEDURE TRAY) ×3 IMPLANT
SPONGE LAP 4X18 X RAY DECT (DISPOSABLE) ×7 IMPLANT
STRIP CLOSURE SKIN 1/2X4 (GAUZE/BANDAGES/DRESSINGS) ×2 IMPLANT
SUT MNCRL AB 4-0 PS2 18 (SUTURE) ×3 IMPLANT
SUT NOVA NAB GS-21 0 18 T12 DT (SUTURE) ×2 IMPLANT
SUT NOVA NAB GS-22 2 0 T19 (SUTURE) ×6 IMPLANT
SUT SILK 2 0 SH (SUTURE) ×3 IMPLANT
SUT VIC AB 3-0 SH 18 (SUTURE) ×5 IMPLANT
SYR BULB IRRIGATION 50ML (SYRINGE) ×3 IMPLANT
SYR CONTROL 10ML LL (SYRINGE) ×3 IMPLANT
TAPE PAPER 3X10 WHT MICROPORE (GAUZE/BANDAGES/DRESSINGS) ×2 IMPLANT
TOWEL OR 17X26 10 PK STRL BLUE (TOWEL DISPOSABLE) ×3 IMPLANT
YANKAUER SUCT BULB TIP 10FT TU (MISCELLANEOUS) ×3 IMPLANT

## 2016-02-06 NOTE — Progress Notes (Signed)
Ambulated from 1301 to end of hallway. One person assist. To bathroom and able to void clear yellow urine.

## 2016-02-06 NOTE — Anesthesia Procedure Notes (Signed)
Procedure Name: LMA Insertion Date/Time: 02/06/2016 9:26 AM Performed by: Leroy LibmanEARDON, Eldridge Marcott L Patient Re-evaluated:Patient Re-evaluated prior to inductionOxygen Delivery Method: Circle system utilized Preoxygenation: Pre-oxygenation with 100% oxygen Intubation Type: IV induction Ventilation: Mask ventilation without difficulty LMA: LMA inserted LMA Size: 4.0 Number of attempts: 1 Placement Confirmation: positive ETCO2 and breath sounds checked- equal and bilateral Tube secured with: Tape Dental Injury: Teeth and Oropharynx as per pre-operative assessment

## 2016-02-06 NOTE — Interval H&P Note (Signed)
History and Physical Interval Note:  02/06/2016 8:50 AM  Shawn Sanchez  has presented today for surgery, with the diagnosis of left inguinal hernia, reducible  The various methods of treatment have been discussed with the patient and family. After consideration of risks, benefits and other options for treatment, the patient has consented to    Procedure(s): REPAIR LEFT INGUINAL HERNIA   (Left) INSERTION OF MESH (Left) as a surgical intervention .    The patient's history has been reviewed, patient examined, no change in status, stable for surgery.  I have reviewed the patient's chart and labs.  Questions were answered to the patient's satisfaction.    Velora Hecklerodd M. Cullin Dishman, MD, [facility] Office: [phone]    Braylynn Lewing MJudie Petit

## 2016-02-06 NOTE — Discharge Instructions (Signed)

## 2016-02-06 NOTE — Op Note (Signed)
Inguinal Hernia, Open, Procedure Note  Pre-operative Diagnosis:  Left inguinal hernia  Post-operative Diagnosis: same  Surgeon:  Velora Hecklerodd M. Yanni Quiroa, MD, FACS  Anesthesia:  General  Preparation:  Chlora-prep  Estimated Blood Loss: Minimal  Complications:  none  Indications: The patient is a 83 year old male who presents with an inguinal hernia.  Patient returns to our practice to discuss inguinal hernia repair. He was evaluated by one of my partners during the summer months. Due to some other situations, his surgery was delayed. 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 He wishes to see me today to discuss left inguinal hernia repair. Patient notes that hernias been present for at least a year. He has had a few episodes of significant pain. It has always been reducible with some manipulation. He denies any change in bowel habits. He has had no prior abdominal surgery and no prior hernia repairs.   Procedure Details  The patient was evaluated in the holding area. All of the patient's questions were answered and the proposed procedure was confirmed. The site of the procedure was properly marked. The patient was taken to the Operating Room, identified by name, and the procedure verified as inguinal hernia repair.  The patient was placed in the supine position and underwent induction of anesthesia. A "Time Out" was performed per routine. The lower abdomen and groin were prepped and draped in the usual aseptic fashion.  After ascertaining that an adequate level of anesthesia had been obtained, an incision was made in the groin with a #10 blade.  Dissection was carried through the subcutaneous tissues and hemostasis obtained with the electrocautery.  A Gelpi retractor was placed for exposure.  The external oblique fascia was incised in line with it's fibers and extended through the external inguinal ring.  The cord structures were dissected out of the inguinal canal and encircled  with a Penrose drain.  The floor of the inguinal canal was dissected out.  There was a small direct component to the hernia.  The cord was explored and a large sliding type hernia was identified.  It was dissected away from the cord structures and reduced.  The lateral internal ring was closed with interrupted 0-Novofil interrupted sutures.  The floor of the inguinal canal was reconstructed with Ethicon Ultrapro mesh cut to the appropriate dimensions.  It was secured to the pubic tubercle with a 2-0 Novafil suture and along the inguinal ligament with a running 2-0 Novafil suture.  Mesh was split to accommodate the cord structures.  The superior margin of the mesh was secured to the transversalis and internal oblique musculature with interrupted 2-0 Novafil sutures.  The tails of the mesh were overlapped lateral to the cord structures and secured to the inguinal ligament with interrupted 2-0 Novafil sutures to recreate the internal inguinal ring.  Cord structures were returned to the inguinal canal.  Local anesthetic was infiltrated throughout the field.  External oblique fascia was closed with interrupted 3-0 Vicryl sutures.  Subcutaneous tissues were closed with interrupted 3-0 Vicryl sutures.  Skin was anesthetized with local anesthetic, and the skin edges were re-approximated with a running 4-0 Monocryl suture.  Wound was washed and dried and steristrips were applied.  A dry gauze dressing was applied.  Instrument, sponge, and needle counts were correct prior to closure and at the conclusion of the case.  The patient tolerated the procedure well.  The patient was awakened from anesthesia and brought to the recovery room in  stable condition.  Velora Heckler, MD, [facility] Office: [phone]

## 2016-02-06 NOTE — Anesthesia Preprocedure Evaluation (Addendum)
Anesthesia Evaluation  Patient identified by MRN, date of birth, ID band Patient awake    Reviewed: Allergy & Precautions, H&P , Patient's Chart, lab work & pertinent test results, reviewed documented beta blocker date and time   Airway Mallampati: II  TM Distance: >3 FB Neck ROM: full    Dental no notable dental hx. (+) Upper Dentures   Pulmonary former smoker,    Pulmonary exam normal breath sounds clear to auscultation       Cardiovascular hypertension, On Medications  Rhythm:regular Rate:Normal     Neuro/Psych TIA   GI/Hepatic   Endo/Other    Renal/GU      Musculoskeletal   Abdominal   Peds  Hematology   Anesthesia Other Findings 150 propofol; Miller 3....easy HTN TIA Hb 11.9 Clearing URI; feels well now; some residual forced expiratory wheezes. We'll give a pre-op nebulizer     Reproductive/Obstetrics                            Anesthesia Physical Anesthesia Plan  ASA: III  Anesthesia Plan: General   Post-op Pain Management:    Induction: Intravenous  Airway Management Planned: Oral ETT  Additional Equipment:   Intra-op Plan:   Post-operative Plan: Extubation in OR  Informed Consent: I have reviewed the patients History and Physical, chart, labs and discussed the procedure including the risks, benefits and alternatives for the proposed anesthesia with the patient or authorized representative who has indicated his/her understanding and acceptance.   Dental Advisory Given and Dental advisory given  Plan Discussed with: CRNA and Surgeon  Anesthesia Plan Comments: (  Discussed general anesthesia, including possible nausea, instrumentation of airway, sore throat,pulmonary aspiration, etc. I asked if the were any outstanding questions, or  concerns before we proceeded.)        Anesthesia Quick Evaluation

## 2016-02-06 NOTE — Anesthesia Postprocedure Evaluation (Signed)
Anesthesia Post Note  Patient: Shawn Sanchez  Procedure(s) Performed: Procedure(s) (LRB): REPAIR LEFT INGUINAL HERNIA   (Left) INSERTION OF MESH (Left)  Patient location during evaluation: PACU Anesthesia Type: General Level of consciousness: awake Pain management: satisfactory to patient Vital Signs Assessment: post-procedure vital signs reviewed and stable Respiratory status: spontaneous breathing Cardiovascular status: stable Anesthetic complications: no       Last Vitals:  Vitals:   02/06/16 1212 02/06/16 1318  BP: (!) 145/78 (!) 142/84  Pulse: 62 68  Resp: 15 18  Temp: 36.6 C 36.4 C    Last Pain:  Vitals:   02/06/16 1318  TempSrc: Oral  PainSc: 2                  Lonnette Shrode EDWARD

## 2016-02-06 NOTE — Transfer of Care (Signed)
Immediate Anesthesia Transfer of Care Note  Patient: Shawn Sanchez  Procedure(s) Performed: Procedure(s): REPAIR LEFT INGUINAL HERNIA   (Left) INSERTION OF MESH (Left)  Patient Location: PACU  Anesthesia Type:General  Level of Consciousness: sedated  Airway & Oxygen Therapy: Patient Spontanous Breathing and Patient connected to face mask oxygen  Post-op Assessment: Report given to RN and Post -op Vital signs reviewed and stable  Post vital signs: Reviewed and stable  Last Vitals:  Vitals:   02/06/16 0738  BP: 126/74  Pulse: 62  Resp: 18  Temp: 36.6 C    Last Pain:  Vitals:   02/06/16 0738  TempSrc: Oral  PainSc: 2       Patients Stated Pain Goal: 4 (02/06/16 [id number])  Complications: No apparent anesthesia complications

## 2016-04-06 DIAGNOSIS — J209 Acute bronchitis, unspecified: Secondary | ICD-10-CM | POA: Diagnosis not present

## 2016-05-16 DIAGNOSIS — H2513 Age-related nuclear cataract, bilateral: Secondary | ICD-10-CM | POA: Diagnosis not present

## 2016-05-29 DIAGNOSIS — H43813 Vitreous degeneration, bilateral: Secondary | ICD-10-CM | POA: Diagnosis not present

## 2016-05-29 DIAGNOSIS — H353131 Nonexudative age-related macular degeneration, bilateral, early dry stage: Secondary | ICD-10-CM | POA: Diagnosis not present

## 2016-05-29 DIAGNOSIS — H25813 Combined forms of age-related cataract, bilateral: Secondary | ICD-10-CM | POA: Diagnosis not present

## 2016-06-27 DIAGNOSIS — H2511 Age-related nuclear cataract, right eye: Secondary | ICD-10-CM | POA: Diagnosis not present

## 2016-07-16 ENCOUNTER — Other Ambulatory Visit: Payer: Self-pay | Admitting: Cardiology

## 2016-08-01 DIAGNOSIS — R7309 Other abnormal glucose: Secondary | ICD-10-CM | POA: Diagnosis not present

## 2016-08-01 DIAGNOSIS — Z125 Encounter for screening for malignant neoplasm of prostate: Secondary | ICD-10-CM | POA: Diagnosis not present

## 2016-08-01 DIAGNOSIS — E038 Other specified hypothyroidism: Secondary | ICD-10-CM | POA: Diagnosis not present

## 2016-08-01 DIAGNOSIS — I10 Essential (primary) hypertension: Secondary | ICD-10-CM | POA: Diagnosis not present

## 2016-08-01 DIAGNOSIS — E784 Other hyperlipidemia: Secondary | ICD-10-CM | POA: Diagnosis not present

## 2016-08-05 DIAGNOSIS — Z1212 Encounter for screening for malignant neoplasm of rectum: Secondary | ICD-10-CM | POA: Diagnosis not present

## 2016-08-08 DIAGNOSIS — E784 Other hyperlipidemia: Secondary | ICD-10-CM | POA: Diagnosis not present

## 2016-08-08 DIAGNOSIS — Z6824 Body mass index (BMI) 24.0-24.9, adult: Secondary | ICD-10-CM | POA: Diagnosis not present

## 2016-08-08 DIAGNOSIS — R0981 Nasal congestion: Secondary | ICD-10-CM | POA: Diagnosis not present

## 2016-08-08 DIAGNOSIS — I10 Essential (primary) hypertension: Secondary | ICD-10-CM | POA: Diagnosis not present

## 2016-08-08 DIAGNOSIS — I129 Hypertensive chronic kidney disease with stage 1 through stage 4 chronic kidney disease, or unspecified chronic kidney disease: Secondary | ICD-10-CM | POA: Diagnosis not present

## 2016-08-08 DIAGNOSIS — Z1389 Encounter for screening for other disorder: Secondary | ICD-10-CM | POA: Diagnosis not present

## 2016-08-08 DIAGNOSIS — M5416 Radiculopathy, lumbar region: Secondary | ICD-10-CM | POA: Diagnosis not present

## 2016-08-08 DIAGNOSIS — E038 Other specified hypothyroidism: Secondary | ICD-10-CM | POA: Diagnosis not present

## 2016-08-08 DIAGNOSIS — I2510 Atherosclerotic heart disease of native coronary artery without angina pectoris: Secondary | ICD-10-CM | POA: Diagnosis not present

## 2016-08-08 DIAGNOSIS — G4709 Other insomnia: Secondary | ICD-10-CM | POA: Diagnosis not present

## 2016-08-08 DIAGNOSIS — K4090 Unilateral inguinal hernia, without obstruction or gangrene, not specified as recurrent: Secondary | ICD-10-CM | POA: Diagnosis not present

## 2016-08-08 DIAGNOSIS — Z0000 Encounter for general adult medical examination without abnormal findings: Secondary | ICD-10-CM | POA: Diagnosis not present

## 2016-08-12 DIAGNOSIS — H2512 Age-related nuclear cataract, left eye: Secondary | ICD-10-CM | POA: Diagnosis not present

## 2016-08-15 DIAGNOSIS — H2181 Floppy iris syndrome: Secondary | ICD-10-CM | POA: Diagnosis not present

## 2016-08-15 DIAGNOSIS — H2512 Age-related nuclear cataract, left eye: Secondary | ICD-10-CM | POA: Diagnosis not present

## 2017-01-15 DIAGNOSIS — K4090 Unilateral inguinal hernia, without obstruction or gangrene, not specified as recurrent: Secondary | ICD-10-CM | POA: Insufficient documentation

## 2017-01-15 DIAGNOSIS — G459 Transient cerebral ischemic attack, unspecified: Secondary | ICD-10-CM | POA: Insufficient documentation

## 2017-01-15 DIAGNOSIS — E039 Hypothyroidism, unspecified: Secondary | ICD-10-CM | POA: Insufficient documentation

## 2017-01-15 DIAGNOSIS — F32A Depression, unspecified: Secondary | ICD-10-CM | POA: Insufficient documentation

## 2017-01-15 DIAGNOSIS — J189 Pneumonia, unspecified organism: Secondary | ICD-10-CM | POA: Insufficient documentation

## 2017-01-15 DIAGNOSIS — F0390 Unspecified dementia without behavioral disturbance: Secondary | ICD-10-CM | POA: Insufficient documentation

## 2017-01-15 DIAGNOSIS — E783 Hyperchylomicronemia: Secondary | ICD-10-CM | POA: Insufficient documentation

## 2017-01-15 DIAGNOSIS — Z8673 Personal history of transient ischemic attack (TIA), and cerebral infarction without residual deficits: Secondary | ICD-10-CM | POA: Insufficient documentation

## 2017-01-15 DIAGNOSIS — F329 Major depressive disorder, single episode, unspecified: Secondary | ICD-10-CM | POA: Insufficient documentation

## 2017-01-15 DIAGNOSIS — Z8719 Personal history of other diseases of the digestive system: Secondary | ICD-10-CM | POA: Insufficient documentation

## 2017-01-15 DIAGNOSIS — Z8709 Personal history of other diseases of the respiratory system: Secondary | ICD-10-CM | POA: Insufficient documentation

## 2017-01-15 DIAGNOSIS — G5793 Unspecified mononeuropathy of bilateral lower limbs: Secondary | ICD-10-CM | POA: Insufficient documentation

## 2017-01-15 DIAGNOSIS — M1990 Unspecified osteoarthritis, unspecified site: Secondary | ICD-10-CM | POA: Insufficient documentation

## 2017-01-22 DIAGNOSIS — M25511 Pain in right shoulder: Secondary | ICD-10-CM | POA: Diagnosis not present

## 2017-01-22 DIAGNOSIS — J189 Pneumonia, unspecified organism: Secondary | ICD-10-CM | POA: Diagnosis not present

## 2017-01-22 DIAGNOSIS — R05 Cough: Secondary | ICD-10-CM | POA: Diagnosis not present

## 2017-01-22 DIAGNOSIS — I10 Essential (primary) hypertension: Secondary | ICD-10-CM | POA: Diagnosis not present

## 2017-01-22 DIAGNOSIS — N183 Chronic kidney disease, stage 3 (moderate): Secondary | ICD-10-CM | POA: Diagnosis not present

## 2017-01-22 DIAGNOSIS — Z6824 Body mass index (BMI) 24.0-24.9, adult: Secondary | ICD-10-CM | POA: Diagnosis not present

## 2017-01-30 NOTE — Progress Notes (Deleted)
HPI: FU coronary artery disease. 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 He underwent placement of Cypher drug-eluting stent to the distal right coronary artery and the mid LAD. At that time, ejection fraction was normal. Cath 12/09 with nonobstructive CAD and patent stents. Nuclear study 7/13 showed EF 67 and normal perfusion. 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 Seen in ER 8/15 with possible TIA. Echo 8/15 showed normal LV function. MRA 8/15 showed small vessel change but no significant stenosis. Since last seen, the patient has dyspnea with more extreme activities but not with routine activities. It is relieved with rest. It is not associated with chest pain. There is no orthopnea, PND or pedal edema. There is no syncope or palpitations. There is no exertional chest pain.  Current Outpatient Medications  Medication Sig Dispense Refill  . acetaminophen (TYLENOL) 500 MG tablet Take 1,000 mg by mouth daily as needed for moderate pain.     . carvedilol (COREG) 3.125 MG tablet Take 3.125 mg by mouth 2 (two) times daily with a meal.    . cholecalciferol (VITAMIN D) 1000 UNITS tablet Take 1,000 Units by mouth daily. Reported on 07/01/2015    . clopidogrel (PLAVIX) 75 MG tablet Take 75 mg by mouth at bedtime. Patient not taking since 02/02/2016    . diphenhydrAMINE (BENADRYL) 25 mg capsule Take 25 mg by mouth daily as needed for allergies.    Marland Kitchen HYDROcodone-acetaminophen (NORCO/VICODIN) 5-325 MG tablet Take 1-2 tablets by mouth every 4 (four) hours as needed for moderate pain. 30 tablet 0  . levothyroxine (SYNTHROID, LEVOTHROID) 50 MCG tablet Take 50 mcg by mouth daily before breakfast.     . Melatonin 5 MG CAPS Take 10 mg by mouth at bedtime as needed (sleep). Reported on 07/01/2015    . omeprazole (PRILOSEC) 20 MG capsule Take 20 mg by mouth every other day.     . simvastatin (ZOCOR) 20 MG tablet take 1 tablet by mouth once daily  90 tablet 3  . Tamsulosin HCl (FLOMAX) 0.4 MG CAPS Take 0.4 mg by mouth daily.      No current facility-administered medications for this visit.      Past Medical History:  Diagnosis Date  . Anemia, unspecified   . Arthritis    likely rheumatoid  . Carpal tunnel syndrome   . Coronary atherosclerosis of unspecified type of vessel, native or graft   . Dementia   . Depression   . Esophageal reflux   . Hemorrhage of rectum and anus   . Hernia, inguinal   . History of bronchitis   . History of hiatal hernia    left inguinal   . Hyperchylomicronemia   . Hypothyroidism   . Neuropathy of both feet   . Pneumonia    in the past  . Shortness of breath dyspnea    with exertion  . TIA (transient ischemic attack)   . Unspecified disorder of kidney and ureter   . Unspecified essential hypertension     Past Surgical History:  Procedure Laterality Date  . angioplasy/stent  2006  . carpal tunnel release Bilateral   . COLONOSCOPY    . CORONARY ANGIOPLASTY     2 stents placed in 2006  . INGUINAL HERNIA REPAIR Left 02/06/2016   Procedure: REPAIR LEFT INGUINAL HERNIA  ;  Surgeon: Darnell Level, MD;  Location: [facility];  Service: General;  Laterality: Left;  . INSERTION OF MESH  Left 02/06/2016   Procedure: INSERTION OF MESH;  Surgeon: Darnell Levelodd Gerkin, MD;  Location: [facility] ORS;  Service: General;  Laterality: Left;  . lamenectomy  1965  . LUMBAR LAMINECTOMY/DECOMPRESSION MICRODISCECTOMY N/A 09/27/2015   Procedure: Laminectomy and Foraminotomy - Lumbar three-four,  Lumbar four-five;  Surgeon: Tia Alertavid S Jones, MD;  Location: [facility];  Service: Neurosurgery;  Laterality: N/A;  . UPPER GASTROINTESTINAL ENDOSCOPY      Social History   Socioeconomic History  . Marital status: Divorced    Spouse name: Not on file  . Number of children: Not on file  . Years of education: Not on file  . Highest education level: Not on file  Social Needs  . Financial resource strain: Not on file  . Food insecurity -  worry: Not on file  . Food insecurity - inability: Not on file  . Transportation needs - medical: Not on file  . Transportation needs - non-medical: Not on file  Occupational History  . Not on file  Tobacco Use  . Smoking status: Former Smoker    Types: Pipe, Cigars    Last attempt to quit: 10/10/1999    Years since quitting: 17.3  . Smokeless tobacco: Never Used  . Tobacco comment: cigars only in 2005  Substance and Sexual Activity  . Alcohol use: Yes    Comment: stopped drinking 2017 after a fall at home  . Drug use: No  . Sexual activity: Not on file  Other Topics Concern  . Not on file  Social History Narrative   ** Merged History Encounter **       Retired; divorced. Daily Caffeine use- 6-7 cups. Pt does not get regular exercise.     Family History  Problem Relation Age of Onset  . Breast cancer Mother   . Lung cancer Mother   . Diabetes Sister   . Lung cancer Father   . Coronary artery disease Father   . Hypertension Father   . Colon cancer Neg Hx     ROS: no fevers or chills, productive cough, hemoptysis, dysphasia, odynophagia, melena, hematochezia, dysuria, hematuria, rash, seizure activity, orthopnea, PND, pedal edema, claudication. Remaining systems are negative.  Physical Exam: Well-developed well-nourished in no acute distress.  Skin is warm and dry.  HEENT is normal.  Neck is supple.  Chest is clear to auscultation with normal expansion.  Cardiovascular exam is regular rate and rhythm.  Abdominal exam nontender or distended. No masses palpated. Extremities show no edema. neuro grossly intact  ECG- personally reviewed  A/P  1  Olga MillersBrian Hani Campusano, MD

## 2017-02-05 ENCOUNTER — Ambulatory Visit: Payer: Medicare Other | Admitting: Cardiology

## 2017-02-05 ENCOUNTER — Encounter: Payer: Self-pay | Admitting: *Deleted

## 2017-02-07 ENCOUNTER — Ambulatory Visit (INDEPENDENT_AMBULATORY_CARE_PROVIDER_SITE_OTHER): Payer: Medicare Other | Admitting: Orthopedic Surgery

## 2017-02-07 ENCOUNTER — Other Ambulatory Visit (INDEPENDENT_AMBULATORY_CARE_PROVIDER_SITE_OTHER): Payer: Self-pay | Admitting: Orthopedic Surgery

## 2017-02-07 ENCOUNTER — Encounter (INDEPENDENT_AMBULATORY_CARE_PROVIDER_SITE_OTHER): Payer: Self-pay | Admitting: Orthopedic Surgery

## 2017-02-07 DIAGNOSIS — M7541 Impingement syndrome of right shoulder: Secondary | ICD-10-CM

## 2017-02-07 MED ORDER — MELOXICAM 7.5 MG PO TABS: ORAL_TABLET | ORAL | 0 refills | Status: DC

## 2017-02-08 ENCOUNTER — Encounter (INDEPENDENT_AMBULATORY_CARE_PROVIDER_SITE_OTHER): Payer: Self-pay | Admitting: Orthopedic Surgery

## 2017-02-08 NOTE — Progress Notes (Signed)
Office Visit Note   Patient: Shawn Sanchez           Date of Birth: [birth year]           MRN: [id number] Visit Date: 02/07/2017 Requested by: Willis Modenarinkard, Sue, NP [address] [place], [place] [address] PCP: Creola Cornusso, John, MD  Subjective: Chief Complaint  Patient presents with  . Right Shoulder - Pain, Injury    HPI: Shawn Sanchez is a patient with right shoulder pain.  He reports pain since he slipped trying to clean up after the snowstorm.  He landed on his right side.  He is right-hand dominant.  He states that he will wake up from sleep at night patient states that he had radiographs but did not bring them with him today.  By the patient's report these were negative for fracture.  Currently getting over pneumonia.              ROS: All systems reviewed are negative as they relate to the chief complaint within the history of present illness.  Patient denies  fevers or chills.   Assessment & Plan: Visit Diagnoses:  1. Impingement syndrome of right shoulder     Plan: Impression is right shoulder pain with good rotator cuff strength and findings consistent with traumatic impingement.  We talked about injection versus oral anti-inflammatory course today.  We will plan on 2 weeks of an anti-inflammatory and if that does not help he should come back in for a subacromial injection.  Mobic 7.5 mg a day for 2 weeks is prescribed.  I will see him back in 2-3 weeks if he is not any better.  Follow-Up Instructions: Return if symptoms worsen or fail to improve.   Orders:  No orders of the defined types were placed in this encounter.  Meds ordered this encounter  Medications  . meloxicam (MOBIC) 7.5 MG tablet    Sig: 1 po qd x 2 weeks    Dispense:  14 tablet    Refill:  0      Procedures: No procedures performed   Clinical Data: No additional findings.  Objective: Vital Signs: There were no vitals taken for this visit.  Physical Exam:   Constitutional: Patient appears  well-developed HEENT:  Head: Normocephalic Eyes:EOM are normal Neck: Normal range of motion Cardiovascular: Normal rate Pulmonary/chest: Effort normal Neurologic: Patient is alert Skin: Skin is warm Psychiatric: Patient has normal mood and affect    Ortho Exam: Good cervical spine range of motion.  5 out of 5 grip EPL FPL interosseous wrist flexion wrist extension biceps triceps and deltoid strength.  Radial pulse intact bilaterally.  He has good rotator cuff strength isolated infraspinatus supraspinatus and subscap muscle testing bilaterally.  Impingement signs positive on the right negative on the left.  No discrete AC joint tenderness on the right-hand side.  No other masses lymphadenopathy or skin changes noted in the right shoulder.  Specialty Comments:  No specialty comments available.  Imaging: No results found.   PMFS History: Patient Active Problem List   Diagnosis Date Noted  . TIA (transient ischemic attack)   . Pneumonia   . Neuropathy of both feet   . Hypothyroidism   . Hyperchylomicronemia   . History of hiatal hernia   . History of bronchitis   . Hernia, inguinal   . Depression   . Dementia   . Arthritis   . Left inguinal hernia 02/05/2016  . S/P lumbar laminectomy 09/27/2015  . DEPRESSION 04/17/2009  .  CAD, NATIVE VESSEL 09/21/2008  . Disorder of kidney and ureter 04/19/2008  . ANEMIA-UNSPECIFIED 11/30/2007  . RECTAL BLEEDING 11/30/2007  . FLATULENCE 11/30/2007  . FECAL OCCULT BLOOD 11/30/2007  . HYPERLIPIDEMIA 11/25/2007  . CARPAL TUNNEL SYNDROME 11/25/2007  . Essential hypertension 11/25/2007  . CAD 11/25/2007  . GERD 11/25/2007  . COUGH, CHRONIC 11/25/2007   Past Medical History:  Diagnosis Date  . Anemia, unspecified   . Arthritis    likely rheumatoid  . Carpal tunnel syndrome   . Coronary atherosclerosis of unspecified type of vessel, native or graft   . Dementia   . Depression   . Esophageal reflux   . Hemorrhage of rectum and anus     . Hernia, inguinal   . History of bronchitis   . History of hiatal hernia    left inguinal   . Hyperchylomicronemia   . Hypothyroidism   . Neuropathy of both feet   . Pneumonia    in the past  . Shortness of breath dyspnea    with exertion  . TIA (transient ischemic attack)   . Unspecified disorder of kidney and ureter   . Unspecified essential hypertension     Family History  Problem Relation Age of Onset  . Breast cancer Mother   . Lung cancer Mother   . Diabetes Sister   . Lung cancer Father   . Coronary artery disease Father   . Hypertension Father   . Colon cancer Neg Hx     Past Surgical History:  Procedure Laterality Date  . angioplasy/stent  2006  . carpal tunnel release Bilateral   . COLONOSCOPY    . CORONARY ANGIOPLASTY     2 stents placed in 2006  . INGUINAL HERNIA REPAIR Left 02/06/2016   Procedure: REPAIR LEFT INGUINAL HERNIA  ;  Surgeon: Darnell Level, MD;  Location: [facility];  Service: General;  Laterality: Left;  . INSERTION OF MESH Left 02/06/2016   Procedure: INSERTION OF MESH;  Surgeon: Darnell Level, MD;  Location: [facility];  Service: General;  Laterality: Left;  . lamenectomy  1965  . LUMBAR LAMINECTOMY/DECOMPRESSION MICRODISCECTOMY N/A 09/27/2015   Procedure: Laminectomy and Foraminotomy - Lumbar three-four,  Lumbar four-five;  Surgeon: Tia Alert, MD;  Location: [facility];  Service: Neurosurgery;  Laterality: N/A;  . UPPER GASTROINTESTINAL ENDOSCOPY     Social History   Occupational History  . Not on file  Tobacco Use  . Smoking status: Former Smoker    Types: Pipe, Cigars    Last attempt to quit: 10/10/1999    Years since quitting: 17.3  . Smokeless tobacco: Never Used  . Tobacco comment: cigars only in 2005  Substance and Sexual Activity  . Alcohol use: Yes    Comment: stopped drinking 2017 after a fall at home  . Drug use: No  . Sexual activity: Not on file

## 2017-02-13 DIAGNOSIS — M25511 Pain in right shoulder: Secondary | ICD-10-CM | POA: Diagnosis not present

## 2017-02-13 DIAGNOSIS — I129 Hypertensive chronic kidney disease with stage 1 through stage 4 chronic kidney disease, or unspecified chronic kidney disease: Secondary | ICD-10-CM | POA: Diagnosis not present

## 2017-02-13 DIAGNOSIS — Z6824 Body mass index (BMI) 24.0-24.9, adult: Secondary | ICD-10-CM | POA: Diagnosis not present

## 2017-02-13 DIAGNOSIS — N183 Chronic kidney disease, stage 3 (moderate): Secondary | ICD-10-CM | POA: Diagnosis not present

## 2017-02-13 DIAGNOSIS — E7849 Other hyperlipidemia: Secondary | ICD-10-CM | POA: Diagnosis not present

## 2017-02-13 DIAGNOSIS — E038 Other specified hypothyroidism: Secondary | ICD-10-CM | POA: Diagnosis not present

## 2017-02-13 DIAGNOSIS — R05 Cough: Secondary | ICD-10-CM | POA: Diagnosis not present

## 2017-02-13 DIAGNOSIS — F331 Major depressive disorder, recurrent, moderate: Secondary | ICD-10-CM | POA: Diagnosis not present

## 2017-02-13 DIAGNOSIS — I2510 Atherosclerotic heart disease of native coronary artery without angina pectoris: Secondary | ICD-10-CM | POA: Diagnosis not present

## 2017-02-13 DIAGNOSIS — J189 Pneumonia, unspecified organism: Secondary | ICD-10-CM | POA: Diagnosis not present

## 2017-02-20 DIAGNOSIS — N401 Enlarged prostate with lower urinary tract symptoms: Secondary | ICD-10-CM | POA: Diagnosis not present

## 2017-02-20 DIAGNOSIS — N5201 Erectile dysfunction due to arterial insufficiency: Secondary | ICD-10-CM | POA: Diagnosis not present

## 2017-02-20 DIAGNOSIS — R972 Elevated prostate specific antigen [PSA]: Secondary | ICD-10-CM | POA: Diagnosis not present

## 2017-02-20 DIAGNOSIS — R3915 Urgency of urination: Secondary | ICD-10-CM | POA: Diagnosis not present

## 2017-04-03 ENCOUNTER — Encounter (INDEPENDENT_AMBULATORY_CARE_PROVIDER_SITE_OTHER): Payer: Self-pay | Admitting: Orthopedic Surgery

## 2017-04-03 ENCOUNTER — Ambulatory Visit (INDEPENDENT_AMBULATORY_CARE_PROVIDER_SITE_OTHER): Payer: Medicare Other | Admitting: Orthopedic Surgery

## 2017-04-03 DIAGNOSIS — M7541 Impingement syndrome of right shoulder: Secondary | ICD-10-CM | POA: Diagnosis not present

## 2017-04-03 MED ORDER — BUPIVACAINE HCL 0.5 % IJ SOLN
9.0000 mL | INTRAMUSCULAR | Status: AC | PRN
  Administered 2017-04-03: 9 mL via INTRA_ARTICULAR

## 2017-04-03 MED ORDER — LIDOCAINE HCL 1 % IJ SOLN
5.0000 mL | INTRAMUSCULAR | Status: AC | PRN
  Administered 2017-04-03: 5 mL

## 2017-04-03 MED ORDER — METHYLPREDNISOLONE ACETATE 40 MG/ML IJ SUSP
40.0000 mg | INTRAMUSCULAR | Status: AC | PRN
  Administered 2017-04-03: 40 mg via INTRA_ARTICULAR

## 2017-04-03 NOTE — Progress Notes (Signed)
Office Visit Note   Patient: Shawn Sanchez           Date of Birth: 09-Jan-1932           MRN: [id number] Visit Date: 04/03/2017 Requested by: [name], MD [address] [place], [place] [address] PCP: [name], MD  Subjective: Chief Complaint  Patient presents with  . Right Shoulder - Pain    HPI: Shawn Sanchez is a patient with continued right shoulder pain.  He had a fall last year in the snow landing on his right shoulder.  He has had significant pain but no weakness.  Reports decreased range of motion with throbbing pain which wakes him from sleep at night.  He states that the shoulder pain has not cleared up as he hoped it would.  He localizes the pain to the right deltoid region.              ROS: All systems reviewed are negative as they relate to the chief complaint within the history of present illness.  Patient denies  fevers or chills.   Assessment & Plan: Visit Diagnoses:  1. Impingement syndrome of right shoulder     Plan: Impression is right shoulder pain possible bursitis versus rotator cuff pathology.  Previous radiographs by his report were normal but I have not reviewed them.  They are not available for review.  Plan at this time is subacromial injection with 4-week return to decide for or against further imaging at that time.  Follow-Up Instructions: Return in about 4 weeks (around 05/01/2017).   Orders:  No orders of the defined types were placed in this encounter.  No orders of the defined types were placed in this encounter.     Procedures: Large Joint Inj: R subacromial bursa on 04/03/2017 12:09 PM Indications: diagnostic evaluation and pain Details: 18 G 1.5 in needle, posterior approach  Arthrogram: No  Medications: 9 mL bupivacaine 0.5 %; 40 mg methylPREDNISolone acetate 40 MG/ML; 5 mL lidocaine 1 % Outcome: tolerated well, no immediate complications Procedure, treatment alternatives, risks and benefits explained, specific risks discussed.  Consent was given by the patient. Immediately prior to procedure a time out was called to verify the correct patient, procedure, equipment, support staff and site/side marked as required. Patient was prepped and draped in the usual sterile fashion.       Clinical Data: No additional findings.  Objective: Vital Signs: There were no vitals taken for this visit.  Physical Exam:   Constitutional: Patient appears well-developed HEENT:  Head: Normocephalic Eyes:EOM are normal Neck: Normal range of motion Cardiovascular: Normal rate Pulmonary/chest: Effort normal Neurologic: Patient is alert Skin: Skin is warm Psychiatric: Patient has normal mood and affect    Ortho Exam: Orthopedic exam demonstrates pretty reasonable passive range of motion of the right shoulder with good rotator cuff strength isolated infraspinatus supination and subscap muscle testing.  No real asymmetry in strength testing right versus left.  Impingement signs positive.  No restriction of passive external rotation at 15 degrees of abduction.  Radial pulses intact.  Cervical spine range of motion full.  No masses lymph adenopathy or skin changes palpable within that shoulder girdle region.  Specialty Comments:  No specialty comments available.  Imaging: No results found.   PMFS History: Patient Active Problem List   Diagnosis Date Noted  . TIA (transient ischemic attack)   . Pneumonia   . Neuropathy of both feet   . Hypothyroidism   . Hyperchylomicronemia   .  History of hiatal hernia   . History of bronchitis   . Hernia, inguinal   . Depression   . Dementia   . Arthritis   . Left inguinal hernia 02/05/2016  . S/P lumbar laminectomy 09/27/2015  . DEPRESSION 04/17/2009  . CAD, NATIVE VESSEL 09/21/2008  . Disorder of kidney and ureter 04/19/2008  . ANEMIA-UNSPECIFIED 11/30/2007  . RECTAL BLEEDING 11/30/2007  . FLATULENCE 11/30/2007  . FECAL OCCULT BLOOD 11/30/2007  . HYPERLIPIDEMIA 11/25/2007  .  CARPAL TUNNEL SYNDROME 11/25/2007  . Essential hypertension 11/25/2007  . CAD 11/25/2007  . GERD 11/25/2007  . COUGH, CHRONIC 11/25/2007   Past Medical History:  Diagnosis Date  . Anemia, unspecified   . Arthritis    likely rheumatoid  . Carpal tunnel syndrome   . Coronary atherosclerosis of unspecified type of vessel, native or graft   . Dementia   . Depression   . Esophageal reflux   . Hemorrhage of rectum and anus   . Hernia, inguinal   . History of bronchitis   . History of hiatal hernia    left inguinal   . Hyperchylomicronemia   . Hypothyroidism   . Neuropathy of both feet   . Pneumonia    in the past  . Shortness of breath dyspnea    with exertion  . TIA (transient ischemic attack)   . Unspecified disorder of kidney and ureter   . Unspecified essential hypertension     Family History  Problem Relation Age of Onset  . Breast cancer Mother   . Lung cancer Mother   . Diabetes Sister   . Lung cancer Father   . Coronary artery disease Father   . Hypertension Father   . Colon cancer Neg Hx     Past Surgical History:  Procedure Laterality Date  . angioplasy/stent  2006  . carpal tunnel release Bilateral   . COLONOSCOPY    . CORONARY ANGIOPLASTY     2 stents placed in 2006  . INGUINAL HERNIA REPAIR Left 02/06/2016   Procedure: REPAIR LEFT INGUINAL HERNIA  ;  Surgeon: Darnell Levelodd Gerkin, MD;  Location: [facility];  Service: General;  Laterality: Left;  . INSERTION OF MESH Left 02/06/2016   Procedure: INSERTION OF MESH;  Surgeon: Darnell Levelodd Gerkin, MD;  Location: [facility];  Service: General;  Laterality: Left;  . lamenectomy  1965  . LUMBAR LAMINECTOMY/DECOMPRESSION MICRODISCECTOMY N/A 09/27/2015   Procedure: Laminectomy and Foraminotomy - Lumbar three-four,  Lumbar four-five;  Surgeon: Tia Alertavid S Jones, MD;  Location: [facility];  Service: Neurosurgery;  Laterality: N/A;  . UPPER GASTROINTESTINAL ENDOSCOPY     Social History   Occupational History  . Not on file  Tobacco Use  .  Smoking status: Former Smoker    Types: Pipe, Cigars    Last attempt to quit: 10/10/1999    Years since quitting: 17.4  . Smokeless tobacco: Never Used  . Tobacco comment: cigars only in 2005  Substance and Sexual Activity  . Alcohol use: Yes    Comment: stopped drinking 2017 after a fall at home  . Drug use: No  . Sexual activity: Not on file

## 2017-05-20 NOTE — Progress Notes (Signed)
HPI: FU coronary artery disease. 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 He underwent placement of Cypher drug-eluting stent to the distal right coronary artery and the mid LAD. At that time, ejection fraction was normal. Cath 12/09 with nonobstructive CAD and patent stents. Nuclear study 7/13 showed EF 67 and normal perfusion. 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 Seen in ER 8/15 with possible TIA. Echo 8/15 showed normal LV function. MRA 8/15 showed small vessel change but no significant stenosis. Since last seen,  patient denies dyspnea, chest pain, palpitations or syncope.  Occasional mild dizziness.  Current Outpatient Medications  Medication Sig Dispense Refill  . carvedilol (COREG) 3.125 MG tablet Take 3.125 mg by mouth 2 (two) times daily with a meal.    . cholecalciferol (VITAMIN D) 1000 UNITS tablet Take 5,000 Units by mouth daily. Reported on 07/01/2015    . clopidogrel (PLAVIX) 75 MG tablet Take 75 mg by mouth at bedtime. Patient not taking since 02/02/2016    . levothyroxine (SYNTHROID, LEVOTHROID) 50 MCG tablet Take 50 mcg by mouth daily before breakfast.     . Melatonin 5 MG CAPS Take 10 mg by mouth at bedtime as needed (sleep). Reported on 07/01/2015    . omeprazole (PRILOSEC) 20 MG capsule Take 20 mg by mouth every other day.     . simvastatin (ZOCOR) 20 MG tablet take 1 tablet by mouth once daily 90 tablet 3  . Tamsulosin HCl (FLOMAX) 0.4 MG CAPS Take 0.4 mg by mouth daily.     . traZODone (DESYREL) 50 MG tablet   0   No current facility-administered medications for this visit.      Past Medical History:  Diagnosis Date  . Anemia, unspecified   . Arthritis    likely rheumatoid  . Carpal tunnel syndrome   . Coronary atherosclerosis of unspecified type of vessel, native or graft   . Dementia   . Depression   . Esophageal reflux   . Hemorrhage of rectum and anus   . Hernia, inguinal   . History  of bronchitis   . History of hiatal hernia    left inguinal   . Hyperchylomicronemia   . Hypothyroidism   . Neuropathy of both feet   . Pneumonia    in the past  . Shortness of breath dyspnea    with exertion  . TIA (transient ischemic attack)   . Unspecified disorder of kidney and ureter   . Unspecified essential hypertension     Past Surgical History:  Procedure Laterality Date  . angioplasy/stent  2006  . carpal tunnel release Bilateral   . COLONOSCOPY    . CORONARY ANGIOPLASTY     2 stents placed in 2006  . INGUINAL HERNIA REPAIR Left 02/06/2016   Procedure: REPAIR LEFT INGUINAL HERNIA  ;  Surgeon: Darnell Level, MD;  Location: [facility];  Service: General;  Laterality: Left;  . INSERTION OF MESH Left 02/06/2016   Procedure: INSERTION OF MESH;  Surgeon: Darnell Level, MD;  Location: [facility];  Service: General;  Laterality: Left;  . lamenectomy  1965  . LUMBAR LAMINECTOMY/DECOMPRESSION MICRODISCECTOMY N/A 09/27/2015   Procedure: Laminectomy and Foraminotomy - Lumbar three-four,  Lumbar four-five;  Surgeon: Tia Alert, MD;  Location: [facility];  Service: Neurosurgery;  Laterality: N/A;  . UPPER GASTROINTESTINAL ENDOSCOPY      Social History   Socioeconomic History  . Marital status: Divorced  Spouse name: Not on file  . Number of children: Not on file  . Years of education: Not on file  . Highest education level: Not on file  Occupational History  . Not on file  Social Needs  . Financial resource strain: Not on file  . Food insecurity:    Worry: Not on file    Inability: Not on file  . Transportation needs:    Medical: Not on file    Non-medical: Not on file  Tobacco Use  . Smoking status: Former Smoker    Types: Pipe, Cigars    Last attempt to quit: 10/10/1999    Years since quitting: 17.6  . Smokeless tobacco: Never Used  . Tobacco comment: cigars only in 2005  Substance and Sexual Activity  . Alcohol use: Yes    Comment: stopped drinking 2017 after a fall at  home  . Drug use: No  . Sexual activity: Not on file  Lifestyle  . Physical activity:    Days per week: Not on file    Minutes per session: Not on file  . Stress: Not on file  Relationships  . Social connections:    Talks on phone: Not on file    Gets together: Not on file    Attends religious service: Not on file    Active member of club or organization: Not on file    Attends meetings of clubs or organizations: Not on file    Relationship status: Not on file  . Intimate partner violence:    Fear of current or ex partner: Not on file    Emotionally abused: Not on file    Physically abused: Not on file    Forced sexual activity: Not on file  Other Topics Concern  . Not on file  Social History Narrative   ** Merged History Encounter **       Retired; divorced. Daily Caffeine use- 6-7 cups. Pt does not get regular exercise.     Family History  Problem Relation Age of Onset  . Breast cancer Mother   . Lung cancer Mother   . Diabetes Sister   . Lung cancer Father   . Coronary artery disease Father   . Hypertension Father   . Colon cancer Neg Hx     ROS: no fevers or chills, productive cough, hemoptysis, dysphasia, odynophagia, melena, hematochezia, dysuria, hematuria, rash, seizure activity, orthopnea, PND, pedal edema, claudication. Remaining systems are negative.  Physical Exam: Well-developed well-nourished in no acute distress.  Skin is warm and dry.  HEENT is normal.  Neck is supple.  Chest is clear to auscultation with normal expansion.  Cardiovascular exam is regular rate and rhythm.  Abdominal exam nontender or distended. No masses palpated. Extremities show no edema. neuro grossly intact  ECG-sinus rhythm at a rate of 65.  No ST changes.  Personally reviewed  A/P  1 coronary artery disease-patient doing well with no chest pain.  Continue medical therapy including plavix and statin.  2 hypertension-blood pressure is borderline and he is describing  dizziness at times.  Discontinue Coreg and follow.  3 hyperlipidemia-continue statin.  He declines high-dose.    Olga Millers, MD

## 2017-05-22 ENCOUNTER — Ambulatory Visit (INDEPENDENT_AMBULATORY_CARE_PROVIDER_SITE_OTHER): Payer: Medicare Other | Admitting: Cardiology

## 2017-05-22 ENCOUNTER — Encounter: Payer: Self-pay | Admitting: Cardiology

## 2017-05-22 VITALS — BP 98/56 | HR 65 | Ht 68.0 in | Wt 154.0 lb

## 2017-05-22 DIAGNOSIS — I10 Essential (primary) hypertension: Secondary | ICD-10-CM

## 2017-05-22 DIAGNOSIS — E7800 Pure hypercholesterolemia, unspecified: Secondary | ICD-10-CM

## 2017-05-22 DIAGNOSIS — I2510 Atherosclerotic heart disease of native coronary artery without angina pectoris: Secondary | ICD-10-CM | POA: Diagnosis not present

## 2017-05-22 NOTE — Patient Instructions (Signed)
Medication Instructions:   STOP CARVEDILOL  Follow-Up:  Your physician wants you to follow-up in: ONE YEAR WITH DR CRENSHAW You will receive a reminder letter in the mail two months in advance. If you don't receive a letter, please call our office to schedule the follow-up appointment.   If you need a refill on your cardiac medications before your next appointment, please call your pharmacy.    

## 2017-06-11 DIAGNOSIS — Z961 Presence of intraocular lens: Secondary | ICD-10-CM | POA: Diagnosis not present

## 2017-06-18 ENCOUNTER — Encounter: Payer: Self-pay | Admitting: Family Medicine

## 2017-06-18 NOTE — Addendum Note (Signed)
Addended by: Evans Lance on: 06/18/2017 03:09 PM   Modules accepted: Orders

## 2017-08-11 DIAGNOSIS — I10 Essential (primary) hypertension: Secondary | ICD-10-CM | POA: Diagnosis not present

## 2017-08-11 DIAGNOSIS — E038 Other specified hypothyroidism: Secondary | ICD-10-CM | POA: Diagnosis not present

## 2017-08-11 DIAGNOSIS — Z125 Encounter for screening for malignant neoplasm of prostate: Secondary | ICD-10-CM | POA: Diagnosis not present

## 2017-08-11 DIAGNOSIS — E7849 Other hyperlipidemia: Secondary | ICD-10-CM | POA: Diagnosis not present

## 2017-08-11 DIAGNOSIS — R82998 Other abnormal findings in urine: Secondary | ICD-10-CM | POA: Diagnosis not present

## 2017-08-11 DIAGNOSIS — R7309 Other abnormal glucose: Secondary | ICD-10-CM | POA: Diagnosis not present

## 2017-08-18 DIAGNOSIS — Z23 Encounter for immunization: Secondary | ICD-10-CM | POA: Diagnosis not present

## 2017-08-18 DIAGNOSIS — Z0000 Encounter for general adult medical examination without abnormal findings: Secondary | ICD-10-CM | POA: Diagnosis not present

## 2017-08-18 DIAGNOSIS — I2510 Atherosclerotic heart disease of native coronary artery without angina pectoris: Secondary | ICD-10-CM | POA: Diagnosis not present

## 2017-08-18 DIAGNOSIS — N401 Enlarged prostate with lower urinary tract symptoms: Secondary | ICD-10-CM | POA: Diagnosis not present

## 2017-08-18 DIAGNOSIS — R634 Abnormal weight loss: Secondary | ICD-10-CM | POA: Diagnosis not present

## 2017-08-18 DIAGNOSIS — N183 Chronic kidney disease, stage 3 (moderate): Secondary | ICD-10-CM | POA: Diagnosis not present

## 2017-08-18 DIAGNOSIS — R413 Other amnesia: Secondary | ICD-10-CM | POA: Diagnosis not present

## 2017-08-18 DIAGNOSIS — I129 Hypertensive chronic kidney disease with stage 1 through stage 4 chronic kidney disease, or unspecified chronic kidney disease: Secondary | ICD-10-CM | POA: Diagnosis not present

## 2017-08-18 DIAGNOSIS — E7849 Other hyperlipidemia: Secondary | ICD-10-CM | POA: Diagnosis not present

## 2017-08-18 DIAGNOSIS — Z6824 Body mass index (BMI) 24.0-24.9, adult: Secondary | ICD-10-CM | POA: Diagnosis not present

## 2017-08-18 DIAGNOSIS — M25511 Pain in right shoulder: Secondary | ICD-10-CM | POA: Diagnosis not present

## 2017-08-18 DIAGNOSIS — R7309 Other abnormal glucose: Secondary | ICD-10-CM | POA: Diagnosis not present

## 2017-08-18 DIAGNOSIS — K5900 Constipation, unspecified: Secondary | ICD-10-CM | POA: Diagnosis not present

## 2017-08-26 DIAGNOSIS — Z1212 Encounter for screening for malignant neoplasm of rectum: Secondary | ICD-10-CM | POA: Diagnosis not present

## 2017-09-02 ENCOUNTER — Other Ambulatory Visit: Payer: Self-pay | Admitting: Cardiology

## 2018-01-05 ENCOUNTER — Ambulatory Visit (INDEPENDENT_AMBULATORY_CARE_PROVIDER_SITE_OTHER): Payer: Medicare Other | Admitting: Family Medicine

## 2018-01-05 ENCOUNTER — Encounter (INDEPENDENT_AMBULATORY_CARE_PROVIDER_SITE_OTHER): Payer: Self-pay | Admitting: Family Medicine

## 2018-01-05 DIAGNOSIS — M25511 Pain in right shoulder: Secondary | ICD-10-CM

## 2018-01-05 MED ORDER — LIDOCAINE HCL (PF) 1 % IJ SOLN
5.0000 mL | INTRAMUSCULAR | Status: AC | PRN
  Administered 2018-01-05: 5 mL

## 2018-01-05 MED ORDER — METHYLPREDNISOLONE ACETATE 40 MG/ML IJ SUSP
40.0000 mg | INTRAMUSCULAR | Status: AC | PRN
  Administered 2018-01-05: 40 mg via INTRA_ARTICULAR

## 2018-01-05 MED ORDER — HYDROCODONE-ACETAMINOPHEN 5-325 MG PO TABS: 1.0000 | ORAL_TABLET | Freq: Every evening | ORAL | 0 refills | Status: DC | PRN

## 2018-01-05 NOTE — Progress Notes (Signed)
Office Visit Note   Patient: Shawn Sanchez           Date of Birth: April 10, 1931           MRN: [id number] Visit Date: 01/05/2018 Requested by: Creola Cornusso, John, MD [address] [place], [place] [address] PCP: Creola Cornusso, John, MD  Subjective: Chief Complaint  Patient presents with  . Right Shoulder - Pain    Pain in mainly in the biceps area x 5 days. Decreased ROM due to pain.  Difficulty lifting objects due to pain.    HPI: He is an 84 year old right-hand-dominant male with right shoulder pain.  Symptoms started about 5 days ago, no injury.  He tried Tylenol and rest with no improvement.  Pain seems to be mostly anterior but it hurts to try to reach overhead or laterally.  In March he had pain that resolved almost completely with a subacromial injection.  He was hoping to get one again today.  He is not diabetic.  Denies any neck pain with this.              ROS: Otherwise noncontributory  Objective: Vital Signs: There were no vitals taken for this visit.  Physical Exam:  Right shoulder: Very limited active range of motion especially with overhead reach.  He has good strength with isometric internal and external rotation.  Slight pain and weakness with empty can test.  Biceps strength is intact, there is some tenderness at the long head biceps tendon of both shoulders.  No Popeye deformity.  Moderately tender in the lateral subacromial space.  Imaging: None today.  Assessment & Plan: 1.  Recurrent right shoulder pain, possibly impingement but cannot rule out partial supraspinatus tear. -Subacromial injection today.  Further imaging if symptoms persist.   Follow-Up Instructions: No follow-ups on file.      Procedures: Large Joint Inj on 01/05/2018 8:24 AM Indications: pain Details: 25 G 1.5 in needle, posterior approach  Arthrogram: No  Medications: 5 mL lidocaine (PF) 1 %; 40 mg methylPREDNISolone acetate 40 MG/ML Consent was given by the patient.      No notes on file     PMFS History: Patient Active Problem List   Diagnosis Date Noted  . TIA (transient ischemic attack)   . Pneumonia   . Neuropathy of both feet   . Hypothyroidism   . Hyperchylomicronemia   . History of hiatal hernia   . History of bronchitis   . Hernia, inguinal   . Depression   . Dementia (HCC)   . Arthritis   . Left inguinal hernia 02/05/2016  . S/P lumbar laminectomy 09/27/2015  . DEPRESSION 04/17/2009  . CAD, NATIVE VESSEL 09/21/2008  . Disorder of kidney and ureter 04/19/2008  . ANEMIA-UNSPECIFIED 11/30/2007  . RECTAL BLEEDING 11/30/2007  . FLATULENCE 11/30/2007  . FECAL OCCULT BLOOD 11/30/2007  . HYPERLIPIDEMIA 11/25/2007  . CARPAL TUNNEL SYNDROME 11/25/2007  . Essential hypertension 11/25/2007  . CAD 11/25/2007  . GERD 11/25/2007  . COUGH, CHRONIC 11/25/2007   Past Medical History:  Diagnosis Date  . Anemia, unspecified   . Arthritis    likely rheumatoid  . Carpal tunnel syndrome   . Coronary atherosclerosis of unspecified type of vessel, native or graft   . Dementia (HCC)   . Depression   . Esophageal reflux   . Hemorrhage of rectum and anus   . Hernia, inguinal   . History of bronchitis   . History of hiatal hernia    left inguinal   .  Hyperchylomicronemia   . Hypothyroidism   . Neuropathy of both feet   . Pneumonia    in the past  . Shortness of breath dyspnea    with exertion  . TIA (transient ischemic attack)   . Unspecified disorder of kidney and ureter   . Unspecified essential hypertension     Family History  Problem Relation Age of Onset  . Breast cancer Mother   . Lung cancer Mother   . Diabetes Sister   . Lung cancer Father   . Coronary artery disease Father   . Hypertension Father   . Colon cancer Neg Hx     Past Surgical History:  Procedure Laterality Date  . angioplasy/stent  2006  . carpal tunnel release Bilateral   . COLONOSCOPY    . CORONARY ANGIOPLASTY     2 stents placed in 2006  . INGUINAL HERNIA REPAIR Left  02/06/2016   Procedure: REPAIR LEFT INGUINAL HERNIA  ;  Surgeon: Darnell Level, MD;  Location: [facility];  Service: General;  Laterality: Left;  . INSERTION OF MESH Left 02/06/2016   Procedure: INSERTION OF MESH;  Surgeon: Darnell Level, MD;  Location: [facility];  Service: General;  Laterality: Left;  . lamenectomy  1965  . LUMBAR LAMINECTOMY/DECOMPRESSION MICRODISCECTOMY N/A 09/27/2015   Procedure: Laminectomy and Foraminotomy - Lumbar three-four,  Lumbar four-five;  Surgeon: Tia Alert, MD;  Location: [facility];  Service: Neurosurgery;  Laterality: N/A;  . UPPER GASTROINTESTINAL ENDOSCOPY     Social History   Occupational History  . Not on file  Tobacco Use  . Smoking status: Former Smoker    Types: Pipe, Cigars    Last attempt to quit: 10/10/1999    Years since quitting: 18.2  . Smokeless tobacco: Never Used  . Tobacco comment: cigars only in 2005  Substance and Sexual Activity  . Alcohol use: Yes    Comment: stopped drinking 2017 after a fall at home  . Drug use: No  . Sexual activity: Not on file

## 2018-01-23 ENCOUNTER — Ambulatory Visit (INDEPENDENT_AMBULATORY_CARE_PROVIDER_SITE_OTHER): Payer: Medicare Other | Admitting: Orthopedic Surgery

## 2018-01-23 DIAGNOSIS — M12811 Other specific arthropathies, not elsewhere classified, right shoulder: Secondary | ICD-10-CM | POA: Diagnosis not present

## 2018-01-23 DIAGNOSIS — G8929 Other chronic pain: Secondary | ICD-10-CM | POA: Diagnosis not present

## 2018-01-23 DIAGNOSIS — M25511 Pain in right shoulder: Secondary | ICD-10-CM

## 2018-01-25 ENCOUNTER — Encounter (INDEPENDENT_AMBULATORY_CARE_PROVIDER_SITE_OTHER): Payer: Self-pay | Admitting: Orthopedic Surgery

## 2018-01-25 NOTE — Progress Notes (Signed)
Office Visit Note   Patient: Shawn Sanchez           Date of Birth: 07/11/1931           MRN: [id number] Visit Date: 01/23/2018 Requested by: Creola Cornusso, John, MD [address] [place], [place] [id number] PCP: Creola Cornusso, John, MD  Subjective: Chief Complaint  Patient presents with  . Right Shoulder - Follow-up    HPI: Shawn Sanchez is a patient with right shoulder pain.  Reports dull pain hurts more at night.  Pain with movement.  He had a right shoulder injection on 01/05/2018 which did not help with the pain.  Pain medicines also not helpful.  MRI scan in 2017 did show mild tear of the supraspinatus and subscap on the right-hand side.  He is able to write.  The pain is keeping him awake at night.  He does live alone which presents problems with potential rehab after surgery.              ROS: All systems reviewed are negative as they relate to the chief complaint within the history of present illness.  Patient denies  fevers or chills.   Assessment & Plan: Visit Diagnoses:  1. Rotator cuff arthropathy, right   2. Chronic right shoulder pain     Plan: Impression is rotator cuff arthropathy right shoulder.  Plan MRI arthrogram to evaluate the size of that rotator cuff tear.  I think in general he has weakness and loss of function today.  Whether or not that warrants surgical intervention in this patient really remains to be determined.  We can assess the size of the rotator cuff tears and then make a decision after that visit.  Follow-Up Instructions: Return for after MRI.   Orders:  Orders Placed This Encounter  Procedures  . MR SHOULDER RIGHT WO CONTRAST   No orders of the defined types were placed in this encounter.     Procedures: No procedures performed   Clinical Data: No additional findings.  Objective: Vital Signs: There were no vitals taken for this visit.  Physical Exam:   Constitutional: Patient appears well-developed HEENT:  Head: Normocephalic Eyes:EOM are  normal Neck: Normal range of motion Cardiovascular: Normal rate Pulmonary/chest: Effort normal Neurologic: Patient is alert Skin: Skin is warm Psychiatric: Patient has normal mood and affect    Ortho Exam: Ortho exam demonstrates limited forward flexion and abduction both below 90 degrees on the right.  He does have some weakness to subscap and supraspinatus testing.  There is a lot of coarseness and grinding but no evidence of frozen shoulder.  Passively I can get him above 90 degrees of abduction and forward flexion.  No other masses lymphadenopathy or skin changes noted in that shoulder girdle region.  Neck range of motion is pretty reasonable.  Specialty Comments:  No specialty comments available.  Imaging: No results found.   PMFS History: Patient Active Problem List   Diagnosis Date Noted  . TIA (transient ischemic attack)   . Pneumonia   . Neuropathy of both feet   . Hypothyroidism   . Hyperchylomicronemia   . History of hiatal hernia   . History of bronchitis   . Hernia, inguinal   . Depression   . Dementia (HCC)   . Arthritis   . Left inguinal hernia 02/05/2016  . S/P lumbar laminectomy 09/27/2015  . DEPRESSION 04/17/2009  . CAD, NATIVE VESSEL 09/21/2008  . Disorder of kidney and ureter 04/19/2008  . ANEMIA-UNSPECIFIED 11/30/2007  .  RECTAL BLEEDING 11/30/2007  . FLATULENCE 11/30/2007  . FECAL OCCULT BLOOD 11/30/2007  . HYPERLIPIDEMIA 11/25/2007  . CARPAL TUNNEL SYNDROME 11/25/2007  . Essential hypertension 11/25/2007  . CAD 11/25/2007  . GERD 11/25/2007  . COUGH, CHRONIC 11/25/2007   Past Medical History:  Diagnosis Date  . Anemia, unspecified   . Arthritis    likely rheumatoid  . Carpal tunnel syndrome   . Coronary atherosclerosis of unspecified type of vessel, native or graft   . Dementia (HCC)   . Depression   . Esophageal reflux   . Hemorrhage of rectum and anus   . Hernia, inguinal   . History of bronchitis   . History of hiatal hernia     left inguinal   . Hyperchylomicronemia   . Hypothyroidism   . Neuropathy of both feet   . Pneumonia    in the past  . Shortness of breath dyspnea    with exertion  . TIA (transient ischemic attack)   . Unspecified disorder of kidney and ureter   . Unspecified essential hypertension     Family History  Problem Relation Age of Onset  . Breast cancer Mother   . Lung cancer Mother   . Diabetes Sister   . Lung cancer Father   . Coronary artery disease Father   . Hypertension Father   . Colon cancer Neg Hx     Past Surgical History:  Procedure Laterality Date  . angioplasy/stent  2006  . carpal tunnel release Bilateral   . COLONOSCOPY    . CORONARY ANGIOPLASTY     2 stents placed in 2006  . INGUINAL HERNIA REPAIR Left 02/06/2016   Procedure: REPAIR LEFT INGUINAL HERNIA  ;  Surgeon: Darnell Level, MD;  Location: [facility];  Service: General;  Laterality: Left;  . INSERTION OF MESH Left 02/06/2016   Procedure: INSERTION OF MESH;  Surgeon: Darnell Level, MD;  Location: [facility];  Service: General;  Laterality: Left;  . lamenectomy  1965  . LUMBAR LAMINECTOMY/DECOMPRESSION MICRODISCECTOMY N/A 09/27/2015   Procedure: Laminectomy and Foraminotomy - Lumbar three-four,  Lumbar four-five;  Surgeon: Tia Alert, MD;  Location: [facility];  Service: Neurosurgery;  Laterality: N/A;  . UPPER GASTROINTESTINAL ENDOSCOPY     Social History   Occupational History  . Not on file  Tobacco Use  . Smoking status: Former Smoker    Types: Pipe, Cigars    Last attempt to quit: 10/10/1999    Years since quitting: 18.3  . Smokeless tobacco: Never Used  . Tobacco comment: cigars only in 2005  Substance and Sexual Activity  . Alcohol use: Yes    Comment: stopped drinking 2017 after a fall at home  . Drug use: No  . Sexual activity: Not on file

## 2018-01-26 ENCOUNTER — Ambulatory Visit
Admission: RE | Admit: 2018-01-26 | Discharge: 2018-01-26 | Disposition: A | Discharge status: Home / Self Care | Payer: Medicare Other | Source: Ambulatory Visit | Attending: Orthopedic Surgery | Admitting: Orthopedic Surgery

## 2018-01-26 DIAGNOSIS — M25511 Pain in right shoulder: Principal | ICD-10-CM

## 2018-01-26 DIAGNOSIS — S46119A Strain of muscle, fascia and tendon of long head of biceps, unspecified arm, initial encounter: Secondary | ICD-10-CM | POA: Diagnosis not present

## 2018-01-26 DIAGNOSIS — G8929 Other chronic pain: Secondary | ICD-10-CM

## 2018-01-27 ENCOUNTER — Ambulatory Visit (INDEPENDENT_AMBULATORY_CARE_PROVIDER_SITE_OTHER): Payer: Medicare Other | Admitting: Family Medicine

## 2018-02-16 DIAGNOSIS — I129 Hypertensive chronic kidney disease with stage 1 through stage 4 chronic kidney disease, or unspecified chronic kidney disease: Secondary | ICD-10-CM | POA: Diagnosis not present

## 2018-02-16 DIAGNOSIS — N183 Chronic kidney disease, stage 3 (moderate): Secondary | ICD-10-CM | POA: Diagnosis not present

## 2018-02-16 DIAGNOSIS — Z6824 Body mass index (BMI) 24.0-24.9, adult: Secondary | ICD-10-CM | POA: Diagnosis not present

## 2018-02-16 DIAGNOSIS — M25511 Pain in right shoulder: Secondary | ICD-10-CM | POA: Diagnosis not present

## 2018-02-16 DIAGNOSIS — I2510 Atherosclerotic heart disease of native coronary artery without angina pectoris: Secondary | ICD-10-CM | POA: Diagnosis not present

## 2018-02-16 DIAGNOSIS — R634 Abnormal weight loss: Secondary | ICD-10-CM | POA: Diagnosis not present

## 2018-02-16 DIAGNOSIS — E038 Other specified hypothyroidism: Secondary | ICD-10-CM | POA: Diagnosis not present

## 2018-02-16 DIAGNOSIS — Z1339 Encounter for screening examination for other mental health and behavioral disorders: Secondary | ICD-10-CM | POA: Diagnosis not present

## 2018-03-04 ENCOUNTER — Encounter (INDEPENDENT_AMBULATORY_CARE_PROVIDER_SITE_OTHER): Payer: Self-pay | Admitting: Orthopedic Surgery

## 2018-03-04 ENCOUNTER — Ambulatory Visit (INDEPENDENT_AMBULATORY_CARE_PROVIDER_SITE_OTHER): Payer: Medicare Other | Admitting: Orthopedic Surgery

## 2018-03-04 DIAGNOSIS — M75121 Complete rotator cuff tear or rupture of right shoulder, not specified as traumatic: Secondary | ICD-10-CM | POA: Diagnosis not present

## 2018-03-04 DIAGNOSIS — G8929 Other chronic pain: Secondary | ICD-10-CM

## 2018-03-04 DIAGNOSIS — M25511 Pain in right shoulder: Secondary | ICD-10-CM | POA: Diagnosis not present

## 2018-03-04 NOTE — Progress Notes (Signed)
Office Visit Note   Patient: Shawn Sanchez           Date of Birth: August 16, 1931           MRN: [id number] Visit Date: 03/04/2018 Requested by: [name], MD [address] [place], [place] [address] PCP: [name], MD  Subjective: Chief Complaint  Patient presents with  . Right Shoulder - Follow-up    HPI: Shawn Sanchez is an 85 year old patient with right shoulder pain.  Since have seen him he had an MRI scan.  That scan is reviewed and the patient has about a 1/2 cm supraspinatus tear with torn biceps.  Also has severe tendinosis in the infraspinatus.  Does not do any thing to physical.  He states the pain is not constant but he does notice it with some activities.              ROS: All systems reviewed are negative as they relate to the chief complaint within the history of present illness.  Patient denies  fevers or chills.   Assessment & Plan: Visit Diagnoses:  1. Chronic right shoulder pain   2. Nontraumatic complete tear of right rotator cuff     Plan: Impression is right shoulder pain with rotator cuff tear and biceps tendon problem.  He is currently making it reasonably well with his symptoms.  I would favor observation for now.  Otherwise he will need to undergo surgical fixation which may or may not heal and the patient and his age group.  He is elected to do observation for now.  I think the biceps tendon does not have deformity yet but if that completely ruptures then he could have some pain relief with that automatic tenotomy.  I will see him back as needed.  I do not think he really wants to go through surgical treatment and rehab for this problem yet because he does not have quite enough functional disability for that.  Follow-Up Instructions: Return if symptoms worsen or fail to improve.   Orders:  No orders of the defined types were placed in this encounter.  No orders of the defined types were placed in this encounter.     Procedures: No procedures  performed   Clinical Data: No additional findings.  Objective: Vital Signs: There were no vitals taken for this visit.  Physical Exam:   Constitutional: Patient appears well-developed HEENT:  Head: Normocephalic Eyes:EOM are normal Neck: Normal range of motion Cardiovascular: Normal rate Pulmonary/chest: Effort normal Neurologic: Patient is alert Skin: Skin is warm Psychiatric: Patient has normal mood and affect    Ortho Exam: Ortho exam demonstrates good cervical spine range of motion.  Does have some pain with abduction but no restriction of external rotation of 15 degrees of abduction.  He is got pretty good infraspinatus and subscap strength.  Supraspinatus strength is little bit painful to test.  Mild but not severe coarse grinding and crepitus with internal and external rotation of that leg.  Specialty Comments:  No specialty comments available.  Imaging: No results found.   PMFS History: Patient Active Problem List   Diagnosis Date Noted  . TIA (transient ischemic attack)   . Pneumonia   . Neuropathy of both feet   . Hypothyroidism   . Hyperchylomicronemia   . History of hiatal hernia   . History of bronchitis   . Hernia, inguinal   . Depression   . Dementia (HCC)   . Arthritis   . Left inguinal hernia 02/05/2016  .  S/P lumbar laminectomy 09/27/2015  . DEPRESSION 04/17/2009  . CAD, NATIVE VESSEL 09/21/2008  . Disorder of kidney and ureter 04/19/2008  . ANEMIA-UNSPECIFIED 11/30/2007  . RECTAL BLEEDING 11/30/2007  . FLATULENCE 11/30/2007  . FECAL OCCULT BLOOD 11/30/2007  . HYPERLIPIDEMIA 11/25/2007  . CARPAL TUNNEL SYNDROME 11/25/2007  . Essential hypertension 11/25/2007  . CAD 11/25/2007  . GERD 11/25/2007  . COUGH, CHRONIC 11/25/2007   Past Medical History:  Diagnosis Date  . Anemia, unspecified   . Arthritis    likely rheumatoid  . Carpal tunnel syndrome   . Coronary atherosclerosis of unspecified type of vessel, native or graft   .  Dementia (HCC)   . Depression   . Esophageal reflux   . Hemorrhage of rectum and anus   . Hernia, inguinal   . History of bronchitis   . History of hiatal hernia    left inguinal   . Hyperchylomicronemia   . Hypothyroidism   . Neuropathy of both feet   . Pneumonia    in the past  . Shortness of breath dyspnea    with exertion  . TIA (transient ischemic attack)   . Unspecified disorder of kidney and ureter   . Unspecified essential hypertension     Family History  Problem Relation Age of Onset  . Breast cancer Mother   . Lung cancer Mother   . Diabetes Sister   . Lung cancer Father   . Coronary artery disease Father   . Hypertension Father   . Colon cancer Neg Hx     Past Surgical History:  Procedure Laterality Date  . angioplasy/stent  2006  . carpal tunnel release Bilateral   . COLONOSCOPY    . CORONARY ANGIOPLASTY     2 stents placed in 2006  . INGUINAL HERNIA REPAIR Left 02/06/2016   Procedure: REPAIR LEFT INGUINAL HERNIA  ;  Surgeon: Darnell Level, MD;  Location: [facility];  Service: General;  Laterality: Left;  . INSERTION OF MESH Left 02/06/2016   Procedure: INSERTION OF MESH;  Surgeon: Darnell Level, MD;  Location: [facility];  Service: General;  Laterality: Left;  . lamenectomy  1965  . LUMBAR LAMINECTOMY/DECOMPRESSION MICRODISCECTOMY N/A 09/27/2015   Procedure: Laminectomy and Foraminotomy - Lumbar three-four,  Lumbar four-five;  Surgeon: Tia Alert, MD;  Location: [facility];  Service: Neurosurgery;  Laterality: N/A;  . UPPER GASTROINTESTINAL ENDOSCOPY     Social History   Occupational History  . Not on file  Tobacco Use  . Smoking status: Former Smoker    Types: Pipe, Cigars    Last attempt to quit: 10/10/1999    Years since quitting: 18.4  . Smokeless tobacco: Never Used  . Tobacco comment: cigars only in 2005  Substance and Sexual Activity  . Alcohol use: Yes    Comment: stopped drinking 2017 after a fall at home  . Drug use: No  . Sexual activity: Not on  file

## 2018-08-14 DIAGNOSIS — E038 Other specified hypothyroidism: Secondary | ICD-10-CM | POA: Diagnosis not present

## 2018-08-14 DIAGNOSIS — E7849 Other hyperlipidemia: Secondary | ICD-10-CM | POA: Diagnosis not present

## 2018-08-14 DIAGNOSIS — R739 Hyperglycemia, unspecified: Secondary | ICD-10-CM | POA: Diagnosis not present

## 2018-08-14 DIAGNOSIS — Z125 Encounter for screening for malignant neoplasm of prostate: Secondary | ICD-10-CM | POA: Diagnosis not present

## 2018-08-14 DIAGNOSIS — R82998 Other abnormal findings in urine: Secondary | ICD-10-CM | POA: Diagnosis not present

## 2018-08-21 DIAGNOSIS — E785 Hyperlipidemia, unspecified: Secondary | ICD-10-CM | POA: Diagnosis not present

## 2018-08-21 DIAGNOSIS — I129 Hypertensive chronic kidney disease with stage 1 through stage 4 chronic kidney disease, or unspecified chronic kidney disease: Secondary | ICD-10-CM | POA: Diagnosis not present

## 2018-08-21 DIAGNOSIS — N183 Chronic kidney disease, stage 3 (moderate): Secondary | ICD-10-CM | POA: Diagnosis not present

## 2018-08-21 DIAGNOSIS — I2510 Atherosclerotic heart disease of native coronary artery without angina pectoris: Secondary | ICD-10-CM | POA: Diagnosis not present

## 2018-08-21 DIAGNOSIS — R413 Other amnesia: Secondary | ICD-10-CM | POA: Diagnosis not present

## 2018-08-21 DIAGNOSIS — I252 Old myocardial infarction: Secondary | ICD-10-CM | POA: Diagnosis not present

## 2018-08-21 DIAGNOSIS — Z0000 Encounter for general adult medical examination without abnormal findings: Secondary | ICD-10-CM | POA: Diagnosis not present

## 2018-08-21 DIAGNOSIS — N401 Enlarged prostate with lower urinary tract symptoms: Secondary | ICD-10-CM | POA: Diagnosis not present

## 2018-08-21 DIAGNOSIS — K5900 Constipation, unspecified: Secondary | ICD-10-CM | POA: Diagnosis not present

## 2018-08-21 DIAGNOSIS — M25511 Pain in right shoulder: Secondary | ICD-10-CM | POA: Diagnosis not present

## 2018-08-21 DIAGNOSIS — R739 Hyperglycemia, unspecified: Secondary | ICD-10-CM | POA: Diagnosis not present

## 2018-08-21 DIAGNOSIS — R634 Abnormal weight loss: Secondary | ICD-10-CM | POA: Diagnosis not present

## 2018-10-23 ENCOUNTER — Other Ambulatory Visit: Payer: Self-pay | Admitting: Cardiology

## 2018-10-23 ENCOUNTER — Other Ambulatory Visit: Payer: Self-pay

## 2018-10-23 MED ORDER — SIMVASTATIN 20 MG PO TABS: 20.0000 mg | ORAL_TABLET | Freq: Every day | ORAL | 3 refills | Status: DC

## 2018-10-23 NOTE — Telephone Encounter (Signed)
°*  STAT* If patient is at the pharmacy, call can be transferred to refill team.   1. Which medications need to be refilled? (please list name of each medication and dose if known) simvastatin (ZOCOR) 20 MG tablet  2. Which pharmacy/location (including street and city if local pharmacy) is medication to be sent to? [facility] [phone] - [place], [place]  3. Do they need a 30 day or 90 day supply? 90 day

## 2018-10-26 MED ORDER — SIMVASTATIN 20 MG PO TABS: 20.0000 mg | ORAL_TABLET | Freq: Every day | ORAL | 0 refills | Status: DC

## 2018-10-26 NOTE — Telephone Encounter (Signed)
Rx(s) sent to pharmacy electronically.  

## 2018-11-27 ENCOUNTER — Other Ambulatory Visit: Payer: Self-pay

## 2018-11-27 DIAGNOSIS — Z20822 Contact with and (suspected) exposure to covid-19: Secondary | ICD-10-CM

## 2018-11-27 DIAGNOSIS — Z20828 Contact with and (suspected) exposure to other viral communicable diseases: Secondary | ICD-10-CM | POA: Diagnosis not present

## 2018-11-28 LAB — NOVEL CORONAVIRUS, NAA: SARS-CoV-2, NAA: NOT DETECTED

## 2019-02-26 DIAGNOSIS — R634 Abnormal weight loss: Secondary | ICD-10-CM | POA: Diagnosis not present

## 2019-02-26 DIAGNOSIS — N3941 Urge incontinence: Secondary | ICD-10-CM | POA: Diagnosis not present

## 2019-02-26 DIAGNOSIS — I129 Hypertensive chronic kidney disease with stage 1 through stage 4 chronic kidney disease, or unspecified chronic kidney disease: Secondary | ICD-10-CM | POA: Diagnosis not present

## 2019-02-26 DIAGNOSIS — Z1331 Encounter for screening for depression: Secondary | ICD-10-CM | POA: Diagnosis not present

## 2019-02-26 DIAGNOSIS — I2510 Atherosclerotic heart disease of native coronary artery without angina pectoris: Secondary | ICD-10-CM | POA: Diagnosis not present

## 2019-02-26 DIAGNOSIS — N1831 Chronic kidney disease, stage 3a: Secondary | ICD-10-CM | POA: Diagnosis not present

## 2019-02-26 DIAGNOSIS — R413 Other amnesia: Secondary | ICD-10-CM | POA: Diagnosis not present

## 2019-02-26 DIAGNOSIS — E039 Hypothyroidism, unspecified: Secondary | ICD-10-CM | POA: Diagnosis not present

## 2019-02-26 DIAGNOSIS — F331 Major depressive disorder, recurrent, moderate: Secondary | ICD-10-CM | POA: Diagnosis not present

## 2019-02-26 DIAGNOSIS — N401 Enlarged prostate with lower urinary tract symptoms: Secondary | ICD-10-CM | POA: Diagnosis not present

## 2019-02-26 DIAGNOSIS — L309 Dermatitis, unspecified: Secondary | ICD-10-CM | POA: Diagnosis not present

## 2019-03-26 DIAGNOSIS — Z23 Encounter for immunization: Secondary | ICD-10-CM | POA: Diagnosis not present

## 2019-09-02 DIAGNOSIS — R82998 Other abnormal findings in urine: Secondary | ICD-10-CM | POA: Diagnosis not present

## 2019-09-02 DIAGNOSIS — R42 Dizziness and giddiness: Secondary | ICD-10-CM | POA: Diagnosis not present

## 2019-09-02 DIAGNOSIS — N401 Enlarged prostate with lower urinary tract symptoms: Secondary | ICD-10-CM | POA: Diagnosis not present

## 2019-09-02 DIAGNOSIS — R634 Abnormal weight loss: Secondary | ICD-10-CM | POA: Diagnosis not present

## 2019-09-02 DIAGNOSIS — K056 Periodontal disease, unspecified: Secondary | ICD-10-CM | POA: Diagnosis not present

## 2019-09-02 DIAGNOSIS — N183 Chronic kidney disease, stage 3 unspecified: Secondary | ICD-10-CM | POA: Diagnosis not present

## 2019-09-02 DIAGNOSIS — R8290 Unspecified abnormal findings in urine: Secondary | ICD-10-CM | POA: Diagnosis not present

## 2019-09-02 DIAGNOSIS — R413 Other amnesia: Secondary | ICD-10-CM | POA: Diagnosis not present

## 2019-09-02 DIAGNOSIS — R809 Proteinuria, unspecified: Secondary | ICD-10-CM | POA: Diagnosis not present

## 2019-09-02 DIAGNOSIS — I2510 Atherosclerotic heart disease of native coronary artery without angina pectoris: Secondary | ICD-10-CM | POA: Diagnosis not present

## 2019-09-02 DIAGNOSIS — F331 Major depressive disorder, recurrent, moderate: Secondary | ICD-10-CM | POA: Diagnosis not present

## 2019-09-02 DIAGNOSIS — R739 Hyperglycemia, unspecified: Secondary | ICD-10-CM | POA: Diagnosis not present

## 2019-09-02 DIAGNOSIS — M5416 Radiculopathy, lumbar region: Secondary | ICD-10-CM | POA: Diagnosis not present

## 2019-09-02 DIAGNOSIS — I129 Hypertensive chronic kidney disease with stage 1 through stage 4 chronic kidney disease, or unspecified chronic kidney disease: Secondary | ICD-10-CM | POA: Diagnosis not present

## 2019-09-02 DIAGNOSIS — E785 Hyperlipidemia, unspecified: Secondary | ICD-10-CM | POA: Diagnosis not present

## 2019-09-02 DIAGNOSIS — G4700 Insomnia, unspecified: Secondary | ICD-10-CM | POA: Diagnosis not present

## 2019-09-02 DIAGNOSIS — R269 Unspecified abnormalities of gait and mobility: Secondary | ICD-10-CM | POA: Diagnosis not present

## 2019-09-02 DIAGNOSIS — N3941 Urge incontinence: Secondary | ICD-10-CM | POA: Diagnosis not present

## 2019-09-02 DIAGNOSIS — Z0000 Encounter for general adult medical examination without abnormal findings: Secondary | ICD-10-CM | POA: Diagnosis not present

## 2019-09-02 DIAGNOSIS — E039 Hypothyroidism, unspecified: Secondary | ICD-10-CM | POA: Diagnosis not present

## 2019-10-14 DIAGNOSIS — L89312 Pressure ulcer of right buttock, stage 2: Secondary | ICD-10-CM | POA: Diagnosis not present

## 2019-12-03 ENCOUNTER — Other Ambulatory Visit: Payer: Self-pay | Admitting: Cardiology

## 2020-01-03 DIAGNOSIS — H353131 Nonexudative age-related macular degeneration, bilateral, early dry stage: Secondary | ICD-10-CM | POA: Diagnosis not present

## 2020-01-24 DIAGNOSIS — H26491 Other secondary cataract, right eye: Secondary | ICD-10-CM | POA: Diagnosis not present

## 2020-02-28 ENCOUNTER — Other Ambulatory Visit: Payer: Self-pay | Admitting: Cardiology

## 2020-03-20 ENCOUNTER — Emergency Department (HOSPITAL_COMMUNITY)
Admission: EM | Admit: 2020-03-20 | Discharge: 2020-03-20 | Disposition: A | Discharge status: Home / Self Care | Payer: Medicare Other | Attending: Emergency Medicine | Admitting: Emergency Medicine

## 2020-03-20 ENCOUNTER — Encounter (HOSPITAL_COMMUNITY): Payer: Self-pay | Admitting: Emergency Medicine

## 2020-03-20 ENCOUNTER — Emergency Department (HOSPITAL_COMMUNITY): Payer: Medicare Other

## 2020-03-20 DIAGNOSIS — E039 Hypothyroidism, unspecified: Secondary | ICD-10-CM | POA: Diagnosis not present

## 2020-03-20 DIAGNOSIS — F0390 Unspecified dementia without behavioral disturbance: Secondary | ICD-10-CM | POA: Insufficient documentation

## 2020-03-20 DIAGNOSIS — Y9389 Activity, other specified: Secondary | ICD-10-CM | POA: Insufficient documentation

## 2020-03-20 DIAGNOSIS — Z955 Presence of coronary angioplasty implant and graft: Secondary | ICD-10-CM | POA: Diagnosis not present

## 2020-03-20 DIAGNOSIS — Z7902 Long term (current) use of antithrombotics/antiplatelets: Secondary | ICD-10-CM | POA: Diagnosis not present

## 2020-03-20 DIAGNOSIS — Z79899 Other long term (current) drug therapy: Secondary | ICD-10-CM | POA: Insufficient documentation

## 2020-03-20 DIAGNOSIS — R42 Dizziness and giddiness: Secondary | ICD-10-CM | POA: Diagnosis not present

## 2020-03-20 DIAGNOSIS — I2510 Atherosclerotic heart disease of native coronary artery without angina pectoris: Secondary | ICD-10-CM | POA: Diagnosis not present

## 2020-03-20 DIAGNOSIS — W01198A Fall on same level from slipping, tripping and stumbling with subsequent striking against other object, initial encounter: Secondary | ICD-10-CM | POA: Diagnosis not present

## 2020-03-20 DIAGNOSIS — Z87891 Personal history of nicotine dependence: Secondary | ICD-10-CM | POA: Diagnosis not present

## 2020-03-20 LAB — URINALYSIS, ROUTINE W REFLEX MICROSCOPIC
Bilirubin Urine: NEGATIVE
Glucose, UA: NEGATIVE mg/dL
Hgb urine dipstick: NEGATIVE
Ketones, ur: NEGATIVE mg/dL
Leukocytes,Ua: NEGATIVE
Nitrite: NEGATIVE
Protein, ur: NEGATIVE mg/dL
Specific Gravity, Urine: 1.017 (ref 1.005–1.030)
pH: 5 (ref 5.0–8.0)

## 2020-03-20 LAB — CBC
HCT: 42.9 % (ref 39.0–52.0)
Hemoglobin: 14.1 g/dL (ref 13.0–17.0)
MCH: 34.1 pg — ABNORMAL HIGH (ref 26.0–34.0)
MCHC: 32.9 g/dL (ref 30.0–36.0)
MCV: 103.9 fL — ABNORMAL HIGH (ref 80.0–100.0)
Platelets: 198 10*3/uL (ref 150–400)
RBC: 4.13 MIL/uL — ABNORMAL LOW (ref 4.22–5.81)
RDW: 12.2 % (ref 11.5–15.5)
WBC: 7.7 10*3/uL (ref 4.0–10.5)
nRBC: 0 % (ref 0.0–0.2)

## 2020-03-20 LAB — BASIC METABOLIC PANEL
Anion gap: 8 (ref 5–15)
BUN: 23 mg/dL (ref 8–23)
CO2: 28 mmol/L (ref 22–32)
Calcium: 9.4 mg/dL (ref 8.9–10.3)
Chloride: 104 mmol/L (ref 98–111)
Creatinine, Ser: 1.31 mg/dL — ABNORMAL HIGH (ref 0.61–1.24)
GFR, Estimated: 53 mL/min — ABNORMAL LOW (ref >60)
Glucose, Bld: 118 mg/dL — ABNORMAL HIGH (ref 70–99)
Potassium: 4.1 mmol/L (ref 3.5–5.1)
Sodium: 140 mmol/L (ref 135–145)

## 2020-03-20 MED ORDER — MECLIZINE HCL 25 MG PO TABS
25.0000 mg | ORAL_TABLET | Freq: Once | ORAL | Status: AC
  Administered 2020-03-20: 25 mg via ORAL
  Filled 2020-03-20: qty 1

## 2020-03-20 MED ORDER — MECLIZINE HCL 25 MG PO TABS: 25.0000 mg | ORAL_TABLET | Freq: Three times a day (TID) | ORAL | 0 refills | Status: AC | PRN

## 2020-03-20 NOTE — ED Triage Notes (Signed)
Pt report had a fall 2 days ago when he bent over. Pt denies LOC with fall. Pt reports when he turns his head has dizziness. No droop, drift, slurred speech. VAN neg.

## 2020-03-20 NOTE — ED Provider Notes (Signed)
[facility] EMERGENCY DEPARTMENT Provider Note   CSN: [id number] Arrival date & time: 03/20/20  1553     History Chief Complaint  Patient presents with  . Fall  . Dizziness    Shawn Sanchez is a 87 y.o. male w/ h/o HTN, HLD, CAD, dementia, previous TIA, hypothyroidism, and depression who presents to the ED for dizziness. Patient states he bent over the pick up his cat's feeding bowl 2 days ago when he became dizzy and fell forward, striking the top of his head against the cabinet. Denies LOC. Intermittent episodes of dizziness since that time. Dizziness described as if the room is spinning. Episodes come on suddenly when he changes positions in bed, stands up too quickly, or bends over. Symptoms last for several minutes and improve with remaining still. Previous h/o vertigo that improved with meclizine and resolved on its down. Denies fever, chills, headache, vision changes, hearing loss, N/V/D, neck pain, chest pain, SOB, palpitations, numbness, weakness, or trouble walking. On Plavix.  The history is provided by the patient and medical records.  Dizziness Quality:  Room spinning and vertigo Severity:  Moderate Onset quality:  Sudden Duration:  2 days Timing:  Intermittent Progression:  Unchanged Chronicity:  Recurrent Context: bending over, head movement and standing up   Context: not with loss of consciousness   Relieved by:  Being still Worsened by:  Standing up and turning head Ineffective treatments:  None tried Associated symptoms: no blood in stool, no chest pain, no diarrhea, no headaches, no hearing loss, no nausea, no palpitations, no shortness of breath, no syncope, no tinnitus, no vision changes, no vomiting and no weakness   Risk factors: hx of vertigo   Risk factors: no new medications        Past Medical History:  Diagnosis Date  . Anemia, unspecified   . Arthritis    likely rheumatoid  . Carpal tunnel syndrome   . Coronary  atherosclerosis of unspecified type of vessel, native or graft   . Dementia (HCC)   . Depression   . Esophageal reflux   . Hemorrhage of rectum and anus   . Hernia, inguinal   . History of bronchitis   . History of hiatal hernia    left inguinal   . Hyperchylomicronemia   . Hypothyroidism   . Neuropathy of both feet   . Pneumonia    in the past  . Shortness of breath dyspnea    with exertion  . TIA (transient ischemic attack)   . Unspecified disorder of kidney and ureter   . Unspecified essential hypertension     Patient Active Problem List   Diagnosis Date Noted  . TIA (transient ischemic attack)   . Pneumonia   . Neuropathy of both feet   . Hypothyroidism   . Hyperchylomicronemia   . History of hiatal hernia   . History of bronchitis   . Hernia, inguinal   . Depression   . Dementia (HCC)   . Arthritis   . Left inguinal hernia 02/05/2016  . S/P lumbar laminectomy 09/27/2015  . DEPRESSION 04/17/2009  . CAD, NATIVE VESSEL 09/21/2008  . Disorder of kidney and ureter 04/19/2008  . ANEMIA-UNSPECIFIED 11/30/2007  . RECTAL BLEEDING 11/30/2007  . FLATULENCE 11/30/2007  . FECAL OCCULT BLOOD 11/30/2007  . HYPERLIPIDEMIA 11/25/2007  . CARPAL TUNNEL SYNDROME 11/25/2007  . Essential hypertension 11/25/2007  . CAD 11/25/2007  . GERD 11/25/2007  . COUGH, CHRONIC 11/25/2007    Past Surgical History:  Procedure Laterality Date  . angioplasy/stent  2006  . carpal tunnel release Bilateral   . COLONOSCOPY    . CORONARY ANGIOPLASTY     2 stents placed in 2006  . INGUINAL HERNIA REPAIR Left 02/06/2016   Procedure: REPAIR LEFT INGUINAL HERNIA  ;  Surgeon: Darnell Level, MD;  Location: [facility];  Service: General;  Laterality: Left;  . INSERTION OF MESH Left 02/06/2016   Procedure: INSERTION OF MESH;  Surgeon: Darnell Level, MD;  Location: [facility];  Service: General;  Laterality: Left;  . lamenectomy  1965  . LUMBAR LAMINECTOMY/DECOMPRESSION MICRODISCECTOMY N/A 09/27/2015   Procedure:  Laminectomy and Foraminotomy - Lumbar three-four,  Lumbar four-five;  Surgeon: Tia Alert, MD;  Location: [facility];  Service: Neurosurgery;  Laterality: N/A;  . UPPER GASTROINTESTINAL ENDOSCOPY         Family History  Problem Relation Age of Onset  . Breast cancer Mother   . Lung cancer Mother   . Diabetes Sister   . Lung cancer Father   . Coronary artery disease Father   . Hypertension Father   . Colon cancer Neg Hx     Social History   Tobacco Use  . Smoking status: Former Smoker    Types: Pipe, Cigars    Quit date: 10/10/1999    Years since quitting: 20.4  . Smokeless tobacco: Never Used  . Tobacco comment: cigars only in 2005  Substance Use Topics  . Alcohol use: Yes    Comment: stopped drinking 2017 after a fall at home  . Drug use: No    Home Medications Prior to Admission medications   Medication Sig Start Date End Date Taking? Authorizing Provider  meclizine (ANTIVERT) 25 MG tablet Take 1 tablet (25 mg total) by mouth 3 (three) times daily as needed for up to 5 days for dizziness. 03/20/20 03/25/20 Yes Tonia Brooms, MD  cholecalciferol (VITAMIN D) 1000 UNITS tablet Take 5,000 Units by mouth daily. Reported on 07/01/2015    [provider]  clopidogrel (PLAVIX) 75 MG tablet Take 75 mg by mouth at bedtime. Patient not taking since 02/02/2016 09/03/13   [provider]  HYDROcodone-acetaminophen (NORCO/VICODIN) 5-325 MG tablet Take 1-2 tablets by mouth at bedtime as needed for moderate pain. 01/05/18   Hilts, Casimiro Needle, MD  levothyroxine (SYNTHROID, LEVOTHROID) 50 MCG tablet Take 50 mcg by mouth daily before breakfast.     [provider]  Melatonin 5 MG CAPS Take 10 mg by mouth at bedtime as needed (sleep). Reported on 07/01/2015    [provider]  omeprazole (PRILOSEC) 20 MG capsule Take 20 mg by mouth every other day.  09/25/12   [provider]  simvastatin (ZOCOR) 20 MG tablet TAKE 1 TABLET(20 MG) BY MOUTH DAILY 02/28/20    Lewayne Bunting, MD  Tamsulosin HCl (FLOMAX) 0.4 MG CAPS Take 0.4 mg by mouth daily.  07/20/11   [provider]  traZODone (DESYREL) 50 MG tablet  01/12/17   [provider]    Allergies    Epinephrine and Novocain [procaine]  Review of Systems   Review of Systems  Constitutional: Negative for chills and fever.  HENT: Negative for ear pain, hearing loss, sore throat and tinnitus.   Eyes: Negative for pain and visual disturbance.  Respiratory: Negative for cough and shortness of breath.   Cardiovascular: Negative for chest pain, palpitations and syncope.  Gastrointestinal: Negative for abdominal pain, blood in stool, diarrhea, nausea and vomiting.  Genitourinary: Negative for dysuria  and hematuria.  Musculoskeletal: Negative for arthralgias and back pain.  Skin: Negative for color change and rash.  Neurological: Positive for dizziness. Negative for seizures, syncope, weakness and headaches.  All other systems reviewed and are negative.   Physical Exam Updated Vital Signs BP (!) 147/84 (BP Location: Right Arm)   Pulse 76   Temp 98 F (36.7 C) (Oral)   Resp 18   SpO2 100%   Physical Exam Vitals and nursing note reviewed.  Constitutional:      General: He is awake. He is not in acute distress.    Appearance: Normal appearance. He is well-developed, well-groomed and well-nourished. He is not ill-appearing.  HENT:     Head: Normocephalic and atraumatic.     Right Ear: External ear normal.     Left Ear: External ear normal.     Nose: Nose normal.     Mouth/Throat:     Mouth: Mucous membranes are moist.     Pharynx: Oropharynx is clear. No oropharyngeal exudate or posterior oropharyngeal erythema.  Eyes:     General: No visual field deficit or scleral icterus.       Right eye: No discharge.        Left eye: No discharge.     Extraocular Movements: Extraocular movements intact.     Conjunctiva/sclera: Conjunctivae normal.     Pupils: Pupils are equal,  round, and reactive to light.  Cardiovascular:     Rate and Rhythm: Normal rate and regular rhythm.     Pulses: Normal pulses.     Heart sounds: Normal heart sounds. No murmur heard.   Pulmonary:     Effort: Pulmonary effort is normal. No respiratory distress.     Breath sounds: Normal breath sounds.  Musculoskeletal:        General: No edema.     Cervical back: Neck supple. No pain with movement, spinous process tenderness or muscular tenderness.     Right lower leg: No edema.     Left lower leg: No edema.  Skin:    General: Skin is warm and dry.     Findings: No rash.  Neurological:     General: No focal deficit present.     Mental Status: He is alert and oriented to person, place, and time.     GCS: GCS eye subscore is 4. GCS verbal subscore is 5. GCS motor subscore is 6.     Cranial Nerves: Cranial nerves are intact. No cranial nerve deficit, dysarthria or facial asymmetry.     Sensory: Sensation is intact. No sensory deficit.     Motor: Motor function is intact. No weakness.     Coordination: Coordination is intact. Finger-Nose-Finger Test and Heel to [place] Test normal.     Gait: Gait is intact. Gait normal.  Psychiatric:        Mood and Affect: Mood and affect normal.        Behavior: Behavior is cooperative.     ED Results / Procedures / Treatments   Labs (all labs ordered are listed, but only abnormal results are displayed) Labs Reviewed  BASIC METABOLIC PANEL - Abnormal; Notable for the following components:      Result Value   Glucose, Bld 118 (*)    Creatinine, Ser 1.31 (*)    GFR, Estimated 53 (*)    All other components within normal limits  CBC - Abnormal; Notable for the following components:   RBC 4.13 (*)    MCV 103.9 (*)  MCH 34.1 (*)    All other components within normal limits  URINALYSIS, ROUTINE W REFLEX MICROSCOPIC - Abnormal; Notable for the following components:   APPearance HAZY (*)    All other components within normal limits  CBG  MONITORING, ED    EKG EKG Interpretation  Date/Time:  Monday March 20 2020 16:05:29 EST Ventricular Rate:  79 PR Interval:  190 QRS Duration: 72 QT Interval:  376 QTC Calculation: 431 R Axis:   62 Text Interpretation: Normal sinus rhythm Normal ECG Similar to 2017 tracing Confirmed by Alona BeneLong, Joshua [phone]([id number]) on 03/20/2020 5:55:10 PM   Radiology CT HEAD WO CONTRAST  Result Date: 03/20/2020 CLINICAL DATA:  Head trauma, dizziness, fall 2 days ago. EXAM: CT HEAD WITHOUT CONTRAST TECHNIQUE: Contiguous axial images were obtained from the base of the skull through the vertex without intravenous contrast. COMPARISON:  MR head 09/06/2013 FINDINGS: Brain: Cerebral ventricle sizes are concordant with the degree of cerebral volume loss. Patchy and confluent areas of decreased attenuation are noted throughout the deep and periventricular white matter of the cerebral hemispheres bilaterally, compatible with chronic microvascular ischemic disease. No evidence of large-territorial acute infarction. No parenchymal hemorrhage. No mass lesion. No extra-axial collection. No mass effect or midline shift. No hydrocephalus. Basilar cisterns are patent. Vascular: No hyperdense vessel. Atherosclerotic calcifications are present within the cavernous internal carotid arteries. Skull: No acute fracture or focal lesion. Sinuses/Orbits: Bilateral maxillary, ethmoid, frontal sinus mucosal thickening. Left sphenoid sinus mucosal thickening. Otherwise remaining paranasal sinuses and mastoid air cells are clear. The orbits are unremarkable. Other: Left parieto-occipital scalp subcutaneus soft tissue edema with no large hematoma formation. IMPRESSION: No acute intracranial abnormality. Electronically Signed   By: Tish FredericksonMorgane  Naveau M.D.   On: 03/20/2020 17:18    Procedures Procedures  Medications Ordered in ED Medications  meclizine (ANTIVERT) tablet 25 mg (25 mg Oral Given 03/20/20 1845)    ED Course  I have reviewed the  triage vital signs and the nursing notes.  Pertinent labs & imaging results that were available during my care of the patient were reviewed by me and considered in my medical decision making (see chart for details).    MDM Rules/Calculators/A&P                          Patient is a [age]yoM with history and physical as described above who presents to the ED for dizziness and recent fall. VS reassuring and HDS. Patient standing in room on initial assessment and changing into gown unassisted without any distress or unsteadiness on his feet. Non-focal neuro exam. Patient asymptomatic during exam. Initial workup started in triage and included CBC, BMP, UA, ECG, and head CT. Initial treatment includes meclizine.  Head CT unremarkable. Labs and ECG reassuring. On reassessment, patient states he feels better with Meclizine and would like to be discharged home. Doubt central vertigo etiology including CVA/TIA, vertebrobasilar insufficiency, space occupying lesion, or vertebral artery dissection at this time. Also doubt cardiogenic etiology, anemia, or ICH. Recommend close follow up with PCP on outpatient basis. Patient states his friend who joined him in the ED already called PCP office to schedule an appointment. Strict return precautions provided and discussed. Questions and concerns addressed. Patient verbalized understanding and amenable with discharge plan. Meclizine script provided. Discharged in stable condition.  Final Clinical Impression(s) / ED Diagnoses Final diagnoses:  Vertigo    Rx / DC Orders ED Discharge Orders  Ordered    meclizine (ANTIVERT) 25 MG tablet  3 times daily PRN        03/20/20 1956           Tonia Brooms, MD 03/21/20 Annia Belt, MD 03/30/20 1015

## 2020-03-27 DIAGNOSIS — R42 Dizziness and giddiness: Secondary | ICD-10-CM | POA: Diagnosis not present

## 2020-03-27 DIAGNOSIS — R413 Other amnesia: Secondary | ICD-10-CM | POA: Diagnosis not present

## 2020-03-27 DIAGNOSIS — H8110 Benign paroxysmal vertigo, unspecified ear: Secondary | ICD-10-CM | POA: Diagnosis not present

## 2020-03-27 DIAGNOSIS — N1831 Chronic kidney disease, stage 3a: Secondary | ICD-10-CM | POA: Diagnosis not present

## 2020-03-27 DIAGNOSIS — E039 Hypothyroidism, unspecified: Secondary | ICD-10-CM | POA: Diagnosis not present

## 2020-03-27 DIAGNOSIS — F331 Major depressive disorder, recurrent, moderate: Secondary | ICD-10-CM | POA: Diagnosis not present

## 2020-03-27 DIAGNOSIS — G4700 Insomnia, unspecified: Secondary | ICD-10-CM | POA: Diagnosis not present

## 2020-03-27 DIAGNOSIS — E785 Hyperlipidemia, unspecified: Secondary | ICD-10-CM | POA: Diagnosis not present

## 2020-03-27 DIAGNOSIS — I129 Hypertensive chronic kidney disease with stage 1 through stage 4 chronic kidney disease, or unspecified chronic kidney disease: Secondary | ICD-10-CM | POA: Diagnosis not present

## 2020-03-27 DIAGNOSIS — R739 Hyperglycemia, unspecified: Secondary | ICD-10-CM | POA: Diagnosis not present

## 2020-03-27 DIAGNOSIS — I2510 Atherosclerotic heart disease of native coronary artery without angina pectoris: Secondary | ICD-10-CM | POA: Diagnosis not present

## 2020-03-27 DIAGNOSIS — M5416 Radiculopathy, lumbar region: Secondary | ICD-10-CM | POA: Diagnosis not present

## 2020-03-29 ENCOUNTER — Other Ambulatory Visit: Payer: Self-pay

## 2020-03-29 ENCOUNTER — Other Ambulatory Visit (HOSPITAL_COMMUNITY): Payer: Self-pay | Admitting: Internal Medicine

## 2020-03-29 ENCOUNTER — Ambulatory Visit (HOSPITAL_COMMUNITY)
Admission: RE | Admit: 2020-03-29 | Discharge: 2020-03-29 | Disposition: A | Discharge status: Home / Self Care | Payer: Medicare Other | Source: Ambulatory Visit | Attending: Internal Medicine | Admitting: Internal Medicine

## 2020-03-29 DIAGNOSIS — R42 Dizziness and giddiness: Secondary | ICD-10-CM

## 2020-04-20 ENCOUNTER — Emergency Department (HOSPITAL_COMMUNITY): Payer: Medicare Other

## 2020-04-20 ENCOUNTER — Ambulatory Visit (HOSPITAL_COMMUNITY)
Admission: EM | Admit: 2020-04-20 | Discharge: 2020-04-20 | Disposition: A | Discharge status: Home / Self Care | Payer: Medicare Other

## 2020-04-20 ENCOUNTER — Encounter (HOSPITAL_COMMUNITY): Payer: Self-pay | Admitting: Emergency Medicine

## 2020-04-20 ENCOUNTER — Other Ambulatory Visit: Payer: Self-pay

## 2020-04-20 ENCOUNTER — Emergency Department (HOSPITAL_COMMUNITY)
Admission: EM | Admit: 2020-04-20 | Discharge: 2020-04-21 | Disposition: A | Discharge status: Home / Self Care | Payer: Medicare Other | Attending: Emergency Medicine | Admitting: Emergency Medicine

## 2020-04-20 DIAGNOSIS — I2510 Atherosclerotic heart disease of native coronary artery without angina pectoris: Secondary | ICD-10-CM | POA: Diagnosis not present

## 2020-04-20 DIAGNOSIS — Z955 Presence of coronary angioplasty implant and graft: Secondary | ICD-10-CM | POA: Insufficient documentation

## 2020-04-20 DIAGNOSIS — E039 Hypothyroidism, unspecified: Secondary | ICD-10-CM | POA: Insufficient documentation

## 2020-04-20 DIAGNOSIS — R42 Dizziness and giddiness: Secondary | ICD-10-CM | POA: Insufficient documentation

## 2020-04-20 DIAGNOSIS — I10 Essential (primary) hypertension: Secondary | ICD-10-CM | POA: Insufficient documentation

## 2020-04-20 DIAGNOSIS — Z79899 Other long term (current) drug therapy: Secondary | ICD-10-CM | POA: Insufficient documentation

## 2020-04-20 DIAGNOSIS — F0390 Unspecified dementia without behavioral disturbance: Secondary | ICD-10-CM | POA: Diagnosis not present

## 2020-04-20 DIAGNOSIS — Z87891 Personal history of nicotine dependence: Secondary | ICD-10-CM | POA: Insufficient documentation

## 2020-04-20 DIAGNOSIS — R519 Headache, unspecified: Secondary | ICD-10-CM | POA: Diagnosis not present

## 2020-04-20 DIAGNOSIS — G319 Degenerative disease of nervous system, unspecified: Secondary | ICD-10-CM | POA: Diagnosis not present

## 2020-04-20 LAB — COMPREHENSIVE METABOLIC PANEL
ALT: 16 U/L (ref 0–44)
AST: 20 U/L (ref 15–41)
Albumin: 3.4 g/dL — ABNORMAL LOW (ref 3.5–5.0)
Alkaline Phosphatase: 103 U/L (ref 38–126)
Anion gap: 8 (ref 5–15)
BUN: 18 mg/dL (ref 8–23)
CO2: 27 mmol/L (ref 22–32)
Calcium: 9.2 mg/dL (ref 8.9–10.3)
Chloride: 102 mmol/L (ref 98–111)
Creatinine, Ser: 1.28 mg/dL — ABNORMAL HIGH (ref 0.61–1.24)
GFR, Estimated: 54 mL/min — ABNORMAL LOW (ref >60)
Glucose, Bld: 113 mg/dL — ABNORMAL HIGH (ref 70–99)
Potassium: 3.7 mmol/L (ref 3.5–5.1)
Sodium: 137 mmol/L (ref 135–145)
Total Bilirubin: 1.6 mg/dL — ABNORMAL HIGH (ref 0.3–1.2)
Total Protein: 6.6 g/dL (ref 6.5–8.1)

## 2020-04-20 LAB — CBC WITH DIFFERENTIAL/PLATELET
Abs Immature Granulocytes: 0.02 10*3/uL (ref 0.00–0.07)
Basophils Absolute: 0 10*3/uL (ref 0.0–0.1)
Basophils Relative: 0 %
Eosinophils Absolute: 0.2 10*3/uL (ref 0.0–0.5)
Eosinophils Relative: 3 %
HCT: 37.8 % — ABNORMAL LOW (ref 39.0–52.0)
Hemoglobin: 13 g/dL (ref 13.0–17.0)
Immature Granulocytes: 0 %
Lymphocytes Relative: 19 %
Lymphs Abs: 1.7 10*3/uL (ref 0.7–4.0)
MCH: 35.4 pg — ABNORMAL HIGH (ref 26.0–34.0)
MCHC: 34.4 g/dL (ref 30.0–36.0)
MCV: 103 fL — ABNORMAL HIGH (ref 80.0–100.0)
Monocytes Absolute: 1 10*3/uL (ref 0.1–1.0)
Monocytes Relative: 11 %
Neutro Abs: 6.1 10*3/uL (ref 1.7–7.7)
Neutrophils Relative %: 67 %
Platelets: 206 10*3/uL (ref 150–400)
RBC: 3.67 MIL/uL — ABNORMAL LOW (ref 4.22–5.81)
RDW: 12.1 % (ref 11.5–15.5)
WBC: 9.1 10*3/uL (ref 4.0–10.5)
nRBC: 0 % (ref 0.0–0.2)

## 2020-04-20 LAB — PROTIME-INR
INR: 1.1 (ref 0.8–1.2)
Prothrombin Time: 13.3 seconds (ref 11.4–15.2)

## 2020-04-20 MED ORDER — IOHEXOL 350 MG/ML SOLN
75.0000 mL | Freq: Once | INTRAVENOUS | Status: AC | PRN
  Administered 2020-04-20: 75 mL via INTRAVENOUS

## 2020-04-20 NOTE — ED Notes (Signed)
Patient transported to MRI 

## 2020-04-20 NOTE — ED Notes (Signed)
Patient transported to CT 

## 2020-04-20 NOTE — ED Provider Notes (Signed)
[facility] EMERGENCY DEPARTMENT Provider Note   CSN: [id number] Arrival date & time: 04/20/20  1855     History Chief Complaint  Patient presents with  . Headache    Shawn Sanchez is a 87 y.o. male.  HPI      2 weeks of vertigo, started getting headaches last night and this morning As soon as turning head will feel dizziness 8/10 headache, back of head Vertigo has been waxing and waning but constant for last 2 weeks. Has had vertigo diagnosed 5 years ago but never lasted this long.  Is needing to walk with cane because of symptoms. Headache is new.  Does not feel meclizine is helping. No vomiting, diarrhea, chest pain or dyspnea. Denies numbness, weakness, difficulty talking, visual changes or facial droop.    Past Medical History:  Diagnosis Date  . Anemia, unspecified   . Arthritis    likely rheumatoid  . Carpal tunnel syndrome   . Coronary atherosclerosis of unspecified type of vessel, native or graft   . Dementia (HCC)   . Depression   . Esophageal reflux   . Hemorrhage of rectum and anus   . Hernia, inguinal   . History of bronchitis   . History of hiatal hernia    left inguinal   . Hyperchylomicronemia   . Hypothyroidism   . Neuropathy of both feet   . Pneumonia    in the past  . Shortness of breath dyspnea    with exertion  . TIA (transient ischemic attack)   . Unspecified disorder of kidney and ureter   . Unspecified essential hypertension     Patient Active Problem List   Diagnosis Date Noted  . TIA (transient ischemic attack)   . Pneumonia   . Neuropathy of both feet   . Hypothyroidism   . Hyperchylomicronemia   . History of hiatal hernia   . History of bronchitis   . Hernia, inguinal   . Depression   . Dementia (HCC)   . Arthritis   . Left inguinal hernia 02/05/2016  . S/P lumbar laminectomy 09/27/2015  . DEPRESSION 04/17/2009  . CAD, NATIVE VESSEL 09/21/2008  . Disorder of kidney and ureter 04/19/2008  .  ANEMIA-UNSPECIFIED 11/30/2007  . RECTAL BLEEDING 11/30/2007  . FLATULENCE 11/30/2007  . FECAL OCCULT BLOOD 11/30/2007  . HYPERLIPIDEMIA 11/25/2007  . CARPAL TUNNEL SYNDROME 11/25/2007  . Essential hypertension 11/25/2007  . CAD 11/25/2007  . GERD 11/25/2007  . COUGH, CHRONIC 11/25/2007    Past Surgical History:  Procedure Laterality Date  . angioplasy/stent  2006  . carpal tunnel release Bilateral   . COLONOSCOPY    . CORONARY ANGIOPLASTY     2 stents placed in 2006  . INGUINAL HERNIA REPAIR Left 02/06/2016   Procedure: REPAIR LEFT INGUINAL HERNIA  ;  Surgeon: Darnell Level, MD;  Location: [facility];  Service: General;  Laterality: Left;  . INSERTION OF MESH Left 02/06/2016   Procedure: INSERTION OF MESH;  Surgeon: Darnell Level, MD;  Location: [facility];  Service: General;  Laterality: Left;  . lamenectomy  1965  . LUMBAR LAMINECTOMY/DECOMPRESSION MICRODISCECTOMY N/A 09/27/2015   Procedure: Laminectomy and Foraminotomy - Lumbar three-four,  Lumbar four-five;  Surgeon: Tia Alert, MD;  Location: [facility];  Service: Neurosurgery;  Laterality: N/A;  . UPPER GASTROINTESTINAL ENDOSCOPY         Family History  Problem Relation Age of Onset  . Breast cancer Mother   . Lung cancer Mother   .  Diabetes Sister   . Lung cancer Father   . Coronary artery disease Father   . Hypertension Father   . Colon cancer Neg Hx     Social History   Tobacco Use  . Smoking status: Former Smoker    Types: Pipe, Cigars    Quit date: 10/10/1999    Years since quitting: 20.5  . Smokeless tobacco: Never Used  . Tobacco comment: cigars only in 2005  Substance Use Topics  . Alcohol use: Yes    Comment: stopped drinking 2017 after a fall at home  . Drug use: No    Home Medications Prior to Admission medications   Medication Sig Start Date End Date Taking? Authorizing Provider  cholecalciferol (VITAMIN D) 1000 UNITS tablet Take 5,000 Units by mouth daily. Reported on 07/01/2015   Yes [provider]  clopidogrel (PLAVIX) 75 MG tablet Take 75 mg by mouth at bedtime. Patient not taking since 02/02/2016 09/03/13  Yes [provider]  levothyroxine (SYNTHROID, LEVOTHROID) 50 MCG tablet Take 50 mcg by mouth daily before breakfast.   Yes [provider]  Melatonin 5 MG CAPS Take 10 mg by mouth at bedtime as needed (sleep). Reported on 07/01/2015   Yes [provider]  omeprazole (PRILOSEC) 20 MG capsule Take 20 mg by mouth every other day.  09/25/12  Yes [provider]  simvastatin (ZOCOR) 20 MG tablet TAKE 1 TABLET(20 MG) BY MOUTH DAILY Patient taking differently: Take 20 mg by mouth daily. 02/28/20  Yes Lewayne Bunting, MD  Tamsulosin HCl (FLOMAX) 0.4 MG CAPS Take 0.4 mg by mouth daily.  07/20/11  Yes [provider]    Allergies    Epinephrine, Procaine, Aricept [donepezil], Levonordefrin [nordefrin], Mepivacaine, and Other  Review of Systems   Review of Systems  Constitutional: Positive for fatigue. Negative for fever.  Eyes: Negative for visual disturbance.  Respiratory: Negative for shortness of breath.   Cardiovascular: Negative for chest pain.  Gastrointestinal: Negative for abdominal pain, nausea and vomiting.  Genitourinary: Negative for difficulty urinating.  Musculoskeletal: Positive for gait problem. Negative for back pain and neck stiffness.  Skin: Negative for rash.  Neurological: Positive for dizziness and headaches. Negative for syncope, speech difficulty, weakness and numbness.    Physical Exam Updated Vital Signs BP 136/81   Pulse 76   Temp 98.2 F (36.8 C) (Oral)   Resp 20   SpO2 97%   Physical Exam Vitals and nursing note reviewed.  Constitutional:      General: He is not in acute distress.    Appearance: He is well-developed. He is not diaphoretic.  HENT:     Head: Normocephalic and atraumatic.  Eyes:     General: No visual field deficit.    Conjunctiva/sclera: Conjunctivae normal.   Cardiovascular:     Rate and Rhythm: Normal rate and regular rhythm.     Heart sounds: Normal heart sounds. No murmur heard. No friction rub. No gallop.   Pulmonary:     Effort: Pulmonary effort is normal. No respiratory distress.     Breath sounds: Normal breath sounds. No wheezing or rales.  Abdominal:     General: There is no distension.     Palpations: Abdomen is soft.     Tenderness: There is no abdominal tenderness. There is no guarding.  Musculoskeletal:     Cervical back: Normal range of motion.  Skin:    General: Skin is warm and dry.  Neurological:     Mental Status:  He is alert and oriented to person, place, and time.     GCS: GCS eye subscore is 4. GCS verbal subscore is 5. GCS motor subscore is 6.     Cranial Nerves: Cranial nerves are intact. No cranial nerve deficit, dysarthria or facial asymmetry.     Sensory: Sensation is intact.     Coordination: Coordination is intact.     ED Results / Procedures / Treatments   Labs (all labs ordered are listed, but only abnormal results are displayed) Labs Reviewed  CBC WITH DIFFERENTIAL/PLATELET - Abnormal; Notable for the following components:      Result Value   RBC 3.67 (*)    HCT 37.8 (*)    MCV 103.0 (*)    MCH 35.4 (*)    All other components within normal limits  COMPREHENSIVE METABOLIC PANEL - Abnormal; Notable for the following components:   Glucose, Bld 113 (*)    Creatinine, Ser 1.28 (*)    Albumin 3.4 (*)    Total Bilirubin 1.6 (*)    GFR, Estimated 54 (*)    All other components within normal limits  PROTIME-INR    EKG None  Radiology CT Angio Head W or Wo Contrast  Result Date: 04/20/2020 CLINICAL DATA:  Neck and head pain with dizziness EXAM: CT ANGIOGRAPHY HEAD AND NECK TECHNIQUE: Multidetector CT imaging of the head and neck was performed using the standard protocol during bolus administration of intravenous contrast. Multiplanar CT image reconstructions and MIPs were obtained to evaluate the  vascular anatomy. Carotid stenosis measurements (when applicable) are obtained utilizing NASCET criteria, using the distal internal carotid diameter as the denominator. CONTRAST:  75mL OMNIPAQUE IOHEXOL 350 MG/ML SOLN COMPARISON:  None. FINDINGS: CT HEAD FINDINGS Brain: There is no mass, hemorrhage or extra-axial collection. There is generalized atrophy without lobar predilection. There is an old right basal ganglia small vessel infarct. There is hypoattenuation of the periventricular white matter, most commonly indicating chronic ischemic microangiopathy. Skull: The visualized skull base, calvarium and extracranial soft tissues are normal. Sinuses/Orbits: Ethmoid, maxillary and sphenoid sinus mucosal thickening. The orbits are normal. CTA NECK FINDINGS SKELETON: There is no bony spinal canal stenosis. No lytic or blastic lesion. OTHER NECK: Normal pharynx, larynx and major salivary glands. No cervical lymphadenopathy. Unremarkable thyroid gland. UPPER CHEST: No pneumothorax or pleural effusion. No nodules or masses. AORTIC ARCH: There is no calcific atherosclerosis of the aortic arch. There is no aneurysm, dissection or hemodynamically significant stenosis of the visualized portion of the aorta. Conventional 3 vessel aortic branching pattern. The visualized proximal subclavian arteries are widely patent. RIGHT CAROTID SYSTEM: Normal without aneurysm, dissection or stenosis. LEFT CAROTID SYSTEM: Normal without aneurysm, dissection or stenosis. VERTEBRAL ARTERIES: Left dominant configuration. Both origins are clearly patent. There is no dissection, occlusion or flow-limiting stenosis to the skull base (V1-V3 segments). CTA HEAD FINDINGS POSTERIOR CIRCULATION: --Vertebral arteries: Normal V4 segments. --Inferior cerebellar arteries: Normal. --Basilar artery: Normal. --Superior cerebellar arteries: Normal. --Posterior cerebral arteries (PCA): Normal. ANTERIOR CIRCULATION: --Intracranial internal carotid arteries:  Atherosclerotic calcification of the internal carotid arteries at the skull base without hemodynamically significant stenosis. --Anterior cerebral arteries (ACA): Normal. Both A1 segments are present. Patent anterior communicating artery (a-comm). --Middle cerebral arteries (MCA): Normal. VENOUS SINUSES: As permitted by contrast timing, patent. ANATOMIC VARIANTS: None Review of the MIP images confirms the above findings. IMPRESSION: 1. No emergent large vessel occlusion or high-grade stenosis of the head or neck. 2. Old right basal ganglia small vessel infarct and generalized atrophy.  Electronically Signed   By: Deatra Robinson M.D.   On: 04/20/2020 21:48   CT Angio Neck W and/or Wo Contrast  Result Date: 04/20/2020 CLINICAL DATA:  Neck and head pain with dizziness EXAM: CT ANGIOGRAPHY HEAD AND NECK TECHNIQUE: Multidetector CT imaging of the head and neck was performed using the standard protocol during bolus administration of intravenous contrast. Multiplanar CT image reconstructions and MIPs were obtained to evaluate the vascular anatomy. Carotid stenosis measurements (when applicable) are obtained utilizing NASCET criteria, using the distal internal carotid diameter as the denominator. CONTRAST:  75mL OMNIPAQUE IOHEXOL 350 MG/ML SOLN COMPARISON:  None. FINDINGS: CT HEAD FINDINGS Brain: There is no mass, hemorrhage or extra-axial collection. There is generalized atrophy without lobar predilection. There is an old right basal ganglia small vessel infarct. There is hypoattenuation of the periventricular white matter, most commonly indicating chronic ischemic microangiopathy. Skull: The visualized skull base, calvarium and extracranial soft tissues are normal. Sinuses/Orbits: Ethmoid, maxillary and sphenoid sinus mucosal thickening. The orbits are normal. CTA NECK FINDINGS SKELETON: There is no bony spinal canal stenosis. No lytic or blastic lesion. OTHER NECK: Normal pharynx, larynx and major salivary glands. No  cervical lymphadenopathy. Unremarkable thyroid gland. UPPER CHEST: No pneumothorax or pleural effusion. No nodules or masses. AORTIC ARCH: There is no calcific atherosclerosis of the aortic arch. There is no aneurysm, dissection or hemodynamically significant stenosis of the visualized portion of the aorta. Conventional 3 vessel aortic branching pattern. The visualized proximal subclavian arteries are widely patent. RIGHT CAROTID SYSTEM: Normal without aneurysm, dissection or stenosis. LEFT CAROTID SYSTEM: Normal without aneurysm, dissection or stenosis. VERTEBRAL ARTERIES: Left dominant configuration. Both origins are clearly patent. There is no dissection, occlusion or flow-limiting stenosis to the skull base (V1-V3 segments). CTA HEAD FINDINGS POSTERIOR CIRCULATION: --Vertebral arteries: Normal V4 segments. --Inferior cerebellar arteries: Normal. --Basilar artery: Normal. --Superior cerebellar arteries: Normal. --Posterior cerebral arteries (PCA): Normal. ANTERIOR CIRCULATION: --Intracranial internal carotid arteries: Atherosclerotic calcification of the internal carotid arteries at the skull base without hemodynamically significant stenosis. --Anterior cerebral arteries (ACA): Normal. Both A1 segments are present. Patent anterior communicating artery (a-comm). --Middle cerebral arteries (MCA): Normal. VENOUS SINUSES: As permitted by contrast timing, patent. ANATOMIC VARIANTS: None Review of the MIP images confirms the above findings. IMPRESSION: 1. No emergent large vessel occlusion or high-grade stenosis of the head or neck. 2. Old right basal ganglia small vessel infarct and generalized atrophy. Electronically Signed   By: Deatra Robinson M.D.   On: 04/20/2020 21:48   MR BRAIN WO CONTRAST  Result Date: 04/20/2020 CLINICAL DATA:  Central vertigo EXAM: MRI HEAD WITHOUT CONTRAST TECHNIQUE: Multiplanar, multiecho pulse sequences of the brain and surrounding structures were obtained without intravenous contrast.  COMPARISON:  None. FINDINGS: Brain: No acute infarct, mass effect or extra-axial collection. No acute or chronic hemorrhage. There is multifocal hyperintense T2-weighted signal within the white matter. Diffuse, advanced atrophy. The midline structures are normal. Vascular: Major flow voids are preserved. Skull and upper cervical spine: Normal calvarium and skull base. Visualized upper cervical spine and soft tissues are normal. Sinuses/Orbits:Bilateral maxillary sinus mucosal thickening. Normal orbits. IMPRESSION: 1. No acute intracranial abnormality. 2. Diffuse, advanced atrophy and findings of chronic small vessel disease. Electronically Signed   By: Deatra Robinson M.D.   On: 04/20/2020 23:42    Procedures Procedures   Medications Ordered in ED Medications  iohexol (OMNIPAQUE) 350 MG/ML injection 75 mL (75 mLs Intravenous Contrast Given 04/20/20 2135)    ED Course  I have reviewed the  triage vital signs and the nursing notes.  Pertinent labs & imaging results that were available during my care of the patient were reviewed by me and considered in my medical decision making (see chart for details).    MDM Rules/Calculators/A&P                          87yo male with history above presents with concern for vertigo for 2 weeks and headache. Was seen in the ED and diagnosed with peripheral vertigo clinically.  Given headache, persistent vertigo symptoms which have not improved with meclizine, evaluated for atherosclerotic disease/aneurysm/thrombosis with CTA which shows no acute findings.  MR ordered given concern for possible central source/CVA by risk factors, persistent symptoms shows no acute abnormalities. Recommend PCP follow up, may need vestibular rehab.  No sign of other acute abnormalities, infectious symptoms, anemia, electrolyte abnormalities.  Patient discharged in stable condition with understanding of reasons to return.   Final Clinical Impression(s) / ED Diagnoses Final diagnoses:   Acute nonintractable headache, unspecified headache type  Vertigo    Rx / DC Orders ED Discharge Orders    None       Alvira Monday, MD 04/21/20 1212

## 2020-04-20 NOTE — ED Triage Notes (Signed)
Pt c/o head and neck pain, pain worse with movement. C/o dizziness and nausea x 2 weeks. No other neuro deficits noted. A&O x 4.

## 2020-05-02 DIAGNOSIS — J3489 Other specified disorders of nose and nasal sinuses: Secondary | ICD-10-CM | POA: Diagnosis not present

## 2020-05-02 DIAGNOSIS — H903 Sensorineural hearing loss, bilateral: Secondary | ICD-10-CM | POA: Diagnosis not present

## 2020-05-02 DIAGNOSIS — R42 Dizziness and giddiness: Secondary | ICD-10-CM | POA: Diagnosis not present

## 2020-09-06 DIAGNOSIS — R739 Hyperglycemia, unspecified: Secondary | ICD-10-CM | POA: Diagnosis not present

## 2020-09-06 DIAGNOSIS — N1831 Chronic kidney disease, stage 3a: Secondary | ICD-10-CM | POA: Diagnosis not present

## 2020-09-06 DIAGNOSIS — I129 Hypertensive chronic kidney disease with stage 1 through stage 4 chronic kidney disease, or unspecified chronic kidney disease: Secondary | ICD-10-CM | POA: Diagnosis not present

## 2020-09-06 DIAGNOSIS — E039 Hypothyroidism, unspecified: Secondary | ICD-10-CM | POA: Diagnosis not present

## 2020-09-06 DIAGNOSIS — Z125 Encounter for screening for malignant neoplasm of prostate: Secondary | ICD-10-CM | POA: Diagnosis not present

## 2020-09-06 DIAGNOSIS — E785 Hyperlipidemia, unspecified: Secondary | ICD-10-CM | POA: Diagnosis not present

## 2020-09-12 DIAGNOSIS — E785 Hyperlipidemia, unspecified: Secondary | ICD-10-CM | POA: Diagnosis not present

## 2020-09-12 DIAGNOSIS — R82998 Other abnormal findings in urine: Secondary | ICD-10-CM | POA: Diagnosis not present

## 2020-09-12 DIAGNOSIS — I129 Hypertensive chronic kidney disease with stage 1 through stage 4 chronic kidney disease, or unspecified chronic kidney disease: Secondary | ICD-10-CM | POA: Diagnosis not present

## 2020-09-12 DIAGNOSIS — N401 Enlarged prostate with lower urinary tract symptoms: Secondary | ICD-10-CM | POA: Diagnosis not present

## 2020-09-12 DIAGNOSIS — F331 Major depressive disorder, recurrent, moderate: Secondary | ICD-10-CM | POA: Diagnosis not present

## 2020-09-12 DIAGNOSIS — G9689 Other specified disorders of central nervous system: Secondary | ICD-10-CM | POA: Diagnosis not present

## 2020-09-12 DIAGNOSIS — M5416 Radiculopathy, lumbar region: Secondary | ICD-10-CM | POA: Diagnosis not present

## 2020-09-12 DIAGNOSIS — Z1331 Encounter for screening for depression: Secondary | ICD-10-CM | POA: Diagnosis not present

## 2020-09-12 DIAGNOSIS — E039 Hypothyroidism, unspecified: Secondary | ICD-10-CM | POA: Diagnosis not present

## 2020-09-12 DIAGNOSIS — Z0000 Encounter for general adult medical examination without abnormal findings: Secondary | ICD-10-CM | POA: Diagnosis not present

## 2020-09-12 DIAGNOSIS — I2510 Atherosclerotic heart disease of native coronary artery without angina pectoris: Secondary | ICD-10-CM | POA: Diagnosis not present

## 2020-09-12 DIAGNOSIS — R739 Hyperglycemia, unspecified: Secondary | ICD-10-CM | POA: Diagnosis not present

## 2020-09-12 DIAGNOSIS — Z1339 Encounter for screening examination for other mental health and behavioral disorders: Secondary | ICD-10-CM | POA: Diagnosis not present

## 2020-09-12 DIAGNOSIS — R413 Other amnesia: Secondary | ICD-10-CM | POA: Diagnosis not present

## 2020-09-12 DIAGNOSIS — N1831 Chronic kidney disease, stage 3a: Secondary | ICD-10-CM | POA: Diagnosis not present

## 2020-09-26 DIAGNOSIS — H8110 Benign paroxysmal vertigo, unspecified ear: Secondary | ICD-10-CM | POA: Diagnosis not present

## 2020-09-26 DIAGNOSIS — I129 Hypertensive chronic kidney disease with stage 1 through stage 4 chronic kidney disease, or unspecified chronic kidney disease: Secondary | ICD-10-CM | POA: Diagnosis not present

## 2020-09-26 DIAGNOSIS — M48062 Spinal stenosis, lumbar region with neurogenic claudication: Secondary | ICD-10-CM | POA: Diagnosis not present

## 2020-09-26 DIAGNOSIS — F331 Major depressive disorder, recurrent, moderate: Secondary | ICD-10-CM | POA: Diagnosis not present

## 2020-09-26 DIAGNOSIS — I2510 Atherosclerotic heart disease of native coronary artery without angina pectoris: Secondary | ICD-10-CM | POA: Diagnosis not present

## 2020-09-26 DIAGNOSIS — M7020 Olecranon bursitis, unspecified elbow: Secondary | ICD-10-CM | POA: Diagnosis not present

## 2020-09-26 DIAGNOSIS — J302 Other seasonal allergic rhinitis: Secondary | ICD-10-CM | POA: Diagnosis not present

## 2020-09-26 DIAGNOSIS — M1990 Unspecified osteoarthritis, unspecified site: Secondary | ICD-10-CM | POA: Diagnosis not present

## 2020-09-26 DIAGNOSIS — G250 Essential tremor: Secondary | ICD-10-CM | POA: Diagnosis not present

## 2020-09-26 DIAGNOSIS — K056 Periodontal disease, unspecified: Secondary | ICD-10-CM | POA: Diagnosis not present

## 2020-09-26 DIAGNOSIS — F0280 Dementia in other diseases classified elsewhere without behavioral disturbance: Secondary | ICD-10-CM | POA: Diagnosis not present

## 2020-09-26 DIAGNOSIS — N401 Enlarged prostate with lower urinary tract symptoms: Secondary | ICD-10-CM | POA: Diagnosis not present

## 2020-09-26 DIAGNOSIS — M7541 Impingement syndrome of right shoulder: Secondary | ICD-10-CM | POA: Diagnosis not present

## 2020-09-26 DIAGNOSIS — K219 Gastro-esophageal reflux disease without esophagitis: Secondary | ICD-10-CM | POA: Diagnosis not present

## 2020-09-26 DIAGNOSIS — I6789 Other cerebrovascular disease: Secondary | ICD-10-CM | POA: Diagnosis not present

## 2020-09-26 DIAGNOSIS — G4700 Insomnia, unspecified: Secondary | ICD-10-CM | POA: Diagnosis not present

## 2020-09-26 DIAGNOSIS — G319 Degenerative disease of nervous system, unspecified: Secondary | ICD-10-CM | POA: Diagnosis not present

## 2020-09-26 DIAGNOSIS — E039 Hypothyroidism, unspecified: Secondary | ICD-10-CM | POA: Diagnosis not present

## 2020-09-26 DIAGNOSIS — K5909 Other constipation: Secondary | ICD-10-CM | POA: Diagnosis not present

## 2020-09-26 DIAGNOSIS — M5116 Intervertebral disc disorders with radiculopathy, lumbar region: Secondary | ICD-10-CM | POA: Diagnosis not present

## 2020-09-26 DIAGNOSIS — E785 Hyperlipidemia, unspecified: Secondary | ICD-10-CM | POA: Diagnosis not present

## 2020-09-26 DIAGNOSIS — G5600 Carpal tunnel syndrome, unspecified upper limb: Secondary | ICD-10-CM | POA: Diagnosis not present

## 2020-09-26 DIAGNOSIS — M75100 Unspecified rotator cuff tear or rupture of unspecified shoulder, not specified as traumatic: Secondary | ICD-10-CM | POA: Diagnosis not present

## 2020-09-26 DIAGNOSIS — N1831 Chronic kidney disease, stage 3a: Secondary | ICD-10-CM | POA: Diagnosis not present

## 2020-09-26 DIAGNOSIS — L309 Dermatitis, unspecified: Secondary | ICD-10-CM | POA: Diagnosis not present

## 2020-09-28 DIAGNOSIS — M75100 Unspecified rotator cuff tear or rupture of unspecified shoulder, not specified as traumatic: Secondary | ICD-10-CM | POA: Diagnosis not present

## 2020-09-28 DIAGNOSIS — M7020 Olecranon bursitis, unspecified elbow: Secondary | ICD-10-CM | POA: Diagnosis not present

## 2020-09-28 DIAGNOSIS — M1990 Unspecified osteoarthritis, unspecified site: Secondary | ICD-10-CM | POA: Diagnosis not present

## 2020-09-28 DIAGNOSIS — M5116 Intervertebral disc disorders with radiculopathy, lumbar region: Secondary | ICD-10-CM | POA: Diagnosis not present

## 2020-09-28 DIAGNOSIS — M48062 Spinal stenosis, lumbar region with neurogenic claudication: Secondary | ICD-10-CM | POA: Diagnosis not present

## 2020-09-28 DIAGNOSIS — M7541 Impingement syndrome of right shoulder: Secondary | ICD-10-CM | POA: Diagnosis not present

## 2020-10-02 DIAGNOSIS — M48062 Spinal stenosis, lumbar region with neurogenic claudication: Secondary | ICD-10-CM | POA: Diagnosis not present

## 2020-10-02 DIAGNOSIS — M1990 Unspecified osteoarthritis, unspecified site: Secondary | ICD-10-CM | POA: Diagnosis not present

## 2020-10-02 DIAGNOSIS — M5116 Intervertebral disc disorders with radiculopathy, lumbar region: Secondary | ICD-10-CM | POA: Diagnosis not present

## 2020-10-02 DIAGNOSIS — M7541 Impingement syndrome of right shoulder: Secondary | ICD-10-CM | POA: Diagnosis not present

## 2020-10-02 DIAGNOSIS — M75100 Unspecified rotator cuff tear or rupture of unspecified shoulder, not specified as traumatic: Secondary | ICD-10-CM | POA: Diagnosis not present

## 2020-10-02 DIAGNOSIS — M7020 Olecranon bursitis, unspecified elbow: Secondary | ICD-10-CM | POA: Diagnosis not present

## 2020-10-04 DIAGNOSIS — M1990 Unspecified osteoarthritis, unspecified site: Secondary | ICD-10-CM | POA: Diagnosis not present

## 2020-10-04 DIAGNOSIS — M7541 Impingement syndrome of right shoulder: Secondary | ICD-10-CM | POA: Diagnosis not present

## 2020-10-04 DIAGNOSIS — M7020 Olecranon bursitis, unspecified elbow: Secondary | ICD-10-CM | POA: Diagnosis not present

## 2020-10-04 DIAGNOSIS — M48062 Spinal stenosis, lumbar region with neurogenic claudication: Secondary | ICD-10-CM | POA: Diagnosis not present

## 2020-10-04 DIAGNOSIS — M75100 Unspecified rotator cuff tear or rupture of unspecified shoulder, not specified as traumatic: Secondary | ICD-10-CM | POA: Diagnosis not present

## 2020-10-04 DIAGNOSIS — M5116 Intervertebral disc disorders with radiculopathy, lumbar region: Secondary | ICD-10-CM | POA: Diagnosis not present

## 2020-10-10 DIAGNOSIS — M7020 Olecranon bursitis, unspecified elbow: Secondary | ICD-10-CM | POA: Diagnosis not present

## 2020-10-10 DIAGNOSIS — M1990 Unspecified osteoarthritis, unspecified site: Secondary | ICD-10-CM | POA: Diagnosis not present

## 2020-10-10 DIAGNOSIS — M7541 Impingement syndrome of right shoulder: Secondary | ICD-10-CM | POA: Diagnosis not present

## 2020-10-10 DIAGNOSIS — M48062 Spinal stenosis, lumbar region with neurogenic claudication: Secondary | ICD-10-CM | POA: Diagnosis not present

## 2020-10-10 DIAGNOSIS — M5116 Intervertebral disc disorders with radiculopathy, lumbar region: Secondary | ICD-10-CM | POA: Diagnosis not present

## 2020-10-10 DIAGNOSIS — M75100 Unspecified rotator cuff tear or rupture of unspecified shoulder, not specified as traumatic: Secondary | ICD-10-CM | POA: Diagnosis not present

## 2020-10-11 DIAGNOSIS — M7541 Impingement syndrome of right shoulder: Secondary | ICD-10-CM | POA: Diagnosis not present

## 2020-10-11 DIAGNOSIS — M75100 Unspecified rotator cuff tear or rupture of unspecified shoulder, not specified as traumatic: Secondary | ICD-10-CM | POA: Diagnosis not present

## 2020-10-11 DIAGNOSIS — M1990 Unspecified osteoarthritis, unspecified site: Secondary | ICD-10-CM | POA: Diagnosis not present

## 2020-10-11 DIAGNOSIS — M7020 Olecranon bursitis, unspecified elbow: Secondary | ICD-10-CM | POA: Diagnosis not present

## 2020-10-11 DIAGNOSIS — M5116 Intervertebral disc disorders with radiculopathy, lumbar region: Secondary | ICD-10-CM | POA: Diagnosis not present

## 2020-10-11 DIAGNOSIS — M48062 Spinal stenosis, lumbar region with neurogenic claudication: Secondary | ICD-10-CM | POA: Diagnosis not present

## 2020-10-12 DIAGNOSIS — M7020 Olecranon bursitis, unspecified elbow: Secondary | ICD-10-CM | POA: Diagnosis not present

## 2020-10-12 DIAGNOSIS — M5116 Intervertebral disc disorders with radiculopathy, lumbar region: Secondary | ICD-10-CM | POA: Diagnosis not present

## 2020-10-12 DIAGNOSIS — M1990 Unspecified osteoarthritis, unspecified site: Secondary | ICD-10-CM | POA: Diagnosis not present

## 2020-10-12 DIAGNOSIS — M7541 Impingement syndrome of right shoulder: Secondary | ICD-10-CM | POA: Diagnosis not present

## 2020-10-12 DIAGNOSIS — M75100 Unspecified rotator cuff tear or rupture of unspecified shoulder, not specified as traumatic: Secondary | ICD-10-CM | POA: Diagnosis not present

## 2020-10-12 DIAGNOSIS — M48062 Spinal stenosis, lumbar region with neurogenic claudication: Secondary | ICD-10-CM | POA: Diagnosis not present

## 2020-10-16 DIAGNOSIS — M48062 Spinal stenosis, lumbar region with neurogenic claudication: Secondary | ICD-10-CM | POA: Diagnosis not present

## 2020-10-16 DIAGNOSIS — M5116 Intervertebral disc disorders with radiculopathy, lumbar region: Secondary | ICD-10-CM | POA: Diagnosis not present

## 2020-10-16 DIAGNOSIS — M1990 Unspecified osteoarthritis, unspecified site: Secondary | ICD-10-CM | POA: Diagnosis not present

## 2020-10-16 DIAGNOSIS — M7020 Olecranon bursitis, unspecified elbow: Secondary | ICD-10-CM | POA: Diagnosis not present

## 2020-10-16 DIAGNOSIS — M7541 Impingement syndrome of right shoulder: Secondary | ICD-10-CM | POA: Diagnosis not present

## 2020-10-16 DIAGNOSIS — M75100 Unspecified rotator cuff tear or rupture of unspecified shoulder, not specified as traumatic: Secondary | ICD-10-CM | POA: Diagnosis not present

## 2020-10-19 DIAGNOSIS — M48062 Spinal stenosis, lumbar region with neurogenic claudication: Secondary | ICD-10-CM | POA: Diagnosis not present

## 2020-10-19 DIAGNOSIS — M7020 Olecranon bursitis, unspecified elbow: Secondary | ICD-10-CM | POA: Diagnosis not present

## 2020-10-19 DIAGNOSIS — M75100 Unspecified rotator cuff tear or rupture of unspecified shoulder, not specified as traumatic: Secondary | ICD-10-CM | POA: Diagnosis not present

## 2020-10-19 DIAGNOSIS — M5116 Intervertebral disc disorders with radiculopathy, lumbar region: Secondary | ICD-10-CM | POA: Diagnosis not present

## 2020-10-19 DIAGNOSIS — M7541 Impingement syndrome of right shoulder: Secondary | ICD-10-CM | POA: Diagnosis not present

## 2020-10-19 DIAGNOSIS — M1990 Unspecified osteoarthritis, unspecified site: Secondary | ICD-10-CM | POA: Diagnosis not present

## 2020-10-23 DIAGNOSIS — M75100 Unspecified rotator cuff tear or rupture of unspecified shoulder, not specified as traumatic: Secondary | ICD-10-CM | POA: Diagnosis not present

## 2020-10-23 DIAGNOSIS — M5116 Intervertebral disc disorders with radiculopathy, lumbar region: Secondary | ICD-10-CM | POA: Diagnosis not present

## 2020-10-23 DIAGNOSIS — M7020 Olecranon bursitis, unspecified elbow: Secondary | ICD-10-CM | POA: Diagnosis not present

## 2020-10-23 DIAGNOSIS — M7541 Impingement syndrome of right shoulder: Secondary | ICD-10-CM | POA: Diagnosis not present

## 2020-10-23 DIAGNOSIS — M48062 Spinal stenosis, lumbar region with neurogenic claudication: Secondary | ICD-10-CM | POA: Diagnosis not present

## 2020-10-23 DIAGNOSIS — M1990 Unspecified osteoarthritis, unspecified site: Secondary | ICD-10-CM | POA: Diagnosis not present

## 2020-10-26 DIAGNOSIS — K056 Periodontal disease, unspecified: Secondary | ICD-10-CM | POA: Diagnosis not present

## 2020-10-26 DIAGNOSIS — M5116 Intervertebral disc disorders with radiculopathy, lumbar region: Secondary | ICD-10-CM | POA: Diagnosis not present

## 2020-10-26 DIAGNOSIS — K5909 Other constipation: Secondary | ICD-10-CM | POA: Diagnosis not present

## 2020-10-26 DIAGNOSIS — M48062 Spinal stenosis, lumbar region with neurogenic claudication: Secondary | ICD-10-CM | POA: Diagnosis not present

## 2020-10-26 DIAGNOSIS — G4700 Insomnia, unspecified: Secondary | ICD-10-CM | POA: Diagnosis not present

## 2020-10-26 DIAGNOSIS — M7020 Olecranon bursitis, unspecified elbow: Secondary | ICD-10-CM | POA: Diagnosis not present

## 2020-10-26 DIAGNOSIS — N1831 Chronic kidney disease, stage 3a: Secondary | ICD-10-CM | POA: Diagnosis not present

## 2020-10-26 DIAGNOSIS — J302 Other seasonal allergic rhinitis: Secondary | ICD-10-CM | POA: Diagnosis not present

## 2020-10-26 DIAGNOSIS — E039 Hypothyroidism, unspecified: Secondary | ICD-10-CM | POA: Diagnosis not present

## 2020-10-26 DIAGNOSIS — M7541 Impingement syndrome of right shoulder: Secondary | ICD-10-CM | POA: Diagnosis not present

## 2020-10-26 DIAGNOSIS — I129 Hypertensive chronic kidney disease with stage 1 through stage 4 chronic kidney disease, or unspecified chronic kidney disease: Secondary | ICD-10-CM | POA: Diagnosis not present

## 2020-10-26 DIAGNOSIS — G250 Essential tremor: Secondary | ICD-10-CM | POA: Diagnosis not present

## 2020-10-26 DIAGNOSIS — F0280 Dementia in other diseases classified elsewhere without behavioral disturbance: Secondary | ICD-10-CM | POA: Diagnosis not present

## 2020-10-26 DIAGNOSIS — E785 Hyperlipidemia, unspecified: Secondary | ICD-10-CM | POA: Diagnosis not present

## 2020-10-26 DIAGNOSIS — I6789 Other cerebrovascular disease: Secondary | ICD-10-CM | POA: Diagnosis not present

## 2020-10-26 DIAGNOSIS — H8110 Benign paroxysmal vertigo, unspecified ear: Secondary | ICD-10-CM | POA: Diagnosis not present

## 2020-10-26 DIAGNOSIS — G5600 Carpal tunnel syndrome, unspecified upper limb: Secondary | ICD-10-CM | POA: Diagnosis not present

## 2020-10-26 DIAGNOSIS — K219 Gastro-esophageal reflux disease without esophagitis: Secondary | ICD-10-CM | POA: Diagnosis not present

## 2020-10-26 DIAGNOSIS — L309 Dermatitis, unspecified: Secondary | ICD-10-CM | POA: Diagnosis not present

## 2020-10-26 DIAGNOSIS — M75100 Unspecified rotator cuff tear or rupture of unspecified shoulder, not specified as traumatic: Secondary | ICD-10-CM | POA: Diagnosis not present

## 2020-10-26 DIAGNOSIS — M1990 Unspecified osteoarthritis, unspecified site: Secondary | ICD-10-CM | POA: Diagnosis not present

## 2020-10-26 DIAGNOSIS — F331 Major depressive disorder, recurrent, moderate: Secondary | ICD-10-CM | POA: Diagnosis not present

## 2020-10-26 DIAGNOSIS — G319 Degenerative disease of nervous system, unspecified: Secondary | ICD-10-CM | POA: Diagnosis not present

## 2020-10-26 DIAGNOSIS — N401 Enlarged prostate with lower urinary tract symptoms: Secondary | ICD-10-CM | POA: Diagnosis not present

## 2020-10-26 DIAGNOSIS — I2510 Atherosclerotic heart disease of native coronary artery without angina pectoris: Secondary | ICD-10-CM | POA: Diagnosis not present

## 2020-10-30 DIAGNOSIS — M7541 Impingement syndrome of right shoulder: Secondary | ICD-10-CM | POA: Diagnosis not present

## 2020-10-30 DIAGNOSIS — M7020 Olecranon bursitis, unspecified elbow: Secondary | ICD-10-CM | POA: Diagnosis not present

## 2020-10-30 DIAGNOSIS — M5116 Intervertebral disc disorders with radiculopathy, lumbar region: Secondary | ICD-10-CM | POA: Diagnosis not present

## 2020-10-30 DIAGNOSIS — M1990 Unspecified osteoarthritis, unspecified site: Secondary | ICD-10-CM | POA: Diagnosis not present

## 2020-10-30 DIAGNOSIS — M75100 Unspecified rotator cuff tear or rupture of unspecified shoulder, not specified as traumatic: Secondary | ICD-10-CM | POA: Diagnosis not present

## 2020-10-30 DIAGNOSIS — M48062 Spinal stenosis, lumbar region with neurogenic claudication: Secondary | ICD-10-CM | POA: Diagnosis not present

## 2020-11-08 DIAGNOSIS — M75100 Unspecified rotator cuff tear or rupture of unspecified shoulder, not specified as traumatic: Secondary | ICD-10-CM | POA: Diagnosis not present

## 2020-11-08 DIAGNOSIS — M5116 Intervertebral disc disorders with radiculopathy, lumbar region: Secondary | ICD-10-CM | POA: Diagnosis not present

## 2020-11-08 DIAGNOSIS — M1990 Unspecified osteoarthritis, unspecified site: Secondary | ICD-10-CM | POA: Diagnosis not present

## 2020-11-08 DIAGNOSIS — M48062 Spinal stenosis, lumbar region with neurogenic claudication: Secondary | ICD-10-CM | POA: Diagnosis not present

## 2020-11-08 DIAGNOSIS — M7541 Impingement syndrome of right shoulder: Secondary | ICD-10-CM | POA: Diagnosis not present

## 2020-11-08 DIAGNOSIS — M7020 Olecranon bursitis, unspecified elbow: Secondary | ICD-10-CM | POA: Diagnosis not present

## 2021-01-31 ENCOUNTER — Other Ambulatory Visit: Payer: Self-pay

## 2021-01-31 ENCOUNTER — Emergency Department (HOSPITAL_COMMUNITY): Payer: Medicare Other

## 2021-01-31 ENCOUNTER — Inpatient Hospital Stay (HOSPITAL_COMMUNITY)
Admission: EM | Admit: 2021-01-31 | Discharge: 2021-02-06 | DRG: 563 | Disposition: A | Discharge status: Skilled Nursing Facility | Payer: Medicare Other | Attending: Internal Medicine | Admitting: Internal Medicine

## 2021-01-31 ENCOUNTER — Encounter (HOSPITAL_COMMUNITY): Payer: Self-pay

## 2021-01-31 DIAGNOSIS — E039 Hypothyroidism, unspecified: Secondary | ICD-10-CM | POA: Diagnosis present

## 2021-01-31 DIAGNOSIS — I129 Hypertensive chronic kidney disease with stage 1 through stage 4 chronic kidney disease, or unspecified chronic kidney disease: Secondary | ICD-10-CM | POA: Diagnosis present

## 2021-01-31 DIAGNOSIS — S42292A Other displaced fracture of upper end of left humerus, initial encounter for closed fracture: Secondary | ICD-10-CM | POA: Diagnosis not present

## 2021-01-31 DIAGNOSIS — M549 Dorsalgia, unspecified: Secondary | ICD-10-CM | POA: Diagnosis not present

## 2021-01-31 DIAGNOSIS — Z20822 Contact with and (suspected) exposure to covid-19: Secondary | ICD-10-CM | POA: Diagnosis present

## 2021-01-31 DIAGNOSIS — M25562 Pain in left knee: Secondary | ICD-10-CM | POA: Diagnosis not present

## 2021-01-31 DIAGNOSIS — Z79890 Hormone replacement therapy (postmenopausal): Secondary | ICD-10-CM | POA: Diagnosis not present

## 2021-01-31 DIAGNOSIS — Z8701 Personal history of pneumonia (recurrent): Secondary | ICD-10-CM | POA: Diagnosis not present

## 2021-01-31 DIAGNOSIS — Z8673 Personal history of transient ischemic attack (TIA), and cerebral infarction without residual deficits: Secondary | ICD-10-CM

## 2021-01-31 DIAGNOSIS — M2578 Osteophyte, vertebrae: Secondary | ICD-10-CM | POA: Diagnosis not present

## 2021-01-31 DIAGNOSIS — Z79899 Other long term (current) drug therapy: Secondary | ICD-10-CM

## 2021-01-31 DIAGNOSIS — G629 Polyneuropathy, unspecified: Secondary | ICD-10-CM | POA: Diagnosis present

## 2021-01-31 DIAGNOSIS — F03A3 Unspecified dementia, mild, with mood disturbance: Secondary | ICD-10-CM | POA: Diagnosis present

## 2021-01-31 DIAGNOSIS — S0990XA Unspecified injury of head, initial encounter: Secondary | ICD-10-CM | POA: Diagnosis not present

## 2021-01-31 DIAGNOSIS — Z8249 Family history of ischemic heart disease and other diseases of the circulatory system: Secondary | ICD-10-CM | POA: Diagnosis not present

## 2021-01-31 DIAGNOSIS — Z9181 History of falling: Secondary | ICD-10-CM | POA: Diagnosis not present

## 2021-01-31 DIAGNOSIS — G4700 Insomnia, unspecified: Secondary | ICD-10-CM | POA: Diagnosis present

## 2021-01-31 DIAGNOSIS — R52 Pain, unspecified: Secondary | ICD-10-CM | POA: Diagnosis not present

## 2021-01-31 DIAGNOSIS — Z9861 Coronary angioplasty status: Secondary | ICD-10-CM | POA: Insufficient documentation

## 2021-01-31 DIAGNOSIS — S42212A Unspecified displaced fracture of surgical neck of left humerus, initial encounter for closed fracture: Secondary | ICD-10-CM | POA: Diagnosis not present

## 2021-01-31 DIAGNOSIS — R262 Difficulty in walking, not elsewhere classified: Secondary | ICD-10-CM | POA: Diagnosis present

## 2021-01-31 DIAGNOSIS — N1832 Chronic kidney disease, stage 3b: Secondary | ICD-10-CM | POA: Diagnosis present

## 2021-01-31 DIAGNOSIS — E785 Hyperlipidemia, unspecified: Secondary | ICD-10-CM | POA: Diagnosis not present

## 2021-01-31 DIAGNOSIS — R404 Transient alteration of awareness: Secondary | ICD-10-CM | POA: Diagnosis not present

## 2021-01-31 DIAGNOSIS — Z87891 Personal history of nicotine dependence: Secondary | ICD-10-CM

## 2021-01-31 DIAGNOSIS — I10 Essential (primary) hypertension: Secondary | ICD-10-CM | POA: Diagnosis not present

## 2021-01-31 DIAGNOSIS — W010XXA Fall on same level from slipping, tripping and stumbling without subsequent striking against object, initial encounter: Secondary | ICD-10-CM | POA: Diagnosis present

## 2021-01-31 DIAGNOSIS — R079 Chest pain, unspecified: Secondary | ICD-10-CM | POA: Diagnosis not present

## 2021-01-31 DIAGNOSIS — I2510 Atherosclerotic heart disease of native coronary artery without angina pectoris: Secondary | ICD-10-CM | POA: Diagnosis not present

## 2021-01-31 DIAGNOSIS — Z743 Need for continuous supervision: Secondary | ICD-10-CM | POA: Diagnosis not present

## 2021-01-31 DIAGNOSIS — J45909 Unspecified asthma, uncomplicated: Secondary | ICD-10-CM | POA: Insufficient documentation

## 2021-01-31 DIAGNOSIS — N401 Enlarged prostate with lower urinary tract symptoms: Secondary | ICD-10-CM | POA: Diagnosis present

## 2021-01-31 DIAGNOSIS — K219 Gastro-esophageal reflux disease without esophagitis: Secondary | ICD-10-CM | POA: Diagnosis present

## 2021-01-31 DIAGNOSIS — R279 Unspecified lack of coordination: Secondary | ICD-10-CM | POA: Diagnosis present

## 2021-01-31 DIAGNOSIS — M79602 Pain in left arm: Secondary | ICD-10-CM | POA: Diagnosis not present

## 2021-01-31 DIAGNOSIS — Z7902 Long term (current) use of antithrombotics/antiplatelets: Secondary | ICD-10-CM

## 2021-01-31 DIAGNOSIS — S42309A Unspecified fracture of shaft of humerus, unspecified arm, initial encounter for closed fracture: Secondary | ICD-10-CM | POA: Diagnosis present

## 2021-01-31 DIAGNOSIS — R9431 Abnormal electrocardiogram [ECG] [EKG]: Secondary | ICD-10-CM | POA: Diagnosis not present

## 2021-01-31 DIAGNOSIS — R41841 Cognitive communication deficit: Secondary | ICD-10-CM | POA: Diagnosis present

## 2021-01-31 DIAGNOSIS — W19XXXA Unspecified fall, initial encounter: Secondary | ICD-10-CM | POA: Diagnosis not present

## 2021-01-31 DIAGNOSIS — Z955 Presence of coronary angioplasty implant and graft: Secondary | ICD-10-CM | POA: Diagnosis not present

## 2021-01-31 DIAGNOSIS — S42422D Displaced comminuted supracondylar fracture without intercondylar fracture of left humerus, subsequent encounter for fracture with routine healing: Secondary | ICD-10-CM | POA: Diagnosis present

## 2021-01-31 DIAGNOSIS — Z888 Allergy status to other drugs, medicaments and biological substances status: Secondary | ICD-10-CM

## 2021-01-31 DIAGNOSIS — Z884 Allergy status to anesthetic agent status: Secondary | ICD-10-CM | POA: Diagnosis not present

## 2021-01-31 DIAGNOSIS — U071 COVID-19: Secondary | ICD-10-CM | POA: Diagnosis not present

## 2021-01-31 DIAGNOSIS — F1010 Alcohol abuse, uncomplicated: Secondary | ICD-10-CM | POA: Diagnosis present

## 2021-01-31 DIAGNOSIS — M6281 Muscle weakness (generalized): Secondary | ICD-10-CM | POA: Diagnosis present

## 2021-01-31 LAB — CBC WITH DIFFERENTIAL/PLATELET
Abs Immature Granulocytes: 0.05 10*3/uL (ref 0.00–0.07)
Basophils Absolute: 0 10*3/uL (ref 0.0–0.1)
Basophils Relative: 0 %
Eosinophils Absolute: 0.1 10*3/uL (ref 0.0–0.5)
Eosinophils Relative: 1 %
HCT: 38.8 % — ABNORMAL LOW (ref 39.0–52.0)
Hemoglobin: 13.1 g/dL (ref 13.0–17.0)
Immature Granulocytes: 1 %
Lymphocytes Relative: 9 %
Lymphs Abs: 1 10*3/uL (ref 0.7–4.0)
MCH: 34.9 pg — ABNORMAL HIGH (ref 26.0–34.0)
MCHC: 33.8 g/dL (ref 30.0–36.0)
MCV: 103.5 fL — ABNORMAL HIGH (ref 80.0–100.0)
Monocytes Absolute: 0.9 10*3/uL (ref 0.1–1.0)
Monocytes Relative: 8 %
Neutro Abs: 8.6 10*3/uL — ABNORMAL HIGH (ref 1.7–7.7)
Neutrophils Relative %: 81 %
Platelets: 151 10*3/uL (ref 150–400)
RBC: 3.75 MIL/uL — ABNORMAL LOW (ref 4.22–5.81)
RDW: 13.3 % (ref 11.5–15.5)
WBC: 10.6 10*3/uL — ABNORMAL HIGH (ref 4.0–10.5)
nRBC: 0 % (ref 0.0–0.2)

## 2021-01-31 LAB — COMPREHENSIVE METABOLIC PANEL
ALT: 18 U/L (ref 0–44)
AST: 26 U/L (ref 15–41)
Albumin: 3.9 g/dL (ref 3.5–5.0)
Alkaline Phosphatase: 96 U/L (ref 38–126)
Anion gap: 5 (ref 5–15)
BUN: 28 mg/dL — ABNORMAL HIGH (ref 8–23)
CO2: 26 mmol/L (ref 22–32)
Calcium: 8.8 mg/dL — ABNORMAL LOW (ref 8.9–10.3)
Chloride: 106 mmol/L (ref 98–111)
Creatinine, Ser: 1.46 mg/dL — ABNORMAL HIGH (ref 0.61–1.24)
GFR, Estimated: 46 mL/min — ABNORMAL LOW (ref >60)
Glucose, Bld: 181 mg/dL — ABNORMAL HIGH (ref 70–99)
Potassium: 4.5 mmol/L (ref 3.5–5.1)
Sodium: 137 mmol/L (ref 135–145)
Total Bilirubin: 1.2 mg/dL (ref 0.3–1.2)
Total Protein: 6.5 g/dL (ref 6.5–8.1)

## 2021-01-31 LAB — CK: Total CK: 133 U/L (ref 49–397)

## 2021-01-31 MED ORDER — ACETAMINOPHEN 650 MG RE SUPP: 650.0000 mg | Freq: Four times a day (QID) | RECTAL | Status: DC | PRN

## 2021-01-31 MED ORDER — SIMVASTATIN 10 MG PO TABS
20.0000 mg | ORAL_TABLET | Freq: Every day | ORAL | Status: DC
  Administered 2021-02-01 – 2021-02-06 (×6): 20 mg via ORAL
  Filled 2021-01-31: qty 1
  Filled 2021-01-31: qty 2
  Filled 2021-01-31: qty 1
  Filled 2021-01-31 (×3): qty 2

## 2021-01-31 MED ORDER — LEVOTHYROXINE SODIUM 50 MCG PO TABS
50.0000 ug | ORAL_TABLET | Freq: Every day | ORAL | Status: DC
  Administered 2021-02-02 – 2021-02-06 (×5): 50 ug via ORAL
  Filled 2021-01-31 (×7): qty 1

## 2021-01-31 MED ORDER — POLYETHYLENE GLYCOL 3350 17 G PO PACK
17.0000 g | PACK | Freq: Every day | ORAL | Status: DC | PRN
  Filled 2021-01-31 (×2): qty 1

## 2021-01-31 MED ORDER — HYDROMORPHONE HCL 1 MG/ML IJ SOLN
0.5000 mg | INTRAMUSCULAR | Status: DC | PRN
  Administered 2021-02-02: 0.5 mg via INTRAVENOUS
  Filled 2021-01-31: qty 0.5

## 2021-01-31 MED ORDER — ACETAMINOPHEN 325 MG PO TABS
650.0000 mg | ORAL_TABLET | Freq: Four times a day (QID) | ORAL | Status: DC | PRN
  Administered 2021-02-02 – 2021-02-05 (×6): 650 mg via ORAL
  Filled 2021-01-31 (×6): qty 2

## 2021-01-31 MED ORDER — HYDROCODONE-ACETAMINOPHEN 5-325 MG PO TABS
1.0000 | ORAL_TABLET | Freq: Four times a day (QID) | ORAL | Status: DC | PRN
  Administered 2021-02-01 – 2021-02-06 (×6): 1 via ORAL
  Filled 2021-01-31 (×6): qty 1

## 2021-01-31 MED ORDER — MELATONIN 5 MG PO TABS
10.0000 mg | ORAL_TABLET | Freq: Every evening | ORAL | Status: DC | PRN
  Administered 2021-02-03 – 2021-02-04 (×3): 10 mg via ORAL
  Filled 2021-01-31 (×3): qty 2

## 2021-01-31 MED ORDER — PANTOPRAZOLE SODIUM 40 MG PO TBEC
40.0000 mg | DELAYED_RELEASE_TABLET | Freq: Every day | ORAL | Status: DC
  Administered 2021-02-01 – 2021-02-06 (×6): 40 mg via ORAL
  Filled 2021-01-31 (×6): qty 1

## 2021-01-31 MED ORDER — MORPHINE SULFATE (PF) 4 MG/ML IV SOLN
4.0000 mg | Freq: Once | INTRAVENOUS | Status: AC
  Administered 2021-01-31: 4 mg via INTRAVENOUS
  Filled 2021-01-31: qty 1

## 2021-01-31 MED ORDER — TAMSULOSIN HCL 0.4 MG PO CAPS
0.4000 mg | ORAL_CAPSULE | Freq: Every day | ORAL | Status: DC
  Administered 2021-02-01 – 2021-02-06 (×6): 0.4 mg via ORAL
  Filled 2021-01-31 (×6): qty 1

## 2021-01-31 MED ORDER — OXYCODONE-ACETAMINOPHEN 5-325 MG PO TABS
1.0000 | ORAL_TABLET | Freq: Once | ORAL | Status: AC
Start: 2021-01-31 — End: 2021-01-31
  Administered 2021-01-31: 1 via ORAL
  Filled 2021-01-31: qty 1

## 2021-01-31 MED ORDER — SODIUM CHLORIDE 0.9% FLUSH
3.0000 mL | Freq: Two times a day (BID) | INTRAVENOUS | Status: DC
  Administered 2021-02-01 – 2021-02-02 (×4): 3 mL via INTRAVENOUS

## 2021-01-31 NOTE — H&P (Addendum)
History and Physical   Shawn Sanchez YYQ:[id number] DOB: Sep 17, 1931 DOA: 01/31/2021  PCP: [name], MD   Patient coming from: Home  Chief Complaint: Fall  HPI: Shawn Sanchez is a 88 y.o. male with medical history significant of TIA, radiculopathy status postlaminectomy, hypothyroidism, hyperlipidemia, GERD, hypertension, depression, anxiety, insomnia, dementia, CAD s/p angioplasty, anemia, alcohol use, asthma, BPH, CKD who presents after a fall at home.  Patient fell when walking to his mailbox injured his shoulder has significant pain at the shoulder and swelling noted.  He denies any other pain at this time.  He does state that he has not been taking most of his medications but is not sure when he stopped taking them so some of the changes were related to the pandemic.  He does appear to have confusion about this and other things.  He states he thinks he has been taking his thyroid medicine though.  He denies fevers, chills, chest pain, shortness of breath, abdominal pain, constipation, diarrhea, nausea, vomiting.   ED Course: Vital signs in the ED significant for blood pressure in the 120s to 160s systolic.  Lab work-up showed CMP with BUN 28, creatinine stable 1.46 and baseline around 1.3, glucose 181.  Calcium 8.8.  CBC with mild leukocytosis to 10.6.  CK level normal.  Respiratory panel for flu and COVID pending.  Chest x-ray showed mild vascular congestion versus bronchovascular crowding and a left humerus fracture.  Left shoulder x-ray showed left humerus fracture that was comminuted and displaced.  Left knee x-ray showed no acute normality and did demonstrate arthritis.  CT head and CT C-spine were without acute abnormality.  Patient received morphine and I see codon in the ED.  Orthopedics was consulted and said they will see the patient in consultation but as of now they think he is not likely to be an operative candidate.  Review of Systems: As per HPI otherwise all other  systems reviewed and are negative.  Past Medical History:  Diagnosis Date   Anemia, unspecified    Arthritis    likely rheumatoid   Carpal tunnel syndrome    Coronary atherosclerosis of unspecified type of vessel, native or graft    Dementia (HCC)    Depression    Esophageal reflux    Hemorrhage of rectum and anus    Hernia, inguinal    History of bronchitis    History of hiatal hernia    left inguinal    Hyperchylomicronemia    Hypothyroidism    Neuropathy of both feet    Pneumonia    in the past   Shortness of breath dyspnea    with exertion   TIA (transient ischemic attack)    Unspecified disorder of kidney and ureter    Unspecified essential hypertension     Past Surgical History:  Procedure Laterality Date   angioplasy/stent  2006   carpal tunnel release Bilateral    COLONOSCOPY     CORONARY ANGIOPLASTY     2 stents placed in 2006   INGUINAL HERNIA REPAIR Left 02/06/2016   Procedure: REPAIR LEFT INGUINAL HERNIA  ;  Surgeon: Darnell Level, MD;  Location: [facility];  Service: General;  Laterality: Left;   INSERTION OF MESH Left 02/06/2016   Procedure: INSERTION OF MESH;  Surgeon: Darnell Level, MD;  Location: [facility];  Service: General;  Laterality: Left;   lamenectomy  1965   LUMBAR LAMINECTOMY/DECOMPRESSION MICRODISCECTOMY N/A 09/27/2015   Procedure: Laminectomy and Foraminotomy - Lumbar three-four,  Lumbar  four-five;  Surgeon: Tia Alert, MD;  Location: [facility];  Service: Neurosurgery;  Laterality: N/A;   UPPER GASTROINTESTINAL ENDOSCOPY      Social History  reports that he quit smoking about 21 years ago. His smoking use included pipe and cigars. He has never used smokeless tobacco. He reports current alcohol use. He reports that he does not use drugs.  Allergies  Allergen Reactions   Epinephrine Other (See Comments)    WITH NOVOCAIN = CHEST PAIN   Procaine Other (See Comments)    WITH EPINEPHRINE CHEST PAIN Other reaction(s): Other WITH EPINEPHRINE CHEST  PAIN   Aricept [Donepezil]     Other reaction(s): Unknown   Levonordefrin [Nordefrin]     Other reaction(s): Unknown   Mepivacaine     Other reaction(s): Unknown   Other Hives and Other (See Comments)    Isocaine = "Mepivacaine"    Family History  Problem Relation Age of Onset   Breast cancer Mother    Lung cancer Mother    Diabetes Sister    Lung cancer Father    Coronary artery disease Father    Hypertension Father    Colon cancer Neg Hx   Reviewed on admission  Prior to Admission medications   Medication Sig Start Date End Date Taking? Authorizing Provider  cholecalciferol (VITAMIN D) 1000 UNITS tablet Take 5,000 Units by mouth daily. Reported on 07/01/2015    [provider]  clopidogrel (PLAVIX) 75 MG tablet Take 75 mg by mouth at bedtime. Patient not taking since 02/02/2016 09/03/13   [provider]  levothyroxine (SYNTHROID, LEVOTHROID) 50 MCG tablet Take 50 mcg by mouth daily before breakfast.    [provider]  Melatonin 5 MG CAPS Take 10 mg by mouth at bedtime as needed (sleep). Reported on 07/01/2015    [provider]  omeprazole (PRILOSEC) 20 MG capsule Take 20 mg by mouth every other day.  09/25/12   [provider]  simvastatin (ZOCOR) 20 MG tablet TAKE 1 TABLET(20 MG) BY MOUTH DAILY Patient taking differently: Take 20 mg by mouth daily. 02/28/20   Lewayne Bunting, MD  Tamsulosin HCl (FLOMAX) 0.4 MG CAPS Take 0.4 mg by mouth daily.  07/20/11   [provider]    Physical Exam: Vitals:   01/31/21 2032 01/31/21 2300 01/31/21 2330 02/01/21 0000  BP: (!) 126/113 (!) 167/88 (!) 136/124 130/73  Pulse:  63 75   Resp:  20 (!) 22   Temp:      TempSrc:      SpO2:  99% 99%    Physical Exam Constitutional:      General: He is not in acute distress.    Appearance: Normal appearance.  HENT:     Head: Normocephalic and atraumatic.     Mouth/Throat:     Mouth: Mucous membranes are moist.     Pharynx: Oropharynx is  clear.  Eyes:     Extraocular Movements: Extraocular movements intact.     Pupils: Pupils are equal, round, and reactive to light.  Cardiovascular:     Rate and Rhythm: Normal rate and regular rhythm.     Pulses: Normal pulses.     Heart sounds: Normal heart sounds.  Pulmonary:     Effort: Pulmonary effort is normal. No respiratory distress.     Breath sounds: Normal breath sounds.  Abdominal:     General: Bowel sounds are normal. There is no distension.     Palpations: Abdomen is soft.  Tenderness: There is no abdominal tenderness.  Musculoskeletal:        General: Swelling, tenderness, deformity and signs of injury present.     Comments: Left shoulder tenderness with hematoma noted.  Sling in place.  Skin:    General: Skin is warm and dry.  Neurological:     General: No focal deficit present.     Comments: Appears to have a degree of confusion.  Dementia is listed in his chart.   Labs on Admission: I have personally reviewed following labs and imaging studies  CBC: Recent Labs  Lab 01/31/21 2158  WBC 10.6*  NEUTROABS 8.6*  HGB 13.1  HCT 38.8*  MCV 103.5*  PLT 151    Basic Metabolic Panel: Recent Labs  Lab 01/31/21 2158  NA 137  K 4.5  CL 106  CO2 26  GLUCOSE 181*  BUN 28*  CREATININE 1.46*  CALCIUM 8.8*    GFR: CrCl cannot be calculated (Unknown ideal weight.).  Liver Function Tests: Recent Labs  Lab 01/31/21 2158  AST 26  ALT 18  ALKPHOS 96  BILITOT 1.2  PROT 6.5  ALBUMIN 3.9    Urine analysis:    Component Value Date/Time   COLORURINE YELLOW 03/20/2020 1734   APPEARANCEUR HAZY (A) 03/20/2020 1734   LABSPEC 1.017 03/20/2020 1734   PHURINE 5.0 03/20/2020 1734   GLUCOSEU NEGATIVE 03/20/2020 1734   HGBUR NEGATIVE 03/20/2020 1734   BILIRUBINUR NEGATIVE 03/20/2020 1734   KETONESUR NEGATIVE 03/20/2020 1734   PROTEINUR NEGATIVE 03/20/2020 1734   UROBILINOGEN 0.2 06/17/2010 1608   NITRITE NEGATIVE 03/20/2020 1734   LEUKOCYTESUR NEGATIVE  03/20/2020 1734    Radiological Exams on Admission: DG Chest 2 View  Result Date: 01/31/2021 CLINICAL DATA:  Post fall with chest and left arm pain. EXAM: CHEST - 2 VIEW COMPARISON:  02/05/2016 FINDINGS: Upper normal heart size. Aortic atherosclerosis and tortuosity. Vascular congestion versus bronchovascular crowding. Left proximal humerus fracture, better assessed on concurrent shoulder exam. No pneumothorax or pleural effusion. No visualized rib fracture. IMPRESSION: 1. Mild vascular congestion versus bronchovascular crowding. 2. Left proximal humerus fracture, better assessed on concurrent shoulder exam. Electronically Signed   By: Narda RutherfordMelanie  Sanford M.D.   On: 01/31/2021 21:05   CT HEAD WO CONTRAST (5MM)  Result Date: 01/31/2021 CLINICAL DATA:  Trauma. EXAM: CT HEAD WITHOUT CONTRAST CT CERVICAL SPINE WITHOUT CONTRAST TECHNIQUE: Multidetector CT imaging of the head and cervical spine was performed following the standard protocol without intravenous contrast. Multiplanar CT image reconstructions of the cervical spine were also generated. RADIATION DOSE REDUCTION: This exam was performed according to the departmental dose-optimization program which includes automated exposure control, adjustment of the mA and/or kV according to patient size and/or use of iterative reconstruction technique. COMPARISON:  MRI brain 04/20/2020. FINDINGS: CT HEAD FINDINGS Brain: No evidence of acute infarction, hemorrhage, hydrocephalus, extra-axial collection or mass lesion/mass effect. There is stable mild diffuse atrophy with compensatory dilatation of the ventricular system. There is stable mild periventricular white matter hypodensity, likely chronic small vessel ischemic change. Vascular: Atherosclerotic calcifications are present within the cavernous internal carotid arteries. Skull: Normal. Negative for fracture or focal lesion. Sinuses/Orbits: There is mucosal thickening of the ethmoid air cells, right frontal sinus  and bilateral maxillary sinuses. Mastoid air cells are clear. Orbits are grossly within normal limits. Other: None. CT CERVICAL SPINE FINDINGS Alignment: Normal. Skull base and vertebrae: No acute fracture. No primary bone lesion or focal pathologic process. Bones are diffusely osteopenic. Soft tissues and spinal canal: No  prevertebral fluid or swelling. No visible canal hematoma. Disc levels: There is intervertebral disc space narrowing and endplate osteophyte formation throughout the cervical spine compatible with degenerative change. This is most significant at C5-C6 and C6-C7. There is no severe central canal or neural foraminal stenosis at any level. There is some mild to moderate central canal stenosis at C6-C7 secondary to posterior disc osteophyte complex. There is calcified pannus formation at the level of C2. Upper chest: Negative. Other: None. IMPRESSION: 1.  No acute intracranial process. 2. No acute fracture or traumatic subluxation of the cervical spine. Electronically Signed   By: Darliss CheneyAmy  Guttmann M.D.   On: 01/31/2021 21:17   CT Cervical Spine Wo Contrast  Result Date: 01/31/2021 CLINICAL DATA:  Trauma. EXAM: CT HEAD WITHOUT CONTRAST CT CERVICAL SPINE WITHOUT CONTRAST TECHNIQUE: Multidetector CT imaging of the head and cervical spine was performed following the standard protocol without intravenous contrast. Multiplanar CT image reconstructions of the cervical spine were also generated. RADIATION DOSE REDUCTION: This exam was performed according to the departmental dose-optimization program which includes automated exposure control, adjustment of the mA and/or kV according to patient size and/or use of iterative reconstruction technique. COMPARISON:  MRI brain 04/20/2020. FINDINGS: CT HEAD FINDINGS Brain: No evidence of acute infarction, hemorrhage, hydrocephalus, extra-axial collection or mass lesion/mass effect. There is stable mild diffuse atrophy with compensatory dilatation of the ventricular  system. There is stable mild periventricular white matter hypodensity, likely chronic small vessel ischemic change. Vascular: Atherosclerotic calcifications are present within the cavernous internal carotid arteries. Skull: Normal. Negative for fracture or focal lesion. Sinuses/Orbits: There is mucosal thickening of the ethmoid air cells, right frontal sinus and bilateral maxillary sinuses. Mastoid air cells are clear. Orbits are grossly within normal limits. Other: None. CT CERVICAL SPINE FINDINGS Alignment: Normal. Skull base and vertebrae: No acute fracture. No primary bone lesion or focal pathologic process. Bones are diffusely osteopenic. Soft tissues and spinal canal: No prevertebral fluid or swelling. No visible canal hematoma. Disc levels: There is intervertebral disc space narrowing and endplate osteophyte formation throughout the cervical spine compatible with degenerative change. This is most significant at C5-C6 and C6-C7. There is no severe central canal or neural foraminal stenosis at any level. There is some mild to moderate central canal stenosis at C6-C7 secondary to posterior disc osteophyte complex. There is calcified pannus formation at the level of C2. Upper chest: Negative. Other: None. IMPRESSION: 1.  No acute intracranial process. 2. No acute fracture or traumatic subluxation of the cervical spine. Electronically Signed   By: Darliss CheneyAmy  Guttmann M.D.   On: 01/31/2021 21:17   DG Shoulder Left  Result Date: 01/31/2021 CLINICAL DATA:  Post fall with left arm pain. EXAM: LEFT SHOULDER - 2+ VIEW COMPARISON:  None. FINDINGS: Comminuted fracture of the proximal humerus. Fracture involves the surgical neck and lateral humeral head. There is displacement of greater than 1 cm. No convincing intra-articular involvement. No humeral head dislocation. Acromioclavicular degenerative change. IMPRESSION: Comminuted and displaced proximal humerus fracture involving the surgical neck and lateral humeral head.  Electronically Signed   By: Narda RutherfordMelanie  Sanford M.D.   On: 01/31/2021 21:06   DG Knee Complete 4 Views Left  Result Date: 01/31/2021 CLINICAL DATA:  Post fall with left knee pain. EXAM: LEFT KNEE - COMPLETE 4+ VIEW COMPARISON:  None. FINDINGS: No fracture or dislocation. Small knee joint effusion. Mild tricompartmental osteoarthritis. There is prominent chondrocalcinosis. Advanced vascular calcifications. IMPRESSION: 1. No fracture or dislocation of the left knee. 2. Mild  tricompartmental osteoarthritis and chondrocalcinosis. 3. Small knee joint effusion. Electronically Signed   By: Narda Rutherford M.D.   On: 01/31/2021 21:07    EKG: Independently reviewed.  This rhythm at 61 bpm.  PAC noted.  Nonspecific interventricular conduction delay.  Nonspecific T wave flattening as well.  Assessment/Plan Principal Problem:   Humerus fracture Active Problems:   HLD (hyperlipidemia)   Essential hypertension   CAD, NATIVE VESSEL   GERD   History of TIA (transient ischemic attack)   Hypothyroidism   Benign prostatic hyperplasia with lower urinary tract symptoms  Humerus fracture > Patient presented after a fall when walking to his mailbox.  He injured his shoulder which was painful and swollen. > Imaging in ED showed humerus fracture that was displaced and comminuted.  No other Acute injury noted on chest x-ray, left shoulder x-ray, left knee x-ray, CT head, CT C-spine. > Orthopedics consulted in the ED and will see the patient.  On first review they did not think patient was likely to undergo surgery. - Monitor on telemetry - Appreciate orthopedic recommendations - Continue with as needed Tylenol for mild pain, hydrocodone for moderate pain, Dilaudid for severe pain - N.p.o. except sips with meds in case patient has reconsidered for surgery. - We will hold home Plavix and do SCDs for DVT prophylaxis in case orthopedics decides on surgery - Continue with sling placed in ED - PT and OT eval    Hypertension > Not currently on any antihypertensives per my chart review.  Blood pressure was elevated in ED however this is in the setting of significant pain.  Hyperlipidemia CAD Hx of TIA > History of CAD with angioplasty. - Holding home Plavix as above - Continue home statin  CKD > Creatinine stable at 1.46 from previous of 1.3. - Avoid nephrotoxic agents - Trend renal function and electrolytes  BPH - Continue home tamsulosin  GERD - Continue home PPI  Hypothyroidism - Continue home Synthroid - Check TSH given uncertainty around his medications as per HPI  Insomnia - Continue home as needed melatonin  Dementia > Diagnosis of dementia listed in chart. > Patient with apparent confusion and has been taking his medications consistently. - May need further evaluation for appropriateness of living alone.  DVT prophylaxis: SCDs  Code Status:   Full  Family Communication:  None on admission.  Patient states he does not have any family.  He states that his neighbor, Eunice Blase, is his contact person and she has been informed.  Her number is listed on a piece of paper he has and is [phone]   I note that there is a daughter, Shawna Orleans listed in the chart.  I asked him about this and he verified that his daughter passed away in 05/07/22. Disposition Plan:   Patient is from:  Home  Anticipated DC to:  Home  Anticipated DC date:  1 to 4 days  Anticipated DC barriers: None  Consults called:  Orthopedics consulted by EDP and will see the patient. Admission status:  Observation, telemetry   Severity of Illness: The appropriate patient status for this patient is OBSERVATION. Observation status is judged to be reasonable and necessary in order to provide the required intensity of service to ensure the patient's safety. The patient's presenting symptoms, physical exam findings, and initial radiographic and laboratory data in the context of their medical condition is felt to place them  at decreased risk for further clinical deterioration. Furthermore, it is anticipated that the patient will be medically  stable for discharge from the hospital within 2 midnights of admission.    Synetta Fail MD [facility]  How to contact the [facility] Attending or Consulting provider 7A - 7P or covering provider during after hours 7P -7A, for this patient?   Check the care team in [facility] and look for a) attending/consulting [facility] provider listed and b) the [facility] team listed Log into [email or web address] and use [employer]'s universal password to access. If you do not have the password, please contact the hospital operator. Locate the [facility] provider you are looking for under [facility] and page to a number that you can be directly reached. If you still have difficulty reaching the provider, please page the [facility] (Director on Call) for the Hospitalists listed on amion for assistance.  02/01/2021, 12:09 AM

## 2021-01-31 NOTE — ED Notes (Signed)
Consult to Orthopedic Surgery@23 :16pm.

## 2021-01-31 NOTE — ED Triage Notes (Signed)
Pt BIB EMS, pt was walking to his mailbox and fell injuring his left shoulder x 1 hr ago.

## 2021-01-31 NOTE — ED Provider Triage Note (Signed)
Emergency Medicine Provider Triage Evaluation Note  Jourdain Vielman [id number] , a 88 y.o. male  was evaluated in triage.  Pt complains of left shoulder pain that is severe achy and constant. States he fell because "my feet got ahead of me" today while walking in front of his house.   Hit his head but no LOC or n/v Left shoulder pain primarily  Review of Systems  Positive: Left shoulder pain, fall Negative: Fever, syncope  Physical Exam  There were no vitals taken for this visit. Gen:   Awake, no distress  Resp:  Normal effort  MSK:   Moves RUE/BL lower exts Other:  Left shoulder with swelling/deformity at the shoulder. Radial pulses 3+. Sensation intact. Able to dorsiflex wrist/extend wrist. Left knee abrasions. Some mild TTP of sternum no bruising. Small lac to left forehead. No C/T/L spine TTP.   Medical Decision Making  Medically screening exam initiated at 8:30 PM.  Appropriate orders placed.  Pasqualino Afridi Quiles was informed that the remainder of the evaluation will be completed by another provider, this initial triage assessment does not replace that evaluation, and the importance of remaining in the ED until their evaluation is complete.  Labs/images ordered.     Pati Gallo [place], [place] 01/31/21 2034

## 2021-01-31 NOTE — ED Provider Notes (Signed)
[facility]-EMERGENCY DEPT Provider Note   CSN: [id number] Arrival date & time: 01/31/21  2004     History  Chief Complaint  Patient presents with   Fall   Shoulder Injury    SUHAAS AGENA is a 88 y.o. male.   Fall  Shoulder Injury  Patient is an 88 year old gentleman with past medical history significant for neuropathy, dementia, anemia, TIA, arthritis, carpal tunnel Notably on Plavix  Patient presents emergency room today with left shoulder pain some left knee pain and small cut to her left forehead after he had a mechanical fall and laid on the ground for an hour accelerates hours before brought in by EMS.  States the pain in her shoulder is severe constant and worsening.  She states that swelling has progressively worsened since he came to the ER.     Home Medications Prior to Admission medications   Medication Sig Start Date End Date Taking? Authorizing Provider  cholecalciferol (VITAMIN D) 1000 UNITS tablet Take 5,000 Units by mouth daily. Reported on 07/01/2015    [provider]  clopidogrel (PLAVIX) 75 MG tablet Take 75 mg by mouth at bedtime. Patient not taking since 02/02/2016 09/03/13   [provider]  levothyroxine (SYNTHROID, LEVOTHROID) 50 MCG tablet Take 50 mcg by mouth daily before breakfast.    [provider]  Melatonin 5 MG CAPS Take 10 mg by mouth at bedtime as needed (sleep). Reported on 07/01/2015    [provider]  omeprazole (PRILOSEC) 20 MG capsule Take 20 mg by mouth every other day.  09/25/12   [provider]  simvastatin (ZOCOR) 20 MG tablet TAKE 1 TABLET(20 MG) BY MOUTH DAILY Patient taking differently: Take 20 mg by mouth daily. 02/28/20   Lewayne Bunting, MD  Tamsulosin HCl (FLOMAX) 0.4 MG CAPS Take 0.4 mg by mouth daily.  07/20/11   [provider]      Allergies    Epinephrine, Procaine, Aricept [donepezil], Levonordefrin [nordefrin], Mepivacaine, and Other     Review of Systems   Review of Systems  Musculoskeletal:  Positive for arthralgias.  Skin:  Positive for wound.   Physical Exam Updated Vital Signs BP 130/73    Pulse 75    Temp 97.7 F (36.5 C) (Oral)    Resp (!) 22    SpO2 99%  Physical Exam Vitals and nursing note reviewed.  Constitutional:      General: He is in acute distress.     Comments: Severely uncomfortable 88 year old gentleman  HENT:     Head: Normocephalic.     Comments: Small superficial abrasion/scrape to left forehead.    Nose: Nose normal.     Mouth/Throat:     Mouth: Mucous membranes are moist.  Eyes:     General: No scleral icterus. Cardiovascular:     Rate and Rhythm: Normal rate and regular rhythm.     Pulses: Normal pulses.     Heart sounds: Normal heart sounds.  Pulmonary:     Effort: Pulmonary effort is normal. No respiratory distress.     Breath sounds: No wheezing.  Abdominal:     Palpations: Abdomen is soft.     Tenderness: There is no abdominal tenderness. There is no guarding or rebound.  Musculoskeletal:     Cervical back: Normal range of motion.     Right lower leg: No edema.     Left lower leg: No edema.     Comments: Significant swelling to left proximal humerus. Distal  pulses intact Abrasion and diffuse tenderness about the knee.  No other bony tenderness over joints or long bones of the upper and lower extremities.    No neck or back midline tenderness, step-off, deformity, or bruising. Able to turn head left and right 45 degrees without difficulty.  Full range of motion of upper and lower extremity joints shown after palpation was conducted; with 5/5 symmetrical strength in upper and lower extremities. No chest wall tenderness, no facial or cranial tenderness.   Patient has intact sensation grossly in lower and upper extremities. Intact patellar and ankle reflexes. Patient able to ambulate without difficulty.  Radial and DP pulses palpated BL.     Skin:    General: Skin is warm  and dry.     Capillary Refill: Capillary refill takes less than 2 seconds.  Neurological:     Mental Status: He is alert. Mental status is at baseline.     Comments: Alert and oriented x3    Psychiatric:        Mood and Affect: Mood normal.        Behavior: Behavior normal.    ED Results / Procedures / Treatments   Labs (all labs ordered are listed, but only abnormal results are displayed) Labs Reviewed  CBC WITH DIFFERENTIAL/PLATELET - Abnormal; Notable for the following components:      Result Value   WBC 10.6 (*)    RBC 3.75 (*)    HCT 38.8 (*)    MCV 103.5 (*)    MCH 34.9 (*)    Neutro Abs 8.6 (*)    All other components within normal limits  COMPREHENSIVE METABOLIC PANEL - Abnormal; Notable for the following components:   Glucose, Bld 181 (*)    BUN 28 (*)    Creatinine, Ser 1.46 (*)    Calcium 8.8 (*)    GFR, Estimated 46 (*)    All other components within normal limits  RESP PANEL BY RT-PCR (FLU A&B, COVID) [id number]  CK  COMPREHENSIVE METABOLIC PANEL  CBC  PROTIME-INR    EKG EKG Interpretation  Date/Time:  Wednesday January 31 2021 22:58:29 EST Ventricular Rate:  61 PR Interval:  195 QRS Duration: 121 QT Interval:  439 QTC Calculation: 443 R Axis:   42 Text Interpretation: Sinus rhythm Atrial premature complex Nonspecific intraventricular conduction delay Inferior infarct, old Confirmed by Zadie RhineWickline, Donald ([id number]) on 01/31/2021 11:14:03 PM  Radiology DG Chest 2 View  Result Date: 01/31/2021 CLINICAL DATA:  Post fall with chest and left arm pain. EXAM: CHEST - 2 VIEW COMPARISON:  02/05/2016 FINDINGS: Upper normal heart size. Aortic atherosclerosis and tortuosity. Vascular congestion versus bronchovascular crowding. Left proximal humerus fracture, better assessed on concurrent shoulder exam. No pneumothorax or pleural effusion. No visualized rib fracture. IMPRESSION: 1. Mild vascular congestion versus bronchovascular crowding. 2. Left proximal humerus fracture,  better assessed on concurrent shoulder exam. Electronically Signed   By: Narda RutherfordMelanie  Sanford M.D.   On: 01/31/2021 21:05   CT HEAD WO CONTRAST (5MM)  Result Date: 01/31/2021 CLINICAL DATA:  Trauma. EXAM: CT HEAD WITHOUT CONTRAST CT CERVICAL SPINE WITHOUT CONTRAST TECHNIQUE: Multidetector CT imaging of the head and cervical spine was performed following the standard protocol without intravenous contrast. Multiplanar CT image reconstructions of the cervical spine were also generated. RADIATION DOSE REDUCTION: This exam was performed according to the departmental dose-optimization program which includes automated exposure control, adjustment of the mA and/or kV according to patient size and/or use of iterative reconstruction technique. COMPARISON:  MRI  brain 04/20/2020. FINDINGS: CT HEAD FINDINGS Brain: No evidence of acute infarction, hemorrhage, hydrocephalus, extra-axial collection or mass lesion/mass effect. There is stable mild diffuse atrophy with compensatory dilatation of the ventricular system. There is stable mild periventricular white matter hypodensity, likely chronic small vessel ischemic change. Vascular: Atherosclerotic calcifications are present within the cavernous internal carotid arteries. Skull: Normal. Negative for fracture or focal lesion. Sinuses/Orbits: There is mucosal thickening of the ethmoid air cells, right frontal sinus and bilateral maxillary sinuses. Mastoid air cells are clear. Orbits are grossly within normal limits. Other: None. CT CERVICAL SPINE FINDINGS Alignment: Normal. Skull base and vertebrae: No acute fracture. No primary bone lesion or focal pathologic process. Bones are diffusely osteopenic. Soft tissues and spinal canal: No prevertebral fluid or swelling. No visible canal hematoma. Disc levels: There is intervertebral disc space narrowing and endplate osteophyte formation throughout the cervical spine compatible with degenerative change. This is most significant at C5-C6  and C6-C7. There is no severe central canal or neural foraminal stenosis at any level. There is some mild to moderate central canal stenosis at C6-C7 secondary to posterior disc osteophyte complex. There is calcified pannus formation at the level of C2. Upper chest: Negative. Other: None. IMPRESSION: 1.  No acute intracranial process. 2. No acute fracture or traumatic subluxation of the cervical spine. Electronically Signed   By: Darliss Cheney M.D.   On: 01/31/2021 21:17   CT Cervical Spine Wo Contrast  Result Date: 01/31/2021 CLINICAL DATA:  Trauma. EXAM: CT HEAD WITHOUT CONTRAST CT CERVICAL SPINE WITHOUT CONTRAST TECHNIQUE: Multidetector CT imaging of the head and cervical spine was performed following the standard protocol without intravenous contrast. Multiplanar CT image reconstructions of the cervical spine were also generated. RADIATION DOSE REDUCTION: This exam was performed according to the departmental dose-optimization program which includes automated exposure control, adjustment of the mA and/or kV according to patient size and/or use of iterative reconstruction technique. COMPARISON:  MRI brain 04/20/2020. FINDINGS: CT HEAD FINDINGS Brain: No evidence of acute infarction, hemorrhage, hydrocephalus, extra-axial collection or mass lesion/mass effect. There is stable mild diffuse atrophy with compensatory dilatation of the ventricular system. There is stable mild periventricular white matter hypodensity, likely chronic small vessel ischemic change. Vascular: Atherosclerotic calcifications are present within the cavernous internal carotid arteries. Skull: Normal. Negative for fracture or focal lesion. Sinuses/Orbits: There is mucosal thickening of the ethmoid air cells, right frontal sinus and bilateral maxillary sinuses. Mastoid air cells are clear. Orbits are grossly within normal limits. Other: None. CT CERVICAL SPINE FINDINGS Alignment: Normal. Skull base and vertebrae: No acute fracture. No primary  bone lesion or focal pathologic process. Bones are diffusely osteopenic. Soft tissues and spinal canal: No prevertebral fluid or swelling. No visible canal hematoma. Disc levels: There is intervertebral disc space narrowing and endplate osteophyte formation throughout the cervical spine compatible with degenerative change. This is most significant at C5-C6 and C6-C7. There is no severe central canal or neural foraminal stenosis at any level. There is some mild to moderate central canal stenosis at C6-C7 secondary to posterior disc osteophyte complex. There is calcified pannus formation at the level of C2. Upper chest: Negative. Other: None. IMPRESSION: 1.  No acute intracranial process. 2. No acute fracture or traumatic subluxation of the cervical spine. Electronically Signed   By: Darliss Cheney M.D.   On: 01/31/2021 21:17   DG Shoulder Left  Result Date: 01/31/2021 CLINICAL DATA:  Post fall with left arm pain. EXAM: LEFT SHOULDER - 2+ VIEW COMPARISON:  None. FINDINGS: Comminuted fracture of the proximal humerus. Fracture involves the surgical neck and lateral humeral head. There is displacement of greater than 1 cm. No convincing intra-articular involvement. No humeral head dislocation. Acromioclavicular degenerative change. IMPRESSION: Comminuted and displaced proximal humerus fracture involving the surgical neck and lateral humeral head. Electronically Signed   By: Narda RutherfordMelanie  Sanford M.D.   On: 01/31/2021 21:06   DG Knee Complete 4 Views Left  Result Date: 01/31/2021 CLINICAL DATA:  Post fall with left knee pain. EXAM: LEFT KNEE - COMPLETE 4+ VIEW COMPARISON:  None. FINDINGS: No fracture or dislocation. Small knee joint effusion. Mild tricompartmental osteoarthritis. There is prominent chondrocalcinosis. Advanced vascular calcifications. IMPRESSION: 1. No fracture or dislocation of the left knee. 2. Mild tricompartmental osteoarthritis and chondrocalcinosis. 3. Small knee joint effusion. Electronically  Signed   By: Narda RutherfordMelanie  Sanford M.D.   On: 01/31/2021 21:07    Procedures Procedures    Medications Ordered in ED Medications  levothyroxine (SYNTHROID) tablet 50 mcg (has no administration in time range)  simvastatin (ZOCOR) tablet 20 mg (has no administration in time range)  pantoprazole (PROTONIX) EC tablet 40 mg (has no administration in time range)  tamsulosin (FLOMAX) capsule 0.4 mg (has no administration in time range)  Melatonin CAPS 10 mg (has no administration in time range)  sodium chloride flush (NS) 0.9 % injection 3 mL (has no administration in time range)  acetaminophen (TYLENOL) tablet 650 mg (has no administration in time range)    Or  acetaminophen (TYLENOL) suppository 650 mg (has no administration in time range)  polyethylene glycol (MIRALAX / GLYCOLAX) packet 17 g (has no administration in time range)  HYDROcodone-acetaminophen (NORCO/VICODIN) 5-325 MG per tablet 1 tablet (has no administration in time range)  HYDROmorphone (DILAUDID) injection 0.5 mg (has no administration in time range)  oxyCODONE-acetaminophen (PERCOCET/ROXICET) 5-325 MG per tablet 1 tablet (1 tablet Oral Given 01/31/21 2120)  morphine 4 MG/ML injection 4 mg (4 mg Intravenous Given 01/31/21 2316)    ED Course/ Medical Decision Making/ A&P Clinical Course as of 02/01/21 0002  Wed Jan 31, 2021  2329 Discussed with Dr. Ave Filterhandler of orthopedist service. Likely non-operative management. Will see patient in hospital if admitted. Will consult hospitalist for admission.  [WF]    Clinical Course User Index [WF] Gailen ShelterFondaw, Cledith Kamiya S, PA                           Medical Decision Making  Patient is an [age] year old gentleman presented emergency room today after mechanical fall he states that his feet had at home.  He fell onto his left shoulder  He is primarily complaining of left shoulder pain from a trauma assessment he has some abrasions and tenderness to his left knee and swelling significant hematoma to  his left proximal humerus suspicion for fracture.  Distally neurovascularly intact in all 4 extremities.  Mentating well but seems to have some mild dementia.  Ambulates with a walker/cane at baseline he is right-handed.  Has no family or significant other and in terms of social determinants of health seems to have poor resources.  CBC with no anemia.  CMP unremarkable.  CK within normal limits.  COVID influenza unremarkable/negative.  CT head, C-spine, plain film of left knee left shoulder and chest.  I reviewed all imaging.  CT head and C-spine unremarkable I have low suspicion for intercranial or cervical spine abnormality.  Left knee x-ray with some small effusion potentially from the knee  trauma however he is moving the well and has no instability doubt ligamentous injury of note.  Chest x-ray unremarkable.  Left shoulder however shows displaced comminuted fracture of the proximal humerus.  Will place in sling immobilizer Percocet has had very little effect on his pain.  We will provide patient with IV morphine and discussed with orthopedics.  Discussed with hospitalist who will admit.  Appreciate Dr. Alinda Money and his evaluation of patient.  Final Clinical Impression(s) / ED Diagnoses Final diagnoses:  Other closed displaced fracture of proximal end of left humerus, initial encounter    Rx / DC Orders ED Discharge Orders     None         Gailen Shelter, [place] 02/01/21 0002    Cheryll Cockayne, MD 02/04/21 2215

## 2021-02-01 DIAGNOSIS — E039 Hypothyroidism, unspecified: Secondary | ICD-10-CM | POA: Diagnosis not present

## 2021-02-01 DIAGNOSIS — I10 Essential (primary) hypertension: Secondary | ICD-10-CM | POA: Diagnosis not present

## 2021-02-01 DIAGNOSIS — N401 Enlarged prostate with lower urinary tract symptoms: Secondary | ICD-10-CM | POA: Diagnosis not present

## 2021-02-01 DIAGNOSIS — S42212A Unspecified displaced fracture of surgical neck of left humerus, initial encounter for closed fracture: Secondary | ICD-10-CM | POA: Diagnosis not present

## 2021-02-01 LAB — COMPREHENSIVE METABOLIC PANEL
ALT: 19 U/L (ref 0–44)
AST: 30 U/L (ref 15–41)
Albumin: 3.7 g/dL (ref 3.5–5.0)
Alkaline Phosphatase: 88 U/L (ref 38–126)
Anion gap: 8 (ref 5–15)
BUN: 31 mg/dL — ABNORMAL HIGH (ref 8–23)
CO2: 25 mmol/L (ref 22–32)
Calcium: 8.8 mg/dL — ABNORMAL LOW (ref 8.9–10.3)
Chloride: 104 mmol/L (ref 98–111)
Creatinine, Ser: 1.44 mg/dL — ABNORMAL HIGH (ref 0.61–1.24)
GFR, Estimated: 47 mL/min — ABNORMAL LOW (ref >60)
Glucose, Bld: 173 mg/dL — ABNORMAL HIGH (ref 70–99)
Potassium: 4.5 mmol/L (ref 3.5–5.1)
Sodium: 137 mmol/L (ref 135–145)
Total Bilirubin: 1.6 mg/dL — ABNORMAL HIGH (ref 0.3–1.2)
Total Protein: 6.4 g/dL — ABNORMAL LOW (ref 6.5–8.1)

## 2021-02-01 LAB — RESP PANEL BY RT-PCR (FLU A&B, COVID) ARPGX2
Influenza A by PCR: NEGATIVE
Influenza B by PCR: NEGATIVE
SARS Coronavirus 2 by RT PCR: NEGATIVE

## 2021-02-01 LAB — CBC
HCT: 37.1 % — ABNORMAL LOW (ref 39.0–52.0)
Hemoglobin: 12.8 g/dL — ABNORMAL LOW (ref 13.0–17.0)
MCH: 35.9 pg — ABNORMAL HIGH (ref 26.0–34.0)
MCHC: 34.5 g/dL (ref 30.0–36.0)
MCV: 103.9 fL — ABNORMAL HIGH (ref 80.0–100.0)
Platelets: 146 10*3/uL — ABNORMAL LOW (ref 150–400)
RBC: 3.57 MIL/uL — ABNORMAL LOW (ref 4.22–5.81)
RDW: 13.3 % (ref 11.5–15.5)
WBC: 11 10*3/uL — ABNORMAL HIGH (ref 4.0–10.5)
nRBC: 0 % (ref 0.0–0.2)

## 2021-02-01 LAB — PROTIME-INR
INR: 1.2 (ref 0.8–1.2)
Prothrombin Time: 14.8 seconds (ref 11.4–15.2)

## 2021-02-01 NOTE — Progress Notes (Signed)
Occupational Therapy Evaluation  Patient is unreliable narrator, confused unable to recall he is in the hospital even after being told ~5 minutes prior. Patient providing vague details regarding fall and keeps stating his arm hurts but having difficulty adhering to precautions. Patient needing maximal assistance for UB/LB dressing and +2 assist for safety for transfers/ambulation. Patient reporting dizziness with standing and had to sit multiple times while trying to ambulate ~110ft to head of bed. Also tries walking out room door needing max cues to redirect. Unsure of any help patient has however due to cognition anticipate patient will need to transition to long term care unless family can provide increased support/supervision.    02/01/21 1410  OT Visit Information  Last OT Received On 02/01/21  Assistance Needed +2 (safety)  PT/OT/SLP Co-Evaluation/Treatment Yes  Reason for Co-Treatment For patient/therapist safety;To address functional/ADL transfers  PT goals addressed during session Mobility/safety with mobility  OT goals addressed during session ADL's and self-care  History of Present Illness Patient is an 88 year old male that fell resulting in a L humerus fracture. PMH includes TIA, radiculopathy status post laminectomy, hypothyroidism, essential hypertension, CAD status post angioplasty, anemia, alcohol abuse  Precautions  Precautions Shoulder  Shoulder Interventions Shoulder sling/immobilizer;At all times  Precaution Comments sling at all times except for bathing and dressing. He can perform gentle ROM of hand, wrist, and elbow  Required Braces or Orthoses Sling  Restrictions  Weight Bearing Restrictions Yes  LUE Weight Bearing NWB  Home Living  Family/patient expects to be discharged to: Private residence  Additional Comments Patient is unreliable historian, unsure of home set up  Prior Function  Prior Level of Function  History of Falls (last six months);Patient poor  historian/Family not available  ADLs Comments Unsure of how much assistance patient needed for self care at baseline although does sound as if he lives alone.  Communication  Communication No difficulties  Pain Assessment  Pain Assessment 0-10  Pain Score 5  Pain Location L shoulder/arm  Pain Descriptors / Indicators Guarding;Aching;Discomfort  Pain Intervention(s) Monitored during session  Cognition  Arousal/Alertness Awake/alert  Behavior During Therapy WFL for tasks assessed/performed  Overall Cognitive Status No family/caregiver present to determine baseline cognitive functioning  General Comments Patient is unable to recall he is in the hospital even after being told. RN informed us he was seeing a cat in his room this morning.  Upper Extremity Assessment  Upper Extremity Assessment LUE deficits/detail  LUE Unable to fully assess due to pain;Unable to fully assess due to immobilization;Shoulder pain at rest  Lower Extremity Assessment  Lower Extremity Assessment Defer to PT evaluation  ADL  Overall ADL's  Needs assistance/impaired  Eating/Feeding Set up;Sitting;Bed level  Grooming Set up;Sitting;Bed level  Upper Body Bathing Moderate assistance;Sitting  Lower Body Bathing Maximal assistance;Sit to/from stand;Sitting/lateral leans  Upper Body Dressing  Maximal assistance;Sitting  Upper Body Dressing Details (indicate cue type and reason) Patient needing maximal assistance to don gown and cues not to move L UE  Lower Body Dressing Maximal assistance;Sit to/from stand;Sitting/lateral leans  Toilet Transfer +2 for safety/equipment;+2 for physical assistance;Minimal assistance  Toilet Transfer Details (indicate cue type and reason) Hand held assistance to walk few steps to head of bed. Patient becomes dizzy in standing and needing frequent sitting rest breaks to go ~91ft. Patient also needing max cues to follow directions, attempts walking towards doorway vs to bed  Toileting- Clothing  Manipulation and Hygiene Maximal assistance;Sit to/from stand;Sitting/lateral lean  Functional mobility during ADLs Minimal  assistance;+2 for physical assistance;+2 for safety/equipment;Cueing for sequencing;Cueing for safety  General ADL Comments Patient requiring increased assistance with self care and functional transfers due to impaired safety awareness, knowledge of precautions, decreased strength, balance and activity tolerance.  Bed Mobility  Overal bed mobility Needs Assistance  Bed Mobility Sit to Supine  Sit to supine Mod assist;HOB elevated  General bed mobility comments to lift legs onto gurney  Balance  Overall balance assessment Needs assistance;History of Falls  Sitting-balance support Feet unsupported  Sitting balance-Leahy Scale Fair  Standing balance support Single extremity supported;Bilateral upper extremity supported  Standing balance-Leahy Scale Poor  Standing balance comment reliant on at least unilateral UE support  OT - End of Session  Activity Tolerance Patient tolerated treatment well  Patient left in bed;with call bell/phone within reach  Nurse Communication Mobility status  OT Assessment  OT Recommendation/Assessment Patient needs continued OT Services  OT Visit Diagnosis Unsteadiness on feet (R26.81);Other abnormalities of gait and mobility (R26.89);Repeated falls (R29.6);Pain;Other symptoms and signs involving cognitive function  Pain - Right/Left Left  Pain - part of body Shoulder  OT Problem List Decreased strength;Decreased activity tolerance;Impaired balance (sitting and/or standing);Decreased range of motion;Decreased cognition;Decreased safety awareness;Pain;Impaired UE functional use;Decreased knowledge of precautions  OT Plan  OT Frequency (ACUTE ONLY) Min 2X/week  OT Treatment/Interventions (ACUTE ONLY) Self-care/ADL training;Therapeutic exercise;Patient/family education;Balance training;Therapeutic activities  AM-PAC OT "6 Clicks" Daily Activity  Outcome Measure (Version 2)  Help from another person eating meals? 3  Help from another person taking care of personal grooming? 3  Help from another person toileting, which includes using toliet, bedpan, or urinal? 2  Help from another person bathing (including washing, rinsing, drying)? 2  Help from another person to put on and taking off regular upper body clothing? 2  Help from another person to put on and taking off regular lower body clothing? 2  6 Click Score 14  Progressive Mobility  What is the highest level of mobility based on the progressive mobility assessment? Level 4 (Walks with assist in room) - Balance while marching in place and cannot step forward and back - Complete  Mobility Sit up in bed/chair position for meals  OT Recommendation  Follow Up Recommendations Skilled nursing-short term rehab (<3 hours/day)  Assistance recommended at discharge Frequent or constant Supervision/Assistance  Patient can return home with the following A lot of help with walking and/or transfers;A lot of help with bathing/dressing/bathroom  Functional Status Assessent Patient has had a recent decline in their functional status and demonstrates the ability to make significant improvements in function in a reasonable and predictable amount of time.  OT Equipment None recommended by OT  Individuals Consulted  Consulted and Agree with Results and Recommendations Patient  Acute Rehab OT Goals  Patient Stated Goal "it hurts"  OT Goal Formulation With patient  Time For Goal Achievement 02/15/21  Potential to Achieve Goals Good  OT Time Calculation  OT Start Time (ACUTE ONLY) 1219  OT Stop Time (ACUTE ONLY) 1243  OT Time Calculation (min) 24 min  OT General Charges  $OT Visit 1 Visit  OT Evaluation  $OT Eval Low Complexity 1 Low  Written Expression  Dominant Hand Right   Marlyce Huge OT OT pager: [phone]

## 2021-02-01 NOTE — Progress Notes (Signed)
[facility] PROGRESS NOTE    Progress Note  Shawn Sanchez  [id number] DOB: 05-25-1931 DOA: 01/31/2021 PCP: Shon Baton, MD     Brief Narrative:   Shawn Sanchez is an 88 y.o. male past medical history significant for TIA, radiculopathy status post laminectomy hypothyroidism, essential hypertension CAD status post angioplasty anemia alcohol abuse who presents from home after fall when walking to the mailbox and injured his shoulder started having significant arm pain and swelling.  Chest x-ray showed mild vascular congestion and left humerus fracture, left shoulder x-ray showed left humerus fracture comminuted and displaced left knee x-ray showed no acute abnormalities.  CT of the head and C-spine with no acute abnormalities.  Orthopedic surgery was consulted    Assessment/Plan:   Mechanical fall leading to a comminuted right humerus fracture Imaging as below. He denies any loss of consciousness and remained totally asymptomatic before and during the episode. Orthopedic surgery was consulted and awaiting further recommendations.  On review of the x-ray to the admitting physician they did not think the patient was likely to undergo surgical intervention. Continue on OTC Tylenol narcotics for pain. Currently NPO, holding Plavix, Lovenox for DVT prophylaxis. Physical therapy has been consulted. Mild leukocytosis likely reactive. Relates he is still in pain.  Essential hypertension: Currently on no antihypertensive medication.  Blood pressure was elevated on admission likely due to pain.  Hyperlipidemia/CAD/history of TIA: Holding Plavix in case surgical procedure is needed continue statins.  Chronic kidney disease stage IIIb: His creatinine has remained at baseline.  BPH: Continue Flomax.  GERD: Continue PPI.  Hypothyroidism: Continue Synthroid.  Insomnia: Continue melatonin.  Mild dementia: Noted  DVT prophylaxis: lovenox Family  Communication:none Status is: Inpatient  Remains inpatient appropriate because: Acute humeral fracture.    Code Status:     Code Status Orders  (From admission, onward)           Start     Ordered   01/31/21 2357  Full code  Continuous        01/31/21 2359           Code Status History     Date Active Date Inactive Code Status Order ID Comments User Context   09/27/2015 1335 09/28/2015 2252 Full Code [id number]  Eustace Moore, MD Inpatient         IV Access:   Peripheral IV   Procedures and diagnostic studies:   DG Chest 2 View  Result Date: 01/31/2021 CLINICAL DATA:  Post fall with chest and left arm pain. EXAM: CHEST - 2 VIEW COMPARISON:  02/05/2016 FINDINGS: Upper normal heart size. Aortic atherosclerosis and tortuosity. Vascular congestion versus bronchovascular crowding. Left proximal humerus fracture, better assessed on concurrent shoulder exam. No pneumothorax or pleural effusion. No visualized rib fracture. IMPRESSION: 1. Mild vascular congestion versus bronchovascular crowding. 2. Left proximal humerus fracture, better assessed on concurrent shoulder exam. Electronically Signed   By: Keith Rake M.D.   On: 01/31/2021 21:05   CT HEAD WO CONTRAST (5MM)  Result Date: 01/31/2021 CLINICAL DATA:  Trauma. EXAM: CT HEAD WITHOUT CONTRAST CT CERVICAL SPINE WITHOUT CONTRAST TECHNIQUE: Multidetector CT imaging of the head and cervical spine was performed following the standard protocol without intravenous contrast. Multiplanar CT image reconstructions of the cervical spine were also generated. RADIATION DOSE REDUCTION: This exam was performed according to the departmental dose-optimization program which includes automated exposure control, adjustment of the mA and/or kV according to patient size and/or use of iterative reconstruction technique. COMPARISON:  MRI brain 04/20/2020. FINDINGS: CT HEAD FINDINGS Brain: No evidence of acute infarction, hemorrhage,  hydrocephalus, extra-axial collection or mass lesion/mass effect. There is stable mild diffuse atrophy with compensatory dilatation of the ventricular system. There is stable mild periventricular white matter hypodensity, likely chronic small vessel ischemic change. Vascular: Atherosclerotic calcifications are present within the cavernous internal carotid arteries. Skull: Normal. Negative for fracture or focal lesion. Sinuses/Orbits: There is mucosal thickening of the ethmoid air cells, right frontal sinus and bilateral maxillary sinuses. Mastoid air cells are clear. Orbits are grossly within normal limits. Other: None. CT CERVICAL SPINE FINDINGS Alignment: Normal. Skull base and vertebrae: No acute fracture. No primary bone lesion or focal pathologic process. Bones are diffusely osteopenic. Soft tissues and spinal canal: No prevertebral fluid or swelling. No visible canal hematoma. Disc levels: There is intervertebral disc space narrowing and endplate osteophyte formation throughout the cervical spine compatible with degenerative change. This is most significant at C5-C6 and C6-C7. There is no severe central canal or neural foraminal stenosis at any level. There is some mild to moderate central canal stenosis at C6-C7 secondary to posterior disc osteophyte complex. There is calcified pannus formation at the level of C2. Upper chest: Negative. Other: None. IMPRESSION: 1.  No acute intracranial process. 2. No acute fracture or traumatic subluxation of the cervical spine. Electronically Signed   By: Ronney Asters M.D.   On: 01/31/2021 21:17   CT Cervical Spine Wo Contrast  Result Date: 01/31/2021 CLINICAL DATA:  Trauma. EXAM: CT HEAD WITHOUT CONTRAST CT CERVICAL SPINE WITHOUT CONTRAST TECHNIQUE: Multidetector CT imaging of the head and cervical spine was performed following the standard protocol without intravenous contrast. Multiplanar CT image reconstructions of the cervical spine were also generated. RADIATION  DOSE REDUCTION: This exam was performed according to the departmental dose-optimization program which includes automated exposure control, adjustment of the mA and/or kV according to patient size and/or use of iterative reconstruction technique. COMPARISON:  MRI brain 04/20/2020. FINDINGS: CT HEAD FINDINGS Brain: No evidence of acute infarction, hemorrhage, hydrocephalus, extra-axial collection or mass lesion/mass effect. There is stable mild diffuse atrophy with compensatory dilatation of the ventricular system. There is stable mild periventricular white matter hypodensity, likely chronic small vessel ischemic change. Vascular: Atherosclerotic calcifications are present within the cavernous internal carotid arteries. Skull: Normal. Negative for fracture or focal lesion. Sinuses/Orbits: There is mucosal thickening of the ethmoid air cells, right frontal sinus and bilateral maxillary sinuses. Mastoid air cells are clear. Orbits are grossly within normal limits. Other: None. CT CERVICAL SPINE FINDINGS Alignment: Normal. Skull base and vertebrae: No acute fracture. No primary bone lesion or focal pathologic process. Bones are diffusely osteopenic. Soft tissues and spinal canal: No prevertebral fluid or swelling. No visible canal hematoma. Disc levels: There is intervertebral disc space narrowing and endplate osteophyte formation throughout the cervical spine compatible with degenerative change. This is most significant at C5-C6 and C6-C7. There is no severe central canal or neural foraminal stenosis at any level. There is some mild to moderate central canal stenosis at C6-C7 secondary to posterior disc osteophyte complex. There is calcified pannus formation at the level of C2. Upper chest: Negative. Other: None. IMPRESSION: 1.  No acute intracranial process. 2. No acute fracture or traumatic subluxation of the cervical spine. Electronically Signed   By: Ronney Asters M.D.   On: 01/31/2021 21:17   DG Shoulder  Left  Result Date: 01/31/2021 CLINICAL DATA:  Post fall with left arm pain. EXAM: LEFT SHOULDER - 2+ VIEW  COMPARISON:  None. FINDINGS: Comminuted fracture of the proximal humerus. Fracture involves the surgical neck and lateral humeral head. There is displacement of greater than 1 cm. No convincing intra-articular involvement. No humeral head dislocation. Acromioclavicular degenerative change. IMPRESSION: Comminuted and displaced proximal humerus fracture involving the surgical neck and lateral humeral head. Electronically Signed   By: Narda Rutherford M.D.   On: 01/31/2021 21:06   DG Knee Complete 4 Views Left  Result Date: 01/31/2021 CLINICAL DATA:  Post fall with left knee pain. EXAM: LEFT KNEE - COMPLETE 4+ VIEW COMPARISON:  None. FINDINGS: No fracture or dislocation. Small knee joint effusion. Mild tricompartmental osteoarthritis. There is prominent chondrocalcinosis. Advanced vascular calcifications. IMPRESSION: 1. No fracture or dislocation of the left knee. 2. Mild tricompartmental osteoarthritis and chondrocalcinosis. 3. Small knee joint effusion. Electronically Signed   By: Narda Rutherford M.D.   On: 01/31/2021 21:07     Medical Consultants:   None.   Subjective:    ANTE CANSLER relates her pain is not controlled.  Objective:    Vitals:   02/01/21 0600 02/01/21 0630 02/01/21 0700 02/01/21 0730  BP: (!) 148/81 134/66 124/75 (!) 140/93  Pulse: 71 67 70 84  Resp: 19 17 16  (!) 21  Temp:      TempSrc:      SpO2: 92% 97% 97% 96%   SpO2: 96 %  No intake or output data in the 24 hours ending 02/01/21 0837 There were no vitals filed for this visit.  Exam: General exam: In no acute distress, mild laceration on top of his left thigh not bleeding no stitches. Respiratory system: Good air movement and clear to auscultation. Cardiovascular system: S1 & S2 heard, RRR. No JVD. Gastrointestinal system: Abdomen is nondistended, soft and nontender.  Central nervous system: Alert  and oriented. No focal neurological deficits. Extremities: Left upper extremity in a sling. Skin: No rashes, lesions or ulcers Psychiatry: Judgement and insight appear normal. Mood & affect appropriate.    Data Reviewed:    Labs: Basic Metabolic Panel: Recent Labs  Lab 01/31/21 2158 02/01/21 0551  NA 137 137  K 4.5 4.5  CL 106 104  CO2 26 25  GLUCOSE 181* 173*  BUN 28* 31*  CREATININE 1.46* 1.44*  CALCIUM 8.8* 8.8*   GFR CrCl cannot be calculated (Unknown ideal weight.). Liver Function Tests: Recent Labs  Lab 01/31/21 2158 02/01/21 0551  AST 26 30  ALT 18 19  ALKPHOS 96 88  BILITOT 1.2 1.6*  PROT 6.5 6.4*  ALBUMIN 3.9 3.7   No results for input(s): LIPASE, AMYLASE in the last 168 hours. No results for input(s): AMMONIA in the last 168 hours. Coagulation profile Recent Labs  Lab 02/01/21 0551  INR 1.2   COVID-19 Labs  No results for input(s): DDIMER, FERRITIN, LDH, CRP in the last 72 hours.  Lab Results  Component Value Date   SARSCOV2NAA NEGATIVE 02/01/2021   SARSCOV2NAA Not Detected 11/27/2018    CBC: Recent Labs  Lab 01/31/21 2158 02/01/21 0551  WBC 10.6* 11.0*  NEUTROABS 8.6*  --   HGB 13.1 12.8*  HCT 38.8* 37.1*  MCV 103.5* 103.9*  PLT 151 146*   Cardiac Enzymes: Recent Labs  Lab 01/31/21 2158  CKTOTAL 133   BNP (last 3 results) No results for input(s): PROBNP in the last 8760 hours. CBG: No results for input(s): GLUCAP in the last 168 hours. D-Dimer: No results for input(s): DDIMER in the last 72 hours. Hgb A1c: No results for input(s):  HGBA1C in the last 72 hours. Lipid Profile: No results for input(s): CHOL, HDL, LDLCALC, TRIG, CHOLHDL, LDLDIRECT in the last 72 hours. Thyroid function studies: No results for input(s): TSH, T4TOTAL, T3FREE, THYROIDAB in the last 72 hours.  Invalid input(s): FREET3 Anemia work up: No results for input(s): VITAMINB12, FOLATE, FERRITIN, TIBC, IRON, RETICCTPCT in the last 72 hours. Sepsis  Labs: Recent Labs  Lab 01/31/21 2158 02/01/21 0551  WBC 10.6* 11.0*   Microbiology Recent Results (from the past 240 hour(s))  Resp Panel by RT-PCR (Flu A&B, Covid) Nasopharyngeal Swab     Status: None   Collection Time: 02/01/21  1:00 AM   Specimen: Nasopharyngeal Swab; Nasopharyngeal(NP) swabs in vial transport medium  Result Value Ref Range Status   SARS Coronavirus 2 by RT PCR NEGATIVE NEGATIVE Final    Comment: (NOTE) SARS-CoV-2 target nucleic acids are NOT DETECTED.  The SARS-CoV-2 RNA is generally detectable in upper respiratory specimens during the acute phase of infection. The lowest concentration of SARS-CoV-2 viral copies this assay can detect is 138 copies/mL. A negative result does not preclude SARS-Cov-2 infection and should not be used as the sole basis for treatment or other patient management decisions. A negative result may occur with  improper specimen collection/handling, submission of specimen other than nasopharyngeal swab, presence of viral mutation(s) within the areas targeted by this assay, and inadequate number of viral copies(<138 copies/mL). A negative result must be combined with clinical observations, patient history, and epidemiological information. The expected result is Negative.  Fact Sheet for Patients:  EntrepreneurPulse.com.au  Fact Sheet for Healthcare Providers:  IncredibleEmployment.be  This test is no t yet approved or cleared by the [place] FDA and  has been authorized for detection and/or diagnosis of SARS-CoV-2 by FDA under an Emergency Use Authorization (EUA). This EUA will remain  in effect (meaning this test can be used) for the duration of the COVID-19 declaration under Section 564(b)(1) of the Act, 21 U.S.C.section 360bbb-3(b)(1), unless the authorization is terminated  or revoked sooner.       Influenza A by PCR NEGATIVE NEGATIVE Final   Influenza B by PCR NEGATIVE NEGATIVE Final     Comment: (NOTE) The Xpert Xpress SARS-CoV-2/FLU/RSV plus assay is intended as an aid in the diagnosis of influenza from Nasopharyngeal swab specimens and should not be used as a sole basis for treatment. Nasal washings and aspirates are unacceptable for Xpert Xpress SARS-CoV-2/FLU/RSV testing.  Fact Sheet for Patients: EntrepreneurPulse.com.au  Fact Sheet for Healthcare Providers: IncredibleEmployment.be  This test is not yet approved or cleared by the [place] FDA and has been authorized for detection and/or diagnosis of SARS-CoV-2 by FDA under an Emergency Use Authorization (EUA). This EUA will remain in effect (meaning this test can be used) for the duration of the COVID-19 declaration under Section 564(b)(1) of the Act, 21 U.S.C. section 360bbb-3(b)(1), unless the authorization is terminated or revoked.  Performed at [facility], [place] [address], [place], [place] [address]      Medications:    levothyroxine  50 mcg Oral Q0600   pantoprazole  40 mg Oral Daily   simvastatin  20 mg Oral Daily   sodium chloride flush  3 mL Intravenous Q12H   tamsulosin  0.4 mg Oral Daily   Continuous Infusions:    LOS: 1 day   Charlynne Cousins  [facility]  02/01/2021, 8:37 AM

## 2021-02-01 NOTE — ED Notes (Signed)
Gave report to Brien Mates RN

## 2021-02-01 NOTE — Consult Note (Signed)
Reason for Consult:Left shoulder injury Referring Physician: ED  Shawn Sanchez is an 88 y.o. male.  HPI: Shawn Sanchez is an 88 y.o. male past medical history significant for TIA, radiculopathy status post laminectomy, hypothyroidism, essential hypertension, CAD status post angioplasty, anemia, alcohol abuse who presents from home after fall. He fell when walking to the mailbox and injured his shoulder. He immediately started having significant arm pain and swelling.  Left shoulder x-ray showed left humerus fracture comminuted and displaced.   Past Medical History:  Diagnosis Date   Anemia, unspecified    Arthritis    likely rheumatoid   Carpal tunnel syndrome    Coronary atherosclerosis of unspecified type of vessel, native or graft    Dementia (HCC)    Depression    Esophageal reflux    Hemorrhage of rectum and anus    Hernia, inguinal    History of bronchitis    History of hiatal hernia    left inguinal    Hyperchylomicronemia    Hypothyroidism    Neuropathy of both feet    Pneumonia    in the past   Shortness of breath dyspnea    with exertion   TIA (transient ischemic attack)    Unspecified disorder of kidney and ureter    Unspecified essential hypertension     Past Surgical History:  Procedure Laterality Date   angioplasy/stent  2006   carpal tunnel release Bilateral    COLONOSCOPY     CORONARY ANGIOPLASTY     2 stents placed in 2006   INGUINAL HERNIA REPAIR Left 02/06/2016   Procedure: REPAIR LEFT INGUINAL HERNIA  ;  Surgeon: Darnell Level, MD;  Location: [facility];  Service: General;  Laterality: Left;   INSERTION OF MESH Left 02/06/2016   Procedure: INSERTION OF MESH;  Surgeon: Darnell Level, MD;  Location: [facility];  Service: General;  Laterality: Left;   lamenectomy  1965   LUMBAR LAMINECTOMY/DECOMPRESSION MICRODISCECTOMY N/A 09/27/2015   Procedure: Laminectomy and Foraminotomy - Lumbar three-four,  Lumbar four-five;  Surgeon: Tia Alert, MD;  Location: [facility];  Service: Neurosurgery;  Laterality: N/A;   UPPER GASTROINTESTINAL ENDOSCOPY      Family History  Problem Relation Age of Onset   Breast cancer Mother    Lung cancer Mother    Diabetes Sister    Lung cancer Father    Coronary artery disease Father    Hypertension Father    Colon cancer Neg Hx     Social History:  reports that he quit smoking about 21 years ago. His smoking use included pipe and cigars. He has never used smokeless tobacco. He reports current alcohol use. He reports that he does not use drugs.  Allergies:  Allergies  Allergen Reactions   Epinephrine Other (See Comments)    WITH NOVOCAIN = CHEST PAIN   Procaine Other (See Comments)    WITH EPINEPHRINE CHEST PAIN Other reaction(s): Other WITH EPINEPHRINE CHEST PAIN   Aricept [Donepezil]     Other reaction(s): Unknown   Levonordefrin [Nordefrin]     Other reaction(s): Unknown   Mepivacaine     Other reaction(s): Unknown   Other Hives and Other (See Comments)    Isocaine = "Mepivacaine"    Medications: I have reviewed the patient's current medications.  Results for orders placed or performed during the hospital encounter of 01/31/21 (from the past 48 hour(s))  CBC with Differential/Platelet     Status: Abnormal   Collection Time: 01/31/21  9:58 PM  Result Value Ref Range   WBC 10.6 (H) 4.0 - 10.5 K/uL   RBC 3.75 (L) 4.22 - 5.81 MIL/uL   Hemoglobin 13.1 13.0 - 17.0 g/dL   HCT 79.4 (L) 80.1 - 65.5 %   MCV 103.5 (H) 80.0 - 100.0 fL   MCH 34.9 (H) 26.0 - 34.0 pg   MCHC 33.8 30.0 - 36.0 g/dL   RDW 37.4 82.7 - 07.8 %   Platelets 151 150 - 400 K/uL   nRBC 0.0 0.0 - 0.2 %   Neutrophils Relative % 81 %   Neutro Abs 8.6 (H) 1.7 - 7.7 K/uL   Lymphocytes Relative 9 %   Lymphs Abs 1.0 0.7 - 4.0 K/uL   Monocytes Relative 8 %   Monocytes Absolute 0.9 0.1 - 1.0 K/uL   Eosinophils Relative 1 %   Eosinophils Absolute 0.1 0.0 - 0.5 K/uL   Basophils Relative 0 %   Basophils Absolute 0.0 0.0 - 0.1 K/uL    Immature Granulocytes 1 %   Abs Immature Granulocytes 0.05 0.00 - 0.07 K/uL    Comment: Performed at [facility], [address] [place], [place] [address]  Comprehensive metabolic panel     Status: Abnormal   Collection Time: 01/31/21  9:58 PM  Result Value Ref Range   Sodium 137 135 - 145 mmol/L   Potassium 4.5 3.5 - 5.1 mmol/L   Chloride 106 98 - 111 mmol/L   CO2 26 22 - 32 mmol/L   Glucose, Bld 181 (H) 70 - 99 mg/dL    Comment: Glucose reference range applies only to samples taken after fasting for at least 8 hours.   BUN 28 (H) 8 - 23 mg/dL   Creatinine, Ser 9.20 (H) 0.61 - 1.24 mg/dL   Calcium 8.8 (L) 8.9 - 10.3 mg/dL   Total Protein 6.5 6.5 - 8.1 g/dL   Albumin 3.9 3.5 - 5.0 g/dL   AST 26 15 - 41 U/L   ALT 18 0 - 44 U/L   Alkaline Phosphatase 96 38 - 126 U/L   Total Bilirubin 1.2 0.3 - 1.2 mg/dL   GFR, Estimated 46 (L) >60 mL/min    Comment: (NOTE) Calculated using the CKD-EPI Creatinine Equation (2021)    Anion gap 5 5 - 15    Comment: Performed at [facility], [address] [place], [place] [address]  CK     Status: None   Collection Time: 01/31/21  9:58 PM  Result Value Ref Range   Total CK 133 49 - 397 U/L    Comment: Performed at [facility], [address], [place], [place] [address]  Resp Panel by RT-PCR (Flu A&B, Covid) Nasopharyngeal Swab     Status: None   Collection Time: 02/01/21  1:00 AM   Specimen: Nasopharyngeal Swab; Nasopharyngeal(NP) swabs in vial transport medium  Result Value Ref Range   SARS Coronavirus 2 by RT PCR NEGATIVE NEGATIVE    Comment: (NOTE) SARS-CoV-2 target nucleic acids are NOT DETECTED.  The SARS-CoV-2 RNA is generally detectable in upper respiratory specimens during the acute phase of infection. The lowest concentration of SARS-CoV-2 viral copies this assay can detect is 138 copies/mL. A negative result does not preclude SARS-Cov-2 infection and should not be used as  the sole basis for treatment or other patient management decisions. A negative result may occur with  improper specimen collection/handling, submission of specimen other than nasopharyngeal swab, presence of viral mutation(s) within the  areas targeted by this assay, and inadequate number of viral copies(<138 copies/mL). A negative result must be combined with clinical observations, patient history, and epidemiological information. The expected result is Negative.  Fact Sheet for Patients:  BloggerCourse.com  Fact Sheet for Healthcare Providers:  SeriousBroker.it  This test is no t yet approved or cleared by the [place] FDA and  has been authorized for detection and/or diagnosis of SARS-CoV-2 by FDA under an Emergency Use Authorization (EUA). This EUA will remain  in effect (meaning this test can be used) for the duration of the COVID-19 declaration under Section 564(b)(1) of the Act, 21 U.S.C.section 360bbb-3(b)(1), unless the authorization is terminated  or revoked sooner.       Influenza A by PCR NEGATIVE NEGATIVE   Influenza B by PCR NEGATIVE NEGATIVE    Comment: (NOTE) The Xpert Xpress SARS-CoV-2/FLU/RSV plus assay is intended as an aid in the diagnosis of influenza from Nasopharyngeal swab specimens and should not be used as a sole basis for treatment. Nasal washings and aspirates are unacceptable for Xpert Xpress SARS-CoV-2/FLU/RSV testing.  Fact Sheet for Patients: BloggerCourse.com  Fact Sheet for Healthcare Providers: SeriousBroker.it  This test is not yet approved or cleared by the [place] FDA and has been authorized for detection and/or diagnosis of SARS-CoV-2 by FDA under an Emergency Use Authorization (EUA). This EUA will remain in effect (meaning this test can be used) for the duration of the COVID-19 declaration under Section 564(b)(1) of the Act, 21  U.S.C. section 360bbb-3(b)(1), unless the authorization is terminated or revoked.  Performed at [facility], [address], [place], [place] [address]   Comprehensive metabolic panel     Status: Abnormal   Collection Time: 02/01/21  5:51 AM  Result Value Ref Range   Sodium 137 135 - 145 mmol/L   Potassium 4.5 3.5 - 5.1 mmol/L   Chloride 104 98 - 111 mmol/L   CO2 25 22 - 32 mmol/L   Glucose, Bld 173 (H) 70 - 99 mg/dL    Comment: Glucose reference range applies only to samples taken after fasting for at least 8 hours.   BUN 31 (H) 8 - 23 mg/dL   Creatinine, Ser 4.69 (H) 0.61 - 1.24 mg/dL   Calcium 8.8 (L) 8.9 - 10.3 mg/dL   Total Protein 6.4 (L) 6.5 - 8.1 g/dL   Albumin 3.7 3.5 - 5.0 g/dL   AST 30 15 - 41 U/L   ALT 19 0 - 44 U/L   Alkaline Phosphatase 88 38 - 126 U/L   Total Bilirubin 1.6 (H) 0.3 - 1.2 mg/dL   GFR, Estimated 47 (L) >60 mL/min    Comment: (NOTE) Calculated using the CKD-EPI Creatinine Equation (2021)    Anion gap 8 5 - 15    Comment: Performed at [facility], [address], [place], [place] [address]  CBC     Status: Abnormal   Collection Time: 02/01/21  5:51 AM  Result Value Ref Range   WBC 11.0 (H) 4.0 - 10.5 K/uL   RBC 3.57 (L) 4.22 - 5.81 MIL/uL   Hemoglobin 12.8 (L) 13.0 - 17.0 g/dL   HCT 84.1 (L) 32.4 - 40.1 %   MCV 103.9 (H) 80.0 - 100.0 fL   MCH 35.9 (H) 26.0 - 34.0 pg   MCHC 34.5 30.0 - 36.0 g/dL   RDW 02.7 25.3 - 66.4 %   Platelets 146 (L) 150 - 400 K/uL   nRBC 0.0 0.0 - 0.2 %  Comment: Performed at [facility], [address], [place], [place] [address]  Protime-INR     Status: None   Collection Time: 02/01/21  5:51 AM  Result Value Ref Range   Prothrombin Time 14.8 11.4 - 15.2 seconds   INR 1.2 0.8 - 1.2    Comment: (NOTE) INR goal varies based on device and disease states. Performed at [facility], [address] [place], [place] [address]     DG  Chest 2 View  Result Date: 01/31/2021 CLINICAL DATA:  Post fall with chest and left arm pain. EXAM: CHEST - 2 VIEW COMPARISON:  02/05/2016 FINDINGS: Upper normal heart size. Aortic atherosclerosis and tortuosity. Vascular congestion versus bronchovascular crowding. Left proximal humerus fracture, better assessed on concurrent shoulder exam. No pneumothorax or pleural effusion. No visualized rib fracture. IMPRESSION: 1. Mild vascular congestion versus bronchovascular crowding. 2. Left proximal humerus fracture, better assessed on concurrent shoulder exam. Electronically Signed   By: Narda Rutherford M.D.   On: 01/31/2021 21:05   CT HEAD WO CONTRAST ( )  Result Date: 01/31/2021 CLINICAL DATA:  Trauma. EXAM: CT HEAD WITHOUT CONTRAST CT CERVICAL SPINE WITHOUT CONTRAST TECHNIQUE: Multidetector CT imaging of the head and cervical spine was performed following the standard protocol without intravenous contrast. Multiplanar CT image reconstructions of the cervical spine were also generated. RADIATION DOSE REDUCTION: This exam was performed according to the departmental dose-optimization program which includes automated exposure control, adjustment of the mA and/or kV according to patient size and/or use of iterative reconstruction technique. COMPARISON:  MRI brain 04/20/2020. FINDINGS: CT HEAD FINDINGS Brain: No evidence of acute infarction, hemorrhage, hydrocephalus, extra-axial collection or mass lesion/mass effect. There is stable mild diffuse atrophy with compensatory dilatation of the ventricular system. There is stable mild periventricular white matter hypodensity, likely chronic small vessel ischemic change. Vascular: Atherosclerotic calcifications are present within the cavernous internal carotid arteries. Skull: Normal. Negative for fracture or focal lesion. Sinuses/Orbits: There is mucosal thickening of the ethmoid air cells, right frontal sinus and bilateral maxillary sinuses. Mastoid air cells are clear.  Orbits are grossly within normal limits. Other: None. CT CERVICAL SPINE FINDINGS Alignment: Normal. Skull base and vertebrae: No acute fracture. No primary bone lesion or focal pathologic process. Bones are diffusely osteopenic. Soft tissues and spinal canal: No prevertebral fluid or swelling. No visible canal hematoma. Disc levels: There is intervertebral disc space narrowing and endplate osteophyte formation throughout the cervical spine compatible with degenerative change. This is most significant at C5-C6 and C6-C7. There is no severe central canal or neural foraminal stenosis at any level. There is some mild to moderate central canal stenosis at C6-C7 secondary to posterior disc osteophyte complex. There is calcified pannus formation at the level of C2. Upper chest: Negative. Other: None. IMPRESSION: 1.  No acute intracranial process. 2. No acute fracture or traumatic subluxation of the cervical spine. Electronically Signed   By: Darliss Cheney M.D.   On: 01/31/2021 21:17   CT Cervical Spine Wo Contrast  Result Date: 01/31/2021 CLINICAL DATA:  Trauma. EXAM: CT HEAD WITHOUT CONTRAST CT CERVICAL SPINE WITHOUT CONTRAST TECHNIQUE: Multidetector CT imaging of the head and cervical spine was performed following the standard protocol without intravenous contrast. Multiplanar CT image reconstructions of the cervical spine were also generated. RADIATION DOSE REDUCTION: This exam was performed according to the departmental dose-optimization program which includes automated exposure control, adjustment of the mA and/or kV according to patient size and/or use of iterative reconstruction technique. COMPARISON:  MRI brain 04/20/2020. FINDINGS: CT HEAD FINDINGS Brain: No evidence of acute infarction, hemorrhage, hydrocephalus, extra-axial collection or mass lesion/mass effect. There is stable mild diffuse atrophy with compensatory dilatation of the ventricular system. There is stable mild periventricular white matter  hypodensity, likely chronic small vessel ischemic change. Vascular: Atherosclerotic calcifications are present within the cavernous internal carotid arteries. Skull: Normal. Negative for fracture or focal lesion. Sinuses/Orbits: There is mucosal thickening of the ethmoid air cells, right frontal sinus and bilateral maxillary sinuses. Mastoid air cells are clear. Orbits are grossly within normal limits. Other: None. CT CERVICAL SPINE FINDINGS Alignment: Normal. Skull base and vertebrae: No acute fracture. No primary bone lesion or focal pathologic process. Bones are diffusely osteopenic. Soft tissues and spinal canal: No prevertebral fluid or swelling. No visible canal hematoma. Disc levels: There is intervertebral disc space narrowing and endplate osteophyte formation throughout the cervical spine compatible with degenerative change. This is most significant at C5-C6 and C6-C7. There is no severe central canal or neural foraminal stenosis at any level. There is some mild to moderate central canal stenosis at C6-C7 secondary to posterior disc osteophyte complex. There is calcified pannus formation at the level of C2. Upper chest: Negative. Other: None. IMPRESSION: 1.  No acute intracranial process. 2. No acute fracture or traumatic subluxation of the cervical spine. Electronically Signed   By: Darliss CheneyAmy  Guttmann M.D.   On: 01/31/2021 21:17   DG Shoulder Left  Result Date: 01/31/2021 CLINICAL DATA:  Post fall with left arm pain. EXAM: LEFT SHOULDER - 2+ VIEW COMPARISON:  None. FINDINGS: Comminuted fracture of the proximal humerus. Fracture involves the surgical neck and lateral humeral head. There is displacement of greater than 1 cm. No convincing intra-articular involvement. No humeral head dislocation. Acromioclavicular degenerative change. IMPRESSION: Comminuted and displaced proximal humerus fracture involving the surgical neck and lateral humeral head. Electronically Signed   By: Narda RutherfordMelanie  Sanford M.D.   On:  01/31/2021 21:06   DG Knee Complete 4 Views Left  Result Date: 01/31/2021 CLINICAL DATA:  Post fall with left knee pain. EXAM: LEFT KNEE - COMPLETE 4+ VIEW COMPARISON:  None. FINDINGS: No fracture or dislocation. Small knee joint effusion. Mild tricompartmental osteoarthritis. There is prominent chondrocalcinosis. Advanced vascular calcifications. IMPRESSION: 1. No fracture or dislocation of the left knee. 2. Mild tricompartmental osteoarthritis and chondrocalcinosis. 3. Small knee joint effusion. Electronically Signed   By: Narda RutherfordMelanie  Sanford M.D.   On: 01/31/2021 21:07    Review of Systems  Constitutional:  Negative for chills and fever.  HENT:  Positive for hearing loss. Negative for sore throat.   Respiratory:  Negative for shortness of breath.   Cardiovascular:  Negative for chest pain.  Gastrointestinal:  Negative for abdominal pain.  Musculoskeletal:  Positive for arthralgias and myalgias.  Neurological:  Negative for light-headedness and headaches.  Blood pressure 127/84, pulse 80, temperature 97.7 F (36.5 C), temperature source Oral, resp. rate (!) 21, SpO2 97 %. Physical Exam Constitutional:      Appearance: Normal appearance. He is normal weight.  HENT:     Head: Normocephalic and atraumatic.  Eyes:     Extraocular Movements: Extraocular movements intact.  Cardiovascular:     Rate and Rhythm: Normal rate.     Pulses: Normal pulses.  Pulmonary:     Effort: Pulmonary effort is normal.  Musculoskeletal:     Left shoulder: Swelling and tenderness present. Decreased range of motion.       Arms:     Cervical back: Normal  range of motion.  Neurological:     Mental Status: He is alert and oriented to person, place, and time.  Psychiatric:        Behavior: Behavior normal.    Assessment/Plan: Patient has a communited and slightly displaced proximal humerus fracture. This should heal well nonoperatively. Patient will remain in sling at all times except for bathing and  dressing. He can perform gentle ROM of hand, wrist, and elbow. OT ordered to help patient with sling management instructions. Patient NWB on left UE. We will see the patient in the office in 10-14 days for repeat xrays.  Kiera Hussey L. [place], PA-C 02/01/2021, 9:42 AM

## 2021-02-01 NOTE — Evaluation (Signed)
Physical Therapy Evaluation Patient Details Name: Shawn Sanchez MRN: [id number] DOB: [birth year] Today's Date: 02/01/2021  History of Present Illness  Patient is an 88 year old male that fell resulting in a L humerus fracture. PMH includes TIA, radiculopathy status post laminectomy, hypothyroidism, essential hypertension, CAD status post angioplasty, anemia, alcohol abuse   Clinical Impression  Shawn Sanchez is 88 y.o. male admitted with above HPI and diagnosis. Patient is currently limited by functional impairments below (see PT problem list). Patient is unreliable historian and unable to obtain clear history of home assist available and PLOF. Currently pt requires mi-mod assist +2 for safety to complete functional transfers and short gait distance with Rt UE support. Patient will benefit from continued skilled PT interventions to address impairments and progress independence with mobility, recommending SNF rehab to improve independence with mobility and reduce fall risk prior to return home. Acute PT will follow and progress as able.        Recommendations for follow up therapy are one component of a multi-disciplinary discharge planning process, led by the attending physician.  Recommendations may be updated based on patient status, additional functional criteria and insurance authorization.  Follow Up Recommendations Skilled nursing-short term rehab (<3 hours/day)    Assistance Recommended at Discharge Frequent or constant Supervision/Assistance  Patient can return home with the following       Equipment Recommendations None recommended by PT (tba)  Recommendations for Other Services       Functional Status Assessment Patient has had a recent decline in their functional status and demonstrates the ability to make significant improvements in function in a reasonable and predictable amount of time.     Precautions / Restrictions Precautions Precautions: Shoulder Shoulder  Interventions: Shoulder sling/immobilizer;At all times Precaution Comments: sling at all times except for bathing and dressing. He can perform gentle ROM of hand, wrist, and elbow Required Braces or Orthoses: Sling Restrictions Weight Bearing Restrictions: Yes LUE Weight Bearing: Non weight bearing      Mobility  Bed Mobility Overal bed mobility: Needs Assistance Bed Mobility: Sit to Supine       Sit to supine: HOB elevated;Min assist   General bed mobility comments: Min assist to control lowering trunk back to bed and raise LE's onto stretcher    Transfers Overall transfer level: Needs assistance Equipment used: 1 person hand held assist Transfers: Sit to/from Stand Sit to Stand: Min assist;Mod assist;+2 safety/equipment           General transfer comment: cues for safety with rise and min assist/HHA at Rt UE for stability to stand from EOB. Mod assist to steady balance on 2 occasions. pt stood 4x from EOB.    Ambulation/Gait Ambulation/Gait assistance: Mod assist;+2 safety/equipment Gait Distance (Feet): 6 Feet Assistive device: 1 person hand held assist Gait Pattern/deviations: Step-to pattern;Decreased step length - right;Decreased step length - left;Decreased stride length;Shuffle;Trunk flexed Gait velocity: decr     General Gait Details: cues for safe step sequencing and to guide direction of gait. no LOB noted. pt required Mod assist to stabilize balance while stepping around EOB to reposition closer to [facility].  Stairs            Wheelchair Mobility    Modified Rankin (Stroke Patients Only)       Balance Overall balance assessment: Needs assistance;History of Falls Sitting-balance support: Feet unsupported Sitting balance-Leahy Scale: Fair     Standing balance support: Single extremity supported;Bilateral upper extremity supported Standing balance-Leahy Scale: Poor Standing balance  comment: reliant on at least unilateral UE support                              Pertinent Vitals/Pain Pain Assessment: 0-10 Pain Score: 5  Breathing: normal Negative Vocalization: occasional moan/groan, low speech, negative/disapproving quality Facial Expression: sad, frightened, frown Body Language: tense, distressed pacing, fidgeting Consolability: distracted or reassured by voice/touch PAINAD Score: 4 Pain Location: L shoulder/arm Pain Descriptors / Indicators: Guarding;Aching;Discomfort Pain Intervention(s): Monitored during session    Home Living Family/patient expects to be discharged to:: Private residence                   Additional Comments: Patient is unreliable historian, unsure of home set up    Prior Function Prior Level of Function : History of Falls (last six months);Patient poor historian/Family not available               ADLs Comments: Unsure of how much assistance patient needs for self care at baseline although does sound as if he lives alone. reports his daughter passed away and he has 2 cats at home.     Hand Dominance   Dominant Hand: Right    Extremity/Trunk Assessment   Upper Extremity Assessment Upper Extremity Assessment: Defer to OT evaluation LUE: Unable to fully assess due to pain;Unable to fully assess due to immobilization;Shoulder pain at rest    Lower Extremity Assessment Lower Extremity Assessment: Generalized weakness    Cervical / Trunk Assessment Cervical / Trunk Assessment: Normal  Communication   Communication: No difficulties  Cognition Arousal/Alertness: Awake/alert Behavior During Therapy: WFL for tasks assessed/performed Overall Cognitive Status: No family/caregiver present to determine baseline cognitive functioning                                 General Comments: Patient is unable to recall he is in the hospital even after being told. RN informed us he was seeing a cat in his room this morning.        General Comments      Exercises      Assessment/Plan    PT Assessment Patient needs continued PT services  PT Problem List Decreased strength;Decreased range of motion;Decreased activity tolerance;Decreased balance;Decreased mobility;Decreased cognition;Decreased knowledge of use of DME;Decreased safety awareness;Decreased knowledge of precautions;Pain       PT Treatment Interventions DME instruction;Gait training;Stair training;Functional mobility training;Therapeutic activities;Therapeutic exercise;Balance training;Patient/family education    PT Goals (Current goals can be found in the Care Plan section)  Acute Rehab PT Goals Patient Stated Goal: unable to state PT Goal Formulation: Patient unable to participate in goal setting Time For Goal Achievement: 02/15/21 Potential to Achieve Goals: Good    Frequency Min 2X/week     Co-evaluation PT/OT/SLP Co-Evaluation/Treatment: Yes Reason for Co-Treatment: For patient/therapist safety;To address functional/ADL transfers PT goals addressed during session: Mobility/safety with mobility OT goals addressed during session: ADL's and self-care       AM-PAC PT "6 Clicks" Mobility  Outcome Measure Help needed turning from your back to your side while in a flat bed without using bedrails?: A Little Help needed moving from lying on your back to sitting on the side of a flat bed without using bedrails?: A Little Help needed moving to and from a bed to a chair (including a wheelchair)?: A Lot Help needed standing up from a chair using your arms (e.g., wheelchair  or bedside chair)?: A Lot Help needed to walk in hospital room?: A Lot Help needed climbing 3-5 steps with a railing? : A Lot 6 Click Score: 14    End of Session Equipment Utilized During Treatment: Gait belt Activity Tolerance: Patient tolerated treatment well Patient left: in bed;with call bell/phone within reach Nurse Communication: Mobility status PT Visit Diagnosis: Unsteadiness on feet (R26.81);Muscle  weakness (generalized) (M62.81);Difficulty in walking, not elsewhere classified (R26.2);Pain;History of falling (Z91.81);Repeated falls (R29.6) Pain - Right/Left: Left Pain - part of body: Arm    Time: 6283-6629 PT Time Calculation (min) (ACUTE ONLY): 24 min   Charges:   PT Evaluation $PT Eval Moderate Complexity: 1 Mod          Wynn Maudlin, DPT Acute Rehabilitation Services Office [phone] Pager [phone]   Anitra Lauth 02/01/2021, 4:03 PM

## 2021-02-01 NOTE — Discharge Instructions (Signed)
Discharge Instructions    A sling has been provided for you. You are to wear this at all times (except for bathing and dressing), until your first visit with Dr. Ave Filter. Please also wear while sleeping at night. While you bath and dress, let the arm/elbow extend straight down to stretch your elbow. Wiggle your fingers and pump your first while your in the sling to prevent hand swelling. Do not put weight on the left arm No lifting with the left arm.  Apply ice to the left shoulder regularly to help reduce pain and swelling.  Follow up in office in 10-14 days.     Please call [phone] during normal business hours or [phone] after hours for any problems.     Do not put weight on the left arm Wear sling at all times except for when bathing and dressing.  You may gently move the hand and wrist of the left arm.  You may gently move the left elbow when out of the sling.  No lifting with the left arm.  Apply ice to the left shoulder regularly to help reduce pain and swelling.  Follow up in office in 10-14 days.

## 2021-02-02 DIAGNOSIS — S42212A Unspecified displaced fracture of surgical neck of left humerus, initial encounter for closed fracture: Secondary | ICD-10-CM | POA: Diagnosis not present

## 2021-02-02 DIAGNOSIS — I10 Essential (primary) hypertension: Secondary | ICD-10-CM | POA: Diagnosis not present

## 2021-02-02 LAB — RESP PANEL BY RT-PCR (FLU A&B, COVID) ARPGX2
Influenza A by PCR: NEGATIVE
Influenza B by PCR: NEGATIVE
SARS Coronavirus 2 by RT PCR: NEGATIVE

## 2021-02-02 LAB — TSH: TSH: 2.816 u[IU]/mL (ref 0.350–4.500)

## 2021-02-02 MED ORDER — HYDROCODONE-ACETAMINOPHEN 5-325 MG PO TABS: 1.0000 | ORAL_TABLET | Freq: Four times a day (QID) | ORAL | 0 refills | Status: DC | PRN

## 2021-02-02 MED ORDER — POLYETHYLENE GLYCOL 3350 17 G PO PACK
17.0000 g | PACK | Freq: Two times a day (BID) | ORAL | Status: AC
  Administered 2021-02-02 – 2021-02-03 (×4): 17 g via ORAL
  Filled 2021-02-02 (×2): qty 1

## 2021-02-02 MED ORDER — CLOPIDOGREL BISULFATE 75 MG PO TABS
75.0000 mg | ORAL_TABLET | Freq: Every day | ORAL | Status: DC
  Administered 2021-02-02 – 2021-02-06 (×5): 75 mg via ORAL
  Filled 2021-02-02 (×5): qty 1

## 2021-02-02 NOTE — Progress Notes (Signed)
[facility] PROGRESS NOTE    Progress Note  Shawn Sanchez  [id number] DOB: 1931/10/01 DOA: 01/31/2021 PCP: Shon Baton, MD     Brief Narrative:   Shawn Sanchez is an 88 y.o. male past medical history significant for TIA, radiculopathy status post laminectomy hypothyroidism, essential hypertension CAD status post angioplasty anemia alcohol abuse who presents from home after fall when walking to the mailbox and injured his shoulder started having significant arm pain and swelling.  Chest x-ray showed mild vascular congestion and left humerus fracture, left shoulder x-ray showed left humerus fracture comminuted and displaced left knee x-ray showed no acute abnormalities.  CT of the head and C-spine with no acute abnormalities.  Orthopedic surgery was consulted    Assessment/Plan:   Mechanical fall leading to a comminuted right humerus fracture: Orthopedic surgery was consulted recommended nonoperative management, remain in sling at all times except for bathing range of motion as tolerated. Will need to follow-up with them in 10 to 14 days to repeat x-ray as an outpatient. Continue narcotics for pain. Resume his diet and Plavix. Physical therapy evaluated the patient, the patient will need skilled nursing facility. Mild leukocytosis likely reactive, has remained afebrile T-max of 98.6. Pain is improved, started on MiraLAX. Patient is stable to be transferred to skilled nursing facility.  Essential hypertension: Currently on no antihypertensive medication.  Hyperlipidemia/CAD/history of TIA: Resume Plavix continue statins.  Chronic kidney disease stage IIIb: His creatinine has remained at baseline.  BPH: Continue Flomax.  GERD: Continue PPI.  Hypothyroidism: Continue Synthroid.  Insomnia: Continue melatonin.  Mild dementia: Noted  DVT prophylaxis: lovenox Family Communication:none Status is: Inpatient  Remains inpatient appropriate because: Acute  humeral fracture, physical therapy evaluated the patient patient is medically stable to be transferred to skilled nursing facility.    Code Status:     Code Status Orders  (From admission, onward)           Start     Ordered   01/31/21 2357  Full code  Continuous        01/31/21 2359           Code Status History     Date Active Date Inactive Code Status Order ID Comments User Context   09/27/2015 1335 09/28/2015 2252 Full Code [id number]  Eustace Moore, MD Inpatient         IV Access:   Peripheral IV   Procedures and diagnostic studies:   DG Chest 2 View  Result Date: 01/31/2021 CLINICAL DATA:  Post fall with chest and left arm pain. EXAM: CHEST - 2 VIEW COMPARISON:  02/05/2016 FINDINGS: Upper normal heart size. Aortic atherosclerosis and tortuosity. Vascular congestion versus bronchovascular crowding. Left proximal humerus fracture, better assessed on concurrent shoulder exam. No pneumothorax or pleural effusion. No visualized rib fracture. IMPRESSION: 1. Mild vascular congestion versus bronchovascular crowding. 2. Left proximal humerus fracture, better assessed on concurrent shoulder exam. Electronically Signed   By: Keith Rake M.D.   On: 01/31/2021 21:05   CT HEAD WO CONTRAST (5MM)  Result Date: 01/31/2021 CLINICAL DATA:  Trauma. EXAM: CT HEAD WITHOUT CONTRAST CT CERVICAL SPINE WITHOUT CONTRAST TECHNIQUE: Multidetector CT imaging of the head and cervical spine was performed following the standard protocol without intravenous contrast. Multiplanar CT image reconstructions of the cervical spine were also generated. RADIATION DOSE REDUCTION: This exam was performed according to the departmental dose-optimization program which includes automated exposure control, adjustment of the mA and/or kV according to patient size and/or use  of iterative reconstruction technique. COMPARISON:  MRI brain 04/20/2020. FINDINGS: CT HEAD FINDINGS Brain: No evidence of acute infarction,  hemorrhage, hydrocephalus, extra-axial collection or mass lesion/mass effect. There is stable mild diffuse atrophy with compensatory dilatation of the ventricular system. There is stable mild periventricular white matter hypodensity, likely chronic small vessel ischemic change. Vascular: Atherosclerotic calcifications are present within the cavernous internal carotid arteries. Skull: Normal. Negative for fracture or focal lesion. Sinuses/Orbits: There is mucosal thickening of the ethmoid air cells, right frontal sinus and bilateral maxillary sinuses. Mastoid air cells are clear. Orbits are grossly within normal limits. Other: None. CT CERVICAL SPINE FINDINGS Alignment: Normal. Skull base and vertebrae: No acute fracture. No primary bone lesion or focal pathologic process. Bones are diffusely osteopenic. Soft tissues and spinal canal: No prevertebral fluid or swelling. No visible canal hematoma. Disc levels: There is intervertebral disc space narrowing and endplate osteophyte formation throughout the cervical spine compatible with degenerative change. This is most significant at C5-C6 and C6-C7. There is no severe central canal or neural foraminal stenosis at any level. There is some mild to moderate central canal stenosis at C6-C7 secondary to posterior disc osteophyte complex. There is calcified pannus formation at the level of C2. Upper chest: Negative. Other: None. IMPRESSION: 1.  No acute intracranial process. 2. No acute fracture or traumatic subluxation of the cervical spine. Electronically Signed   By: Ronney Asters M.D.   On: 01/31/2021 21:17   CT Cervical Spine Wo Contrast  Result Date: 01/31/2021 CLINICAL DATA:  Trauma. EXAM: CT HEAD WITHOUT CONTRAST CT CERVICAL SPINE WITHOUT CONTRAST TECHNIQUE: Multidetector CT imaging of the head and cervical spine was performed following the standard protocol without intravenous contrast. Multiplanar CT image reconstructions of the cervical spine were also  generated. RADIATION DOSE REDUCTION: This exam was performed according to the departmental dose-optimization program which includes automated exposure control, adjustment of the mA and/or kV according to patient size and/or use of iterative reconstruction technique. COMPARISON:  MRI brain 04/20/2020. FINDINGS: CT HEAD FINDINGS Brain: No evidence of acute infarction, hemorrhage, hydrocephalus, extra-axial collection or mass lesion/mass effect. There is stable mild diffuse atrophy with compensatory dilatation of the ventricular system. There is stable mild periventricular white matter hypodensity, likely chronic small vessel ischemic change. Vascular: Atherosclerotic calcifications are present within the cavernous internal carotid arteries. Skull: Normal. Negative for fracture or focal lesion. Sinuses/Orbits: There is mucosal thickening of the ethmoid air cells, right frontal sinus and bilateral maxillary sinuses. Mastoid air cells are clear. Orbits are grossly within normal limits. Other: None. CT CERVICAL SPINE FINDINGS Alignment: Normal. Skull base and vertebrae: No acute fracture. No primary bone lesion or focal pathologic process. Bones are diffusely osteopenic. Soft tissues and spinal canal: No prevertebral fluid or swelling. No visible canal hematoma. Disc levels: There is intervertebral disc space narrowing and endplate osteophyte formation throughout the cervical spine compatible with degenerative change. This is most significant at C5-C6 and C6-C7. There is no severe central canal or neural foraminal stenosis at any level. There is some mild to moderate central canal stenosis at C6-C7 secondary to posterior disc osteophyte complex. There is calcified pannus formation at the level of C2. Upper chest: Negative. Other: None. IMPRESSION: 1.  No acute intracranial process. 2. No acute fracture or traumatic subluxation of the cervical spine. Electronically Signed   By: Ronney Asters M.D.   On: 01/31/2021 21:17    DG Shoulder Left  Result Date: 01/31/2021 CLINICAL DATA:  Post fall with left arm pain.  EXAM: LEFT SHOULDER - 2+ VIEW COMPARISON:  None. FINDINGS: Comminuted fracture of the proximal humerus. Fracture involves the surgical neck and lateral humeral head. There is displacement of greater than 1 cm. No convincing intra-articular involvement. No humeral head dislocation. Acromioclavicular degenerative change. IMPRESSION: Comminuted and displaced proximal humerus fracture involving the surgical neck and lateral humeral head. Electronically Signed   By: Keith Rake M.D.   On: 01/31/2021 21:06   DG Knee Complete 4 Views Left  Result Date: 01/31/2021 CLINICAL DATA:  Post fall with left knee pain. EXAM: LEFT KNEE - COMPLETE 4+ VIEW COMPARISON:  None. FINDINGS: No fracture or dislocation. Small knee joint effusion. Mild tricompartmental osteoarthritis. There is prominent chondrocalcinosis. Advanced vascular calcifications. IMPRESSION: 1. No fracture or dislocation of the left knee. 2. Mild tricompartmental osteoarthritis and chondrocalcinosis. 3. Small knee joint effusion. Electronically Signed   By: Keith Rake M.D.   On: 01/31/2021 21:07     Medical Consultants:   None.   Subjective:    Shawn Sanchez is not had a bowel movement. relates his pain is better controlled today  Objective:    Vitals:   02/01/21 2033 02/01/21 2251 02/02/21 0500 02/02/21 0559  BP: (!) 143/89   (!) 159/65  Pulse: 73   61  Resp: 18   18  Temp: 98 F (36.7 C)   98.6 F (37 C)  TempSrc: Oral     SpO2: 100%   95%  Weight:  64.9 kg 64.9 kg   Height:  5\' 8"  (1.727 m)     SpO2: 95 %  No intake or output data in the 24 hours ending 02/02/21 1027 Filed Weights   02/01/21 2251 02/02/21 0500  Weight: 64.9 kg 64.9 kg    Exam: General exam: In no acute distress, mild laceration on top of his left thigh not bleeding no general exam: In no acute distress. Respiratory system: Good air movement and clear  to auscultation. Cardiovascular system: S1 & S2 heard, RRR. No JVD. Gastrointestinal system: Abdomen is nondistended, soft and nontender.  Extremities: No pedal edema. Skin: Large bruise around his left shoulder. Psychiatry: Judgement and insight appear normal. Mood & affect appropriate.   Data Reviewed:    Labs: Basic Metabolic Panel: Recent Labs  Lab 01/31/21 2158 02/01/21 0551  NA 137 137  K 4.5 4.5  CL 106 104  CO2 26 25  GLUCOSE 181* 173*  BUN 28* 31*  CREATININE 1.46* 1.44*  CALCIUM 8.8* 8.8*    GFR Estimated Creatinine Clearance: 33.2 mL/min (A) (by C-G formula based on SCr of 1.44 mg/dL (H)). Liver Function Tests: Recent Labs  Lab 01/31/21 2158 02/01/21 0551  AST 26 30  ALT 18 19  ALKPHOS 96 88  BILITOT 1.2 1.6*  PROT 6.5 6.4*  ALBUMIN 3.9 3.7    No results for input(s): LIPASE, AMYLASE in the last 168 hours. No results for input(s): AMMONIA in the last 168 hours. Coagulation profile Recent Labs  Lab 02/01/21 0551  INR 1.2    COVID-19 Labs  No results for input(s): DDIMER, FERRITIN, LDH, CRP in the last 72 hours.  Lab Results  Component Value Date   SARSCOV2NAA NEGATIVE 02/01/2021   Foley Not Detected 11/27/2018    CBC: Recent Labs  Lab 01/31/21 2158 02/01/21 0551  WBC 10.6* 11.0*  NEUTROABS 8.6*  --   HGB 13.1 12.8*  HCT 38.8* 37.1*  MCV 103.5* 103.9*  PLT 151 146*    Cardiac Enzymes: Recent Labs  Lab 01/31/21  2158  CKTOTAL 133    BNP (last 3 results) No results for input(s): PROBNP in the last 8760 hours. CBG: No results for input(s): GLUCAP in the last 168 hours. D-Dimer: No results for input(s): DDIMER in the last 72 hours. Hgb A1c: No results for input(s): HGBA1C in the last 72 hours. Lipid Profile: No results for input(s): CHOL, HDL, LDLCALC, TRIG, CHOLHDL, LDLDIRECT in the last 72 hours. Thyroid function studies: Recent Labs    02/02/21 0400  TSH 2.816   Anemia work up: No results for input(s):  VITAMINB12, FOLATE, FERRITIN, TIBC, IRON, RETICCTPCT in the last 72 hours. Sepsis Labs: Recent Labs  Lab 01/31/21 2158 02/01/21 0551  WBC 10.6* 11.0*    Microbiology Recent Results (from the past 240 hour(s))  Resp Panel by RT-PCR (Flu A&B, Covid) Nasopharyngeal Swab     Status: None   Collection Time: 02/01/21  1:00 AM   Specimen: Nasopharyngeal Swab; Nasopharyngeal(NP) swabs in vial transport medium  Result Value Ref Range Status   SARS Coronavirus 2 by RT PCR NEGATIVE NEGATIVE Final    Comment: (NOTE) SARS-CoV-2 target nucleic acids are NOT DETECTED.  The SARS-CoV-2 RNA is generally detectable in upper respiratory specimens during the acute phase of infection. The lowest concentration of SARS-CoV-2 viral copies this assay can detect is 138 copies/mL. A negative result does not preclude SARS-Cov-2 infection and should not be used as the sole basis for treatment or other patient management decisions. A negative result may occur with  improper specimen collection/handling, submission of specimen other than nasopharyngeal swab, presence of viral mutation(s) within the areas targeted by this assay, and inadequate number of viral copies(<138 copies/mL). A negative result must be combined with clinical observations, patient history, and epidemiological information. The expected result is Negative.  Fact Sheet for Patients:  EntrepreneurPulse.com.au  Fact Sheet for Healthcare Providers:  IncredibleEmployment.be  This test is no t yet approved or cleared by the [place] FDA and  has been authorized for detection and/or diagnosis of SARS-CoV-2 by FDA under an Emergency Use Authorization (EUA). This EUA will remain  in effect (meaning this test can be used) for the duration of the COVID-19 declaration under Section 564(b)(1) of the Act, 21 U.S.C.section 360bbb-3(b)(1), unless the authorization is terminated  or revoked sooner.        Influenza A by PCR NEGATIVE NEGATIVE Final   Influenza B by PCR NEGATIVE NEGATIVE Final    Comment: (NOTE) The Xpert Xpress SARS-CoV-2/FLU/RSV plus assay is intended as an aid in the diagnosis of influenza from Nasopharyngeal swab specimens and should not be used as a sole basis for treatment. Nasal washings and aspirates are unacceptable for Xpert Xpress SARS-CoV-2/FLU/RSV testing.  Fact Sheet for Patients: EntrepreneurPulse.com.au  Fact Sheet for Healthcare Providers: IncredibleEmployment.be  This test is not yet approved or cleared by the [place] FDA and has been authorized for detection and/or diagnosis of SARS-CoV-2 by FDA under an Emergency Use Authorization (EUA). This EUA will remain in effect (meaning this test can be used) for the duration of the COVID-19 declaration under Section 564(b)(1) of the Act, 21 U.S.C. section 360bbb-3(b)(1), unless the authorization is terminated or revoked.  Performed at [facility], [place] [address] [place], [place] [address]      Medications:    levothyroxine  50 mcg Oral Q0600   pantoprazole  40 mg Oral Daily   simvastatin  20 mg Oral Daily   sodium chloride flush  3 mL Intravenous Q12H   tamsulosin  0.4 mg Oral Daily   Continuous Infusions:    LOS: 2 days   Charlynne Cousins  [facility]  02/02/2021, 10:27 AM

## 2021-02-02 NOTE — [place] FL2 (Signed)
[place] MEDICAID FL2 LEVEL OF CARE SCREENING TOOL     IDENTIFICATION  Patient Name: Shawn Sanchez PPIRJJOA Birthdate: February 06, 1931 Sex: male Admission Date (Current Location): 01/31/2021  [facility] and [place] Number:  Producer, television/film/video and Address:  [facility],  [address] [place], [place] [address]      Provider Number: [id number]  Attending Physician Name and Address:  Marinda Elk, MD  Relative Name and Phone Number:  Al Roberson(friend) [phone]    Current Level of Care: Hospital Recommended Level of Care: Skilled Nursing Facility Prior Approval Number:    Date Approved/Denied:   PASRR Number:    Discharge Plan: SNF    Current Diagnoses: Patient Active Problem List   Diagnosis Date Noted   Alcohol abuse 01/31/2021   Asthma 01/31/2021   Postsurgical percutaneous transluminal coronary angioplasty status 01/31/2021   Humerus fracture 01/31/2021   History of TIA (transient ischemic attack)    Pneumonia    Neuropathy of both feet    Hypothyroidism    Hyperchylomicronemia    History of hiatal hernia    History of bronchitis    Hernia, inguinal    Depression    Dementia (HCC)    Arthritis    Left inguinal hernia 02/05/2016   S/P lumbar laminectomy 09/27/2015   Benign prostatic hyperplasia with lower urinary tract symptoms 11/07/2011   Benign paroxysmal positional vertigo 07/18/2010   Anxiety disorder 01/09/2010   Insomnia 01/09/2010   DEPRESSION 04/17/2009   CAD, NATIVE VESSEL 09/21/2008   Disorder of kidney and ureter 04/19/2008   ANEMIA-UNSPECIFIED 11/30/2007   RECTAL BLEEDING 11/30/2007   FLATULENCE 11/30/2007   FECAL OCCULT BLOOD 11/30/2007   HLD (hyperlipidemia) 11/25/2007   CARPAL TUNNEL SYNDROME 11/25/2007   Essential hypertension 11/25/2007   CAD 11/25/2007   GERD 11/25/2007   COUGH, CHRONIC 11/25/2007    Orientation RESPIRATION BLADDER Height & Weight     Self  Normal Incontinent Weight: 64.9 kg Height:  5\' 8"  (172.7  cm)  BEHAVIORAL SYMPTOMS/MOOD NEUROLOGICAL BOWEL NUTRITION STATUS      Incontinent Diet (Regular)  AMBULATORY STATUS COMMUNICATION OF NEEDS Skin   Limited Assist Verbally Normal                       Personal Care Assistance Level of Assistance  Bathing, Feeding, Dressing Bathing Assistance: Limited assistance Feeding assistance: Limited assistance Dressing Assistance: Limited assistance     Functional Limitations Info  Sight, Hearing, Speech Sight Info: Impaired (eyeglasses) Hearing Info: Adequate Speech Info: Adequate    SPECIAL CARE FACTORS FREQUENCY  PT (By licensed PT), OT (By licensed OT)     PT Frequency:  (5x week) OT Frequency:  (5x week)            Contractures Contractures Info: Not present    Additional Factors Info  Code Status, Allergies Code Status Info:  (Full) Allergies Info:  (Epinephrine, Procaine, Aricept (Donepezil), Levonordefrin (Nordefrin), Mepivacaine)           Current Medications (02/02/2021):  This is the current hospital active medication list Current Facility-Administered Medications  Medication Dose Route Frequency Provider Last Rate Last Admin   acetaminophen (TYLENOL) tablet 650 mg  650 mg Oral Q6H PRN 02/04/2021, MD       Or   acetaminophen (TYLENOL) suppository 650 mg  650 mg Rectal Q6H PRN Synetta Fail, MD       clopidogrel (PLAVIX) tablet 75 mg  75 mg Oral Daily  Marinda Elk, MD   75 mg at 02/02/21 1347   HYDROcodone-acetaminophen (NORCO/VICODIN) 5-325 MG per tablet 1 tablet  1 tablet Oral Q6H PRN Synetta Fail, MD   1 tablet at 02/01/21 1527   HYDROmorphone (DILAUDID) injection 0.5 mg  0.5 mg Intravenous Q4H PRN Synetta Fail, MD   0.5 mg at 02/02/21 0124   levothyroxine (SYNTHROID) tablet 50 mcg  50 mcg Oral Q0600 Synetta Fail, MD   50 mcg at 02/02/21 0857   melatonin tablet 10 mg  10 mg Oral QHS PRN Synetta Fail, MD       pantoprazole (PROTONIX) EC tablet 40 mg  40 mg  Oral Daily Synetta Fail, MD   40 mg at 02/02/21 0857   polyethylene glycol (MIRALAX / GLYCOLAX) packet 17 g  17 g Oral Daily PRN Synetta Fail, MD       polyethylene glycol (MIRALAX / GLYCOLAX) packet 17 g  17 g Oral BID Marinda Elk, MD   17 g at 02/02/21 1347   simvastatin (ZOCOR) tablet 20 mg  20 mg Oral Daily Synetta Fail, MD   20 mg at 02/02/21 0857   sodium chloride flush (NS) 0.9 % injection 3 mL  3 mL Intravenous Q12H Synetta Fail, MD   3 mL at 02/02/21 0857   tamsulosin (FLOMAX) capsule 0.4 mg  0.4 mg Oral Daily Synetta Fail, MD   0.4 mg at 02/02/21 0355     Discharge Medications: Please see discharge summary for a list of discharge medications.  Relevant Imaging Results:  Relevant Lab Results:   Additional Information  [phone])  Saba Gomm, Olegario Messier, RN

## 2021-02-02 NOTE — Progress Notes (Signed)
Report given to receiving RN on 5E. All questions were answered. Patient is discharged. Awaiting SNF placement. IV and tele d/c.

## 2021-02-02 NOTE — TOC Initial Note (Signed)
Transition of Care [facility]) - Initial/Assessment Note    Patient Details  Name: Shawn Sanchez MRN: [id number] Date of Birth: 29-Apr-1931  [facility] CM/SW Contact:    Lanier Clam, RN Phone Number: 02/02/2021, 3:21 PM  Clinical Narrative: Patient w/dementia. Primary contact person Melonie(his dtr expired) Contact Al roberson(friend on contact list) agree to SNF.faxed out await bed offers, & pasrr.                 Expected Discharge Plan: Skilled Nursing Facility Barriers to Discharge: Other (must enter comment) (faxed out await bed offers,  pasrr.)   Patient Goals and CMS Choice        Expected Discharge Plan and Services Expected Discharge Plan: Skilled Nursing Facility         Expected Discharge Date: 02/02/21                                    Prior Living Arrangements/Services                       Activities of Daily Living Home Assistive Devices/Equipment: Gilmer Mor (specify quad or straight), Dentures (specify type), Eyeglasses, Grab bars around toilet, Grab bars in shower, Hearing aid (Both canes,) ADL Screening (condition at time of admission) Patient's cognitive ability adequate to safely complete daily activities?: No Is the patient deaf or have difficulty hearing?: Yes Does the patient have difficulty seeing, even when wearing glasses/contacts?: Yes Does the patient have difficulty concentrating, remembering, or making decisions?: Yes Patient able to express need for assistance with ADLs?: Yes Does the patient have difficulty dressing or bathing?: Yes Independently performs ADLs?: No Communication: Independent Dressing (OT): Needs assistance Is this a change from baseline?: Pre-admission baseline Grooming: Independent Feeding: Independent Bathing: Independent Toileting: Independent In/Out Bed: Independent Walks in Home: Independent Does the patient have difficulty walking or climbing stairs?: No Weakness of Legs:  Both Weakness of Arms/Hands: Both  Permission Sought/Granted                  Emotional Assessment              Admission diagnosis:  Humerus fracture [S42.309A] Other closed displaced fracture of proximal end of left humerus, initial encounter [S42.292A] Patient Active Problem List   Diagnosis Date Noted   Alcohol abuse 01/31/2021   Asthma 01/31/2021   Postsurgical percutaneous transluminal coronary angioplasty status 01/31/2021   Humerus fracture 01/31/2021   History of TIA (transient ischemic attack)    Pneumonia    Neuropathy of both feet    Hypothyroidism    Hyperchylomicronemia    History of hiatal hernia    History of bronchitis    Hernia, inguinal    Depression    Dementia (HCC)    Arthritis    Left inguinal hernia 02/05/2016   S/P lumbar laminectomy 09/27/2015   Benign prostatic hyperplasia with lower urinary tract symptoms 11/07/2011   Benign paroxysmal positional vertigo 07/18/2010   Anxiety disorder 01/09/2010   Insomnia 01/09/2010   DEPRESSION 04/17/2009   CAD, NATIVE VESSEL 09/21/2008   Disorder of kidney and ureter 04/19/2008   ANEMIA-UNSPECIFIED 11/30/2007   RECTAL BLEEDING 11/30/2007   FLATULENCE 11/30/2007   FECAL OCCULT BLOOD 11/30/2007   HLD (hyperlipidemia) 11/25/2007   CARPAL TUNNEL SYNDROME 11/25/2007   Essential hypertension 11/25/2007   CAD 11/25/2007   GERD 11/25/2007   COUGH, CHRONIC 11/25/2007   PCP:  [name], MD Pharmacy:   [facility] [id number] - [name], [place] - [address] AT [facility] [address] [place] [address] Phone: [phone] Fax: [phone]     Social Determinants of Health (SDOH) Interventions    Readmission Risk Interventions No flowsheet data found.

## 2021-02-02 NOTE — Discharge Summary (Signed)
Physician Discharge Summary  Shawn Sanchez [id number] DOB: 1932-01-19 DOA: 01/31/2021  PCP: Shon Baton, MD  Admit date: 01/31/2021 Discharge date: 02/02/2021  Admitted From: Home Disposition:  SNF  Recommendations for Outpatient Follow-up:  Follow up with Ortho in 1-2 weeks Please obtain BMP/CBC in one week   Home Health:No Equipment/Devices:SNF  Discharge Condition:Stable CODE STATUS:Full Diet recommendation: Heart Healthy   Brief/Interim Summary: You may copy/paste interim summary or write brief hospital course depending on length of stay  Discharge Diagnoses:  Principal Problem:   Humerus fracture Active Problems:   HLD (hyperlipidemia)   Essential hypertension   CAD, NATIVE VESSEL   GERD   History of TIA (transient ischemic attack)   Hypothyroidism   Benign prostatic hyperplasia with lower urinary tract symptoms  Mechanical fall leading to a comminuted right femoral fracture: Orthopedic surgery was consulted recommended nonoperative management remains in sling except for bathing motion as tolerated. Will need to follow-up within 14 days to repeat x-ray. Physical therapy evaluated the patient, he will be going to skilled nursing facility. Norco was controlling his pain he was started on MiraLAX and has had a bowel movement.  Essential hypertension: No changes made to his medication.  Hyperlipidemia: Resume Plavix and statins.  Chronic kidney stage IIIb: His creatinine is at baseline.  BPH: Continue Flomax.  GERD: Continue PPI.  Hypothyroidism: Continue Synthroid.  Insomnia: Continue melatonin.  Mild dementia: Noted.  Discharge Instructions  Discharge Instructions     Diet - low sodium heart healthy   Complete by: As directed    Increase activity slowly   Complete by: As directed       Allergies as of 02/02/2021       Reactions   Epinephrine Other (See Comments)   WITH NOVOCAIN = CHEST PAIN   Procaine Other (See Comments)   WITH  EPINEPHRINE CHEST PAIN Other reaction(s): Other WITH EPINEPHRINE CHEST PAIN   Aricept [donepezil]    Other reaction(s): Unknown   Levonordefrin [nordefrin]    Other reaction(s): Unknown   Mepivacaine    Other reaction(s): Unknown   Other Hives, Other (See Comments)   Isocaine = "Mepivacaine"        Medication List     TAKE these medications    cholecalciferol 1000 units tablet Commonly known as: VITAMIN D Take 5,000 Units by mouth daily. Reported on 07/01/2015   clopidogrel 75 MG tablet Commonly known as: PLAVIX Take 75 mg by mouth at bedtime. Patient not taking since 02/02/2016   HYDROcodone-acetaminophen 5-325 MG tablet Commonly known as: NORCO/VICODIN Take 1 tablet by mouth every 6 (six) hours as needed for up to 5 days for moderate pain.   levothyroxine 50 MCG tablet Commonly known as: SYNTHROID Take 50 mcg by mouth daily before breakfast.   Melatonin 5 MG Caps Take 10 mg by mouth at bedtime as needed (sleep). Reported on 07/01/2015   omeprazole 20 MG capsule Commonly known as: PRILOSEC Take 20 mg by mouth every other day.   simvastatin 20 MG tablet Commonly known as: ZOCOR TAKE 1 TABLET(20 MG) BY MOUTH DAILY What changed: See the new instructions.   tamsulosin 0.4 MG Caps capsule Commonly known as: FLOMAX Take 0.4 mg by mouth daily.        Follow-up Information     Tania Ade, MD Follow up in 2 week(s).   Specialty: Orthopedic Surgery Contact information: [address] [place] [address] [phone]  Allergies  Allergen Reactions   Epinephrine Other (See Comments)    WITH NOVOCAIN = CHEST PAIN   Procaine Other (See Comments)    WITH EPINEPHRINE CHEST PAIN Other reaction(s): Other WITH EPINEPHRINE CHEST PAIN   Aricept [Donepezil]     Other reaction(s): Unknown   Levonordefrin [Nordefrin]     Other reaction(s): Unknown   Mepivacaine     Other reaction(s): Unknown   Other Hives and Other  (See Comments)    Isocaine = "Mepivacaine"    Consultations: Orthopedic surgery   Procedures/Studies: DG Chest 2 View  Result Date: 01/31/2021 CLINICAL DATA:  Post fall with chest and left arm pain. EXAM: CHEST - 2 VIEW COMPARISON:  02/05/2016 FINDINGS: Upper normal heart size. Aortic atherosclerosis and tortuosity. Vascular congestion versus bronchovascular crowding. Left proximal humerus fracture, better assessed on concurrent shoulder exam. No pneumothorax or pleural effusion. No visualized rib fracture. IMPRESSION: 1. Mild vascular congestion versus bronchovascular crowding. 2. Left proximal humerus fracture, better assessed on concurrent shoulder exam. Electronically Signed   By: Keith Rake M.D.   On: 01/31/2021 21:05   CT HEAD WO CONTRAST (5MM)  Result Date: 01/31/2021 CLINICAL DATA:  Trauma. EXAM: CT HEAD WITHOUT CONTRAST CT CERVICAL SPINE WITHOUT CONTRAST TECHNIQUE: Multidetector CT imaging of the head and cervical spine was performed following the standard protocol without intravenous contrast. Multiplanar CT image reconstructions of the cervical spine were also generated. RADIATION DOSE REDUCTION: This exam was performed according to the departmental dose-optimization program which includes automated exposure control, adjustment of the mA and/or kV according to patient size and/or use of iterative reconstruction technique. COMPARISON:  MRI brain 04/20/2020. FINDINGS: CT HEAD FINDINGS Brain: No evidence of acute infarction, hemorrhage, hydrocephalus, extra-axial collection or mass lesion/mass effect. There is stable mild diffuse atrophy with compensatory dilatation of the ventricular system. There is stable mild periventricular white matter hypodensity, likely chronic small vessel ischemic change. Vascular: Atherosclerotic calcifications are present within the cavernous internal carotid arteries. Skull: Normal. Negative for fracture or focal lesion. Sinuses/Orbits: There is mucosal  thickening of the ethmoid air cells, right frontal sinus and bilateral maxillary sinuses. Mastoid air cells are clear. Orbits are grossly within normal limits. Other: None. CT CERVICAL SPINE FINDINGS Alignment: Normal. Skull base and vertebrae: No acute fracture. No primary bone lesion or focal pathologic process. Bones are diffusely osteopenic. Soft tissues and spinal canal: No prevertebral fluid or swelling. No visible canal hematoma. Disc levels: There is intervertebral disc space narrowing and endplate osteophyte formation throughout the cervical spine compatible with degenerative change. This is most significant at C5-C6 and C6-C7. There is no severe central canal or neural foraminal stenosis at any level. There is some mild to moderate central canal stenosis at C6-C7 secondary to posterior disc osteophyte complex. There is calcified pannus formation at the level of C2. Upper chest: Negative. Other: None. IMPRESSION: 1.  No acute intracranial process. 2. No acute fracture or traumatic subluxation of the cervical spine. Electronically Signed   By: Ronney Asters M.D.   On: 01/31/2021 21:17   CT Cervical Spine Wo Contrast  Result Date: 01/31/2021 CLINICAL DATA:  Trauma. EXAM: CT HEAD WITHOUT CONTRAST CT CERVICAL SPINE WITHOUT CONTRAST TECHNIQUE: Multidetector CT imaging of the head and cervical spine was performed following the standard protocol without intravenous contrast. Multiplanar CT image reconstructions of the cervical spine were also generated. RADIATION DOSE REDUCTION: This exam was performed according to the departmental dose-optimization program which includes automated exposure control, adjustment of the mA and/or kV according  to patient size and/or use of iterative reconstruction technique. COMPARISON:  MRI brain 04/20/2020. FINDINGS: CT HEAD FINDINGS Brain: No evidence of acute infarction, hemorrhage, hydrocephalus, extra-axial collection or mass lesion/mass effect. There is stable mild diffuse  atrophy with compensatory dilatation of the ventricular system. There is stable mild periventricular white matter hypodensity, likely chronic small vessel ischemic change. Vascular: Atherosclerotic calcifications are present within the cavernous internal carotid arteries. Skull: Normal. Negative for fracture or focal lesion. Sinuses/Orbits: There is mucosal thickening of the ethmoid air cells, right frontal sinus and bilateral maxillary sinuses. Mastoid air cells are clear. Orbits are grossly within normal limits. Other: None. CT CERVICAL SPINE FINDINGS Alignment: Normal. Skull base and vertebrae: No acute fracture. No primary bone lesion or focal pathologic process. Bones are diffusely osteopenic. Soft tissues and spinal canal: No prevertebral fluid or swelling. No visible canal hematoma. Disc levels: There is intervertebral disc space narrowing and endplate osteophyte formation throughout the cervical spine compatible with degenerative change. This is most significant at C5-C6 and C6-C7. There is no severe central canal or neural foraminal stenosis at any level. There is some mild to moderate central canal stenosis at C6-C7 secondary to posterior disc osteophyte complex. There is calcified pannus formation at the level of C2. Upper chest: Negative. Other: None. IMPRESSION: 1.  No acute intracranial process. 2. No acute fracture or traumatic subluxation of the cervical spine. Electronically Signed   By: Ronney Asters M.D.   On: 01/31/2021 21:17   DG Shoulder Left  Result Date: 01/31/2021 CLINICAL DATA:  Post fall with left arm pain. EXAM: LEFT SHOULDER - 2+ VIEW COMPARISON:  None. FINDINGS: Comminuted fracture of the proximal humerus. Fracture involves the surgical neck and lateral humeral head. There is displacement of greater than 1 cm. No convincing intra-articular involvement. No humeral head dislocation. Acromioclavicular degenerative change. IMPRESSION: Comminuted and displaced proximal humerus fracture  involving the surgical neck and lateral humeral head. Electronically Signed   By: Keith Rake M.D.   On: 01/31/2021 21:06   DG Knee Complete 4 Views Left  Result Date: 01/31/2021 CLINICAL DATA:  Post fall with left knee pain. EXAM: LEFT KNEE - COMPLETE 4+ VIEW COMPARISON:  None. FINDINGS: No fracture or dislocation. Small knee joint effusion. Mild tricompartmental osteoarthritis. There is prominent chondrocalcinosis. Advanced vascular calcifications. IMPRESSION: 1. No fracture or dislocation of the left knee. 2. Mild tricompartmental osteoarthritis and chondrocalcinosis. 3. Small knee joint effusion. Electronically Signed   By: Keith Rake M.D.   On: 01/31/2021 21:07     Subjective: No complaints  Discharge Exam: Vitals:   02/01/21 2033 02/02/21 0559  BP: (!) 143/89 (!) 159/65  Pulse: 73 61  Resp: 18 18  Temp: 98 F (36.7 C) 98.6 F (37 C)  SpO2: 100% 95%   Vitals:   02/01/21 2033 02/01/21 2251 02/02/21 0500 02/02/21 0559  BP: (!) 143/89   (!) 159/65  Pulse: 73   61  Resp: 18   18  Temp: 98 F (36.7 C)   98.6 F (37 C)  TempSrc: Oral     SpO2: 100%   95%  Weight:  64.9 kg 64.9 kg   Height:  5\' 8"  (1.727 m)      General: Pt is alert, awake, not in acute distress Cardiovascular: RRR, S1/S2 +, no rubs, no gallops Respiratory: CTA bilaterally, no wheezing, no rhonchi Abdominal: Soft, NT, ND, bowel sounds + Extremities: no edema, no cyanosis    The results of significant diagnostics from this hospitalization (including  imaging, microbiology, ancillary and laboratory) are listed below for reference.     Microbiology: Recent Results (from the past 240 hour(s))  Resp Panel by RT-PCR (Flu A&B, Covid) Nasopharyngeal Swab     Status: None   Collection Time: 02/01/21  1:00 AM   Specimen: Nasopharyngeal Swab; Nasopharyngeal(NP) swabs in vial transport medium  Result Value Ref Range Status   SARS Coronavirus 2 by RT PCR NEGATIVE NEGATIVE Final    Comment:  (NOTE) SARS-CoV-2 target nucleic acids are NOT DETECTED.  The SARS-CoV-2 RNA is generally detectable in upper respiratory specimens during the acute phase of infection. The lowest concentration of SARS-CoV-2 viral copies this assay can detect is 138 copies/mL. A negative result does not preclude SARS-Cov-2 infection and should not be used as the sole basis for treatment or other patient management decisions. A negative result may occur with  improper specimen collection/handling, submission of specimen other than nasopharyngeal swab, presence of viral mutation(s) within the areas targeted by this assay, and inadequate number of viral copies(<138 copies/mL). A negative result must be combined with clinical observations, patient history, and epidemiological information. The expected result is Negative.  Fact Sheet for Patients:  EntrepreneurPulse.com.au  Fact Sheet for Healthcare Providers:  IncredibleEmployment.be  This test is no t yet approved or cleared by the [place] FDA and  has been authorized for detection and/or diagnosis of SARS-CoV-2 by FDA under an Emergency Use Authorization (EUA). This EUA will remain  in effect (meaning this test can be used) for the duration of the COVID-19 declaration under Section 564(b)(1) of the Act, 21 U.S.C.section 360bbb-3(b)(1), unless the authorization is terminated  or revoked sooner.       Influenza A by PCR NEGATIVE NEGATIVE Final   Influenza B by PCR NEGATIVE NEGATIVE Final    Comment: (NOTE) The Xpert Xpress SARS-CoV-2/FLU/RSV plus assay is intended as an aid in the diagnosis of influenza from Nasopharyngeal swab specimens and should not be used as a sole basis for treatment. Nasal washings and aspirates are unacceptable for Xpert Xpress SARS-CoV-2/FLU/RSV testing.  Fact Sheet for Patients: EntrepreneurPulse.com.au  Fact Sheet for Healthcare  Providers: IncredibleEmployment.be  This test is not yet approved or cleared by the [place] FDA and has been authorized for detection and/or diagnosis of SARS-CoV-2 by FDA under an Emergency Use Authorization (EUA). This EUA will remain in effect (meaning this test can be used) for the duration of the COVID-19 declaration under Section 564(b)(1) of the Act, 21 U.S.C. section 360bbb-3(b)(1), unless the authorization is terminated or revoked.  Performed at [facility], [place] [address], [place], [place] [address]      Labs: BNP (last 3 results) No results for input(s): BNP in the last 8760 hours. Basic Metabolic Panel: Recent Labs  Lab 01/31/21 2158 02/01/21 0551  NA 137 137  K 4.5 4.5  CL 106 104  CO2 26 25  GLUCOSE 181* 173*  BUN 28* 31*  CREATININE 1.46* 1.44*  CALCIUM 8.8* 8.8*   Liver Function Tests: Recent Labs  Lab 01/31/21 2158 02/01/21 0551  AST 26 30  ALT 18 19  ALKPHOS 96 88  BILITOT 1.2 1.6*  PROT 6.5 6.4*  ALBUMIN 3.9 3.7   No results for input(s): LIPASE, AMYLASE in the last 168 hours. No results for input(s): AMMONIA in the last 168 hours. CBC: Recent Labs  Lab 01/31/21 2158 02/01/21 0551  WBC 10.6* 11.0*  NEUTROABS 8.6*  --   HGB 13.1 12.8*  HCT 38.8* 37.1*  MCV  103.5* 103.9*  PLT 151 146*   Cardiac Enzymes: Recent Labs  Lab 01/31/21 2158  CKTOTAL 133   BNP: Invalid input(s): POCBNP CBG: No results for input(s): GLUCAP in the last 168 hours. D-Dimer No results for input(s): DDIMER in the last 72 hours. Hgb A1c No results for input(s): HGBA1C in the last 72 hours. Lipid Profile No results for input(s): CHOL, HDL, LDLCALC, TRIG, CHOLHDL, LDLDIRECT in the last 72 hours. Thyroid function studies Recent Labs    02/02/21 0400  TSH 2.816   Anemia work up No results for input(s): VITAMINB12, FOLATE, FERRITIN, TIBC, IRON, RETICCTPCT in the last 72 hours. Urinalysis    Component Value  Date/Time   COLORURINE YELLOW 03/20/2020 1734   APPEARANCEUR HAZY (A) 03/20/2020 1734   LABSPEC 1.017 03/20/2020 1734   PHURINE 5.0 03/20/2020 1734   GLUCOSEU NEGATIVE 03/20/2020 1734   HGBUR NEGATIVE 03/20/2020 1734   BILIRUBINUR NEGATIVE 03/20/2020 1734   KETONESUR NEGATIVE 03/20/2020 1734   PROTEINUR NEGATIVE 03/20/2020 1734   UROBILINOGEN 0.2 06/17/2010 1608   NITRITE NEGATIVE 03/20/2020 1734   LEUKOCYTESUR NEGATIVE 03/20/2020 1734   Sepsis Labs Invalid input(s): PROCALCITONIN,  WBC,  LACTICIDVEN Microbiology Recent Results (from the past 240 hour(s))  Resp Panel by RT-PCR (Flu A&B, Covid) Nasopharyngeal Swab     Status: None   Collection Time: 02/01/21  1:00 AM   Specimen: Nasopharyngeal Swab; Nasopharyngeal(NP) swabs in vial transport medium  Result Value Ref Range Status   SARS Coronavirus 2 by RT PCR NEGATIVE NEGATIVE Final    Comment: (NOTE) SARS-CoV-2 target nucleic acids are NOT DETECTED.  The SARS-CoV-2 RNA is generally detectable in upper respiratory specimens during the acute phase of infection. The lowest concentration of SARS-CoV-2 viral copies this assay can detect is 138 copies/mL. A negative result does not preclude SARS-Cov-2 infection and should not be used as the sole basis for treatment or other patient management decisions. A negative result may occur with  improper specimen collection/handling, submission of specimen other than nasopharyngeal swab, presence of viral mutation(s) within the areas targeted by this assay, and inadequate number of viral copies(<138 copies/mL). A negative result must be combined with clinical observations, patient history, and epidemiological information. The expected result is Negative.  Fact Sheet for Patients:  EntrepreneurPulse.com.au  Fact Sheet for Healthcare Providers:  IncredibleEmployment.be  This test is no t yet approved or cleared by the [place] FDA and  has been  authorized for detection and/or diagnosis of SARS-CoV-2 by FDA under an Emergency Use Authorization (EUA). This EUA will remain  in effect (meaning this test can be used) for the duration of the COVID-19 declaration under Section 564(b)(1) of the Act, 21 U.S.C.section 360bbb-3(b)(1), unless the authorization is terminated  or revoked sooner.       Influenza A by PCR NEGATIVE NEGATIVE Final   Influenza B by PCR NEGATIVE NEGATIVE Final    Comment: (NOTE) The Xpert Xpress SARS-CoV-2/FLU/RSV plus assay is intended as an aid in the diagnosis of influenza from Nasopharyngeal swab specimens and should not be used as a sole basis for treatment. Nasal washings and aspirates are unacceptable for Xpert Xpress SARS-CoV-2/FLU/RSV testing.  Fact Sheet for Patients: EntrepreneurPulse.com.au  Fact Sheet for Healthcare Providers: IncredibleEmployment.be  This test is not yet approved or cleared by the [place] FDA and has been authorized for detection and/or diagnosis of SARS-CoV-2 by FDA under an Emergency Use Authorization (EUA). This EUA will remain in effect (meaning this test can be used) for the duration of  the COVID-19 declaration under Section 564(b)(1) of the Act, 21 U.S.C. section 360bbb-3(b)(1), unless the authorization is terminated or revoked.  Performed at [facility] [place] [address], [place], [place] [address]       SIGNED:   Charlynne Cousins, MD  [facility] 02/02/2021, 10:36 AM Pager   If 7PM-7AM, please contact night-coverage [email or web address] Password [email or web address]

## 2021-02-02 NOTE — Care Management (Cosign Needed)
Transition of Care (TOC) -30 day Note       Patient Details   Name: Shawn Sanchez  HUT:[id number]  Date of Birth:[birth year]     Transition of Care [facility]) CM/SW Contact   Name: Lanier Clam [facility]  Phone Number:[phone]  Date:02/02/2021  Time:3p     MUST [id number]     To Whom it May Concern:     Please be advised that the above patient will require a short-term nursing home stay, anticipated 30 days or less rehabilitation and strengthening. The plan is for return home.

## 2021-02-03 DIAGNOSIS — S42212A Unspecified displaced fracture of surgical neck of left humerus, initial encounter for closed fracture: Secondary | ICD-10-CM | POA: Diagnosis not present

## 2021-02-03 NOTE — Progress Notes (Signed)
[facility] PROGRESS NOTE    Progress Note  Shawn Sanchez  [id number] DOB: 1931/06/07 DOA: 01/31/2021 PCP: Shon Baton, MD     Brief Narrative:   Shawn Sanchez is an 88 y.o. male past medical history significant for TIA, radiculopathy status post laminectomy hypothyroidism, essential hypertension CAD status post angioplasty anemia alcohol abuse who presents from home after fall when walking to the mailbox and injured his shoulder started having significant arm pain and swelling.  Chest x-ray showed mild vascular congestion and left humerus fracture, left shoulder x-ray showed left humerus fracture comminuted and displaced left knee x-ray showed no acute abnormalities.  CT of the head and C-spine with no acute abnormalities.  Orthopedic surgery was consulted   Assessment/Plan:   Mechanical fall leading to a comminuted right humerus fracture: T-max of 98.6. Still in pain but he relates narcotics are working. Continue MiraLAX p.o. twice daily. Patient is medically stable to be transferred to skilled nursing facility.  Essential hypertension: Currently on no antihypertensive medication.  Hyperlipidemia/CAD/history of TIA: Resume Plavix continue statins.  Chronic kidney disease stage IIIb: His creatinine has remained at baseline.  BPH: Continue Flomax.  GERD: Continue PPI.  Hypothyroidism: Continue Synthroid.  Insomnia: Continue melatonin.  Mild dementia: Noted  DVT prophylaxis: lovenox Family Communication:none Status is: Inpatient  Remains inpatient appropriate because: Acute humeral fracture, physical therapy evaluated the patient patient is medically stable to be transferred to skilled nursing facility.    Code Status:     Code Status Orders  (From admission, onward)           Start     Ordered   01/31/21 2357  Full code  Continuous        01/31/21 2359           Code Status History     Date Active Date Inactive Code Status  Order ID Comments User Context   09/27/2015 1335 09/28/2015 2252 Full Code [id number]  Eustace Moore, MD Inpatient         IV Access:   Peripheral IV   Procedures and diagnostic studies:   No results found.   Medical Consultants:   None.   Subjective:    Shawn Sanchez has not had a bowel movement.  Objective:    Vitals:   02/02/21 1310 02/02/21 2022 02/03/21 0341 02/03/21 0500  BP: 111/64 120/69 131/87   Pulse: 94 87 (!) 108   Resp: 16 20 20    Temp: 98 F (36.7 C) 98.6 F (37 C) 98.3 F (36.8 C)   TempSrc: Oral Oral Oral   SpO2: 98% 98% 98%   Weight:    100.3 kg  Height:       SpO2: 98 %   Intake/Output Summary (Last 24 hours) at 02/03/2021 0950 Last data filed at 02/03/2021 0800 Gross per 24 hour  Intake 480 ml  Output --  Net 480 ml   Filed Weights   02/01/21 2251 02/02/21 0500 02/03/21 0500  Weight: 64.9 kg 64.9 kg 100.3 kg    Exam: General exam: In no acute distress. Respiratory system: Good air movement and clear to auscultation. Cardiovascular system: S1 & S2 heard, RRR. No JVD. Gastrointestinal system: Abdomen is nondistended, soft and nontender.  Extremities: No pedal edema. Skin: No rashes, lesions or ulcers Psychiatry: Judgement and insight appear normal. Mood & affect appropriate.   Data Reviewed:    Labs: Basic Metabolic Panel: Recent Labs  Lab 01/31/21 2158 02/01/21 0551  NA 137 137  K 4.5 4.5  CL 106 104  CO2 26 25  GLUCOSE 181* 173*  BUN 28* 31*  CREATININE 1.46* 1.44*  CALCIUM 8.8* 8.8*    GFR Estimated Creatinine Clearance: 41.5 mL/min (A) (by C-G formula based on SCr of 1.44 mg/dL (H)). Liver Function Tests: Recent Labs  Lab 01/31/21 2158 02/01/21 0551  AST 26 30  ALT 18 19  ALKPHOS 96 88  BILITOT 1.2 1.6*  PROT 6.5 6.4*  ALBUMIN 3.9 3.7    No results for input(s): LIPASE, AMYLASE in the last 168 hours. No results for input(s): AMMONIA in the last 168 hours. Coagulation profile Recent Labs  Lab  02/01/21 0551  INR 1.2    COVID-19 Labs  No results for input(s): DDIMER, FERRITIN, LDH, CRP in the last 72 hours.  Lab Results  Component Value Date   SARSCOV2NAA NEGATIVE 02/02/2021   [place] NEGATIVE 02/01/2021   [place] Not Detected 11/27/2018    CBC: Recent Labs  Lab 01/31/21 2158 02/01/21 0551  WBC 10.6* 11.0*  NEUTROABS 8.6*  --   HGB 13.1 12.8*  HCT 38.8* 37.1*  MCV 103.5* 103.9*  PLT 151 146*    Cardiac Enzymes: Recent Labs  Lab 01/31/21 2158  CKTOTAL 133    BNP (last 3 results) No results for input(s): PROBNP in the last 8760 hours. CBG: No results for input(s): GLUCAP in the last 168 hours. D-Dimer: No results for input(s): DDIMER in the last 72 hours. Hgb A1c: No results for input(s): HGBA1C in the last 72 hours. Lipid Profile: No results for input(s): CHOL, HDL, LDLCALC, TRIG, CHOLHDL, LDLDIRECT in the last 72 hours. Thyroid function studies: Recent Labs    02/02/21 0400  TSH 2.816    Anemia work up: No results for input(s): VITAMINB12, FOLATE, FERRITIN, TIBC, IRON, RETICCTPCT in the last 72 hours. Sepsis Labs: Recent Labs  Lab 01/31/21 2158 02/01/21 0551  WBC 10.6* 11.0*    Microbiology Recent Results (from the past 240 hour(s))  Resp Panel by RT-PCR (Flu A&B, Covid) Nasopharyngeal Swab     Status: None   Collection Time: 02/01/21  1:00 AM   Specimen: Nasopharyngeal Swab; Nasopharyngeal(NP) swabs in vial transport medium  Result Value Ref Range Status   SARS Coronavirus 2 by RT PCR NEGATIVE NEGATIVE Final    Comment: (NOTE) SARS-CoV-2 target nucleic acids are NOT DETECTED.  The SARS-CoV-2 RNA is generally detectable in upper respiratory specimens during the acute phase of infection. The lowest concentration of SARS-CoV-2 viral copies this assay can detect is 138 copies/mL. A negative result does not preclude SARS-Cov-2 infection and should not be used as the sole basis for treatment or other patient management  decisions. A negative result may occur with  improper specimen collection/handling, submission of specimen other than nasopharyngeal swab, presence of viral mutation(s) within the areas targeted by this assay, and inadequate number of viral copies(<138 copies/mL). A negative result must be combined with clinical observations, patient history, and epidemiological information. The expected result is Negative.  Fact Sheet for Patients:  EntrepreneurPulse.com.au  Fact Sheet for Healthcare Providers:  IncredibleEmployment.be  This test is no t yet approved or cleared by the [place] FDA and  has been authorized for detection and/or diagnosis of SARS-CoV-2 by FDA under an Emergency Use Authorization (EUA). This EUA will remain  in effect (meaning this test can be used) for the duration of the COVID-19 declaration under Section 564(b)(1) of the Act, 21 U.S.C.section 360bbb-3(b)(1), unless the authorization is terminated  or revoked sooner.  Influenza A by PCR NEGATIVE NEGATIVE Final   Influenza B by PCR NEGATIVE NEGATIVE Final    Comment: (NOTE) The Xpert Xpress SARS-CoV-2/FLU/RSV plus assay is intended as an aid in the diagnosis of influenza from Nasopharyngeal swab specimens and should not be used as a sole basis for treatment. Nasal washings and aspirates are unacceptable for Xpert Xpress SARS-CoV-2/FLU/RSV testing.  Fact Sheet for Patients: EntrepreneurPulse.com.au  Fact Sheet for Healthcare Providers: IncredibleEmployment.be  This test is not yet approved or cleared by the [place] FDA and has been authorized for detection and/or diagnosis of SARS-CoV-2 by FDA under an Emergency Use Authorization (EUA). This EUA will remain in effect (meaning this test can be used) for the duration of the COVID-19 declaration under Section 564(b)(1) of the Act, 21 U.S.C. section 360bbb-3(b)(1), unless the  authorization is terminated or revoked.  Performed at [facility], [place] [address] [place], [place] [address]   Resp Panel by RT-PCR (Flu A&B, Covid) Nasopharyngeal Swab     Status: None   Collection Time: 02/02/21 12:24 PM   Specimen: Nasopharyngeal Swab; Nasopharyngeal(NP) swabs in vial transport medium  Result Value Ref Range Status   SARS Coronavirus 2 by RT PCR NEGATIVE NEGATIVE Final    Comment: (NOTE) SARS-CoV-2 target nucleic acids are NOT DETECTED.  The SARS-CoV-2 RNA is generally detectable in upper respiratory specimens during the acute phase of infection. The lowest concentration of SARS-CoV-2 viral copies this assay can detect is 138 copies/mL. A negative result does not preclude SARS-Cov-2 infection and should not be used as the sole basis for treatment or other patient management decisions. A negative result may occur with  improper specimen collection/handling, submission of specimen other than nasopharyngeal swab, presence of viral mutation(s) within the areas targeted by this assay, and inadequate number of viral copies(<138 copies/mL). A negative result must be combined with clinical observations, patient history, and epidemiological information. The expected result is Negative.  Fact Sheet for Patients:  EntrepreneurPulse.com.au  Fact Sheet for Healthcare Providers:  IncredibleEmployment.be  This test is no t yet approved or cleared by the [place] FDA and  has been authorized for detection and/or diagnosis of SARS-CoV-2 by FDA under an Emergency Use Authorization (EUA). This EUA will remain  in effect (meaning this test can be used) for the duration of the COVID-19 declaration under Section 564(b)(1) of the Act, 21 U.S.C.section 360bbb-3(b)(1), unless the authorization is terminated  or revoked sooner.       Influenza A by PCR NEGATIVE NEGATIVE Final   Influenza B by PCR NEGATIVE NEGATIVE  Final    Comment: (NOTE) The Xpert Xpress SARS-CoV-2/FLU/RSV plus assay is intended as an aid in the diagnosis of influenza from Nasopharyngeal swab specimens and should not be used as a sole basis for treatment. Nasal washings and aspirates are unacceptable for Xpert Xpress SARS-CoV-2/FLU/RSV testing.  Fact Sheet for Patients: EntrepreneurPulse.com.au  Fact Sheet for Healthcare Providers: IncredibleEmployment.be  This test is not yet approved or cleared by the [place] FDA and has been authorized for detection and/or diagnosis of SARS-CoV-2 by FDA under an Emergency Use Authorization (EUA). This EUA will remain in effect (meaning this test can be used) for the duration of the COVID-19 declaration under Section 564(b)(1) of the Act, 21 U.S.C. section 360bbb-3(b)(1), unless the authorization is terminated or revoked.  Performed at [facility], [place] [address], [place], [place] [address]      Medications:    clopidogrel  75 mg Oral Daily  levothyroxine  50 mcg Oral Q0600   pantoprazole  40 mg Oral Daily   polyethylene glycol  17 g Oral BID   simvastatin  20 mg Oral Daily   sodium chloride flush  3 mL Intravenous Q12H   tamsulosin  0.4 mg Oral Daily   Continuous Infusions:    LOS: 3 days   Charlynne Cousins  [facility]  02/03/2021, 9:50 AM

## 2021-02-03 NOTE — Progress Notes (Signed)
Pt transferred from 5W to 5E room 1504. From report given, this RN was told that pt's SNF placement was cancelled and pt continues to be inpatient at this time until new placement is found. Hospitalist made aware.

## 2021-02-03 NOTE — TOC Progression Note (Addendum)
Transition of Care [facility]) - Progression Note    Patient Details  Name: [name] MRN: [id number] Date of Birth: February 01, 1931  [facility]) CM/SW Contact  Darleene Cleaver, [place] Phone Number: 02/03/2021, 2:02 PM  Clinical Narrative:   Patient has been faxed out awaiting bed offers.  3:15pm  CSW spoke to patient's friend Amaryllis Dyke, [phone] and provided choices for facility.  He chose [facility]  CSW spoke to [place], she will see if patient is able to be admitted tomorrow.  CSW awaiting for call back.   Expected Discharge Plan: Skilled Nursing Facility Barriers to Discharge: Other (must enter comment) (faxed out await bed offers,  pasrr.)  Expected Discharge Plan and Services Expected Discharge Plan: Skilled Nursing Facility         Expected Discharge Date: 02/02/21                                     Social Determinants of Health (SDOH) Interventions    Readmission Risk Interventions No flowsheet data found.

## 2021-02-03 NOTE — Plan of Care (Signed)
°  Problem: Clinical Measurements: Goal: Ability to maintain clinical measurements within normal limits will improve Outcome: Progressing Goal: Will remain free from infection Outcome: Progressing Goal: Diagnostic test results will improve Outcome: Progressing Goal: Respiratory complications will improve Outcome: Progressing   Problem: Nutrition: Goal: Adequate nutrition will be maintained Outcome: Progressing   Problem: Elimination: Goal: Will not experience complications related to bowel motility Outcome: Progressing Goal: Will not experience complications related to urinary retention Outcome: Progressing   Problem: Pain Managment: Goal: General experience of comfort will improve Outcome: Progressing

## 2021-02-04 DIAGNOSIS — I10 Essential (primary) hypertension: Secondary | ICD-10-CM | POA: Diagnosis not present

## 2021-02-04 DIAGNOSIS — S42212A Unspecified displaced fracture of surgical neck of left humerus, initial encounter for closed fracture: Secondary | ICD-10-CM | POA: Diagnosis not present

## 2021-02-04 MED ORDER — HYDROCODONE-ACETAMINOPHEN 5-325 MG PO TABS: 1.0000 | ORAL_TABLET | Freq: Four times a day (QID) | ORAL | 0 refills | Status: AC | PRN

## 2021-02-04 MED ORDER — POLYETHYLENE GLYCOL 3350 17 G PO PACK
17.0000 g | PACK | Freq: Two times a day (BID) | ORAL | Status: AC
  Administered 2021-02-04 – 2021-02-05 (×4): 17 g via ORAL
  Filled 2021-02-04 (×4): qty 1

## 2021-02-04 MED ORDER — SORBITOL 70 % SOLN
30.0000 mL | Freq: Once | Status: AC
  Administered 2021-02-04: 30 mL via ORAL
  Filled 2021-02-04 (×2): qty 30

## 2021-02-04 NOTE — TOC PASRR Note (Cosign Needed)
30 Day Passar Note  RE: Shawn Sanchez Date of Birth: [birth year]__   Date:02/04/2021     To Whom It May Concern:  Please be advised that the above-named patient will require a short-term nursing home stay - anticipated 30 days or less for rehabilitation and strengthening.  The plan is for return home.   Windell Moulding, MSW, [place] [phone]

## 2021-02-04 NOTE — TOC Progression Note (Signed)
Transition of Care [facility]) - Progression Note    Patient Details  Name: Shawn Sanchez MRN: [id number] Date of Birth: [birth year]  Transition of Care [facility]) CM/SW Contact  Ross Ludwig, [place] Phone Number: 02/04/2021, 1:28 PM  Clinical Narrative:     Patient's Passar number is not back yet.  CSW updated attending physician, bedside nurse, [facility], and patient's friend [name].  Avoidable days have been documented, per physician patient is medically ready for discharge once Passar number is back.  Per [facility] patient can be discharged to the facility once Passar number is back if patient is still medically ready discharge.   Expected Discharge Plan: Skilled Nursing Facility Barriers to Discharge: Other (must enter comment) (faxed out await bed offers,  pasrr.)  Expected Discharge Plan and Services Expected Discharge Plan: [name]         Expected Discharge Date: 02/04/21                                     Social Determinants of Health (SDOH) Interventions    Readmission Risk Interventions No flowsheet data found.

## 2021-02-04 NOTE — Progress Notes (Signed)
Occupational Therapy Treatment Patient Details Name: Shawn Sanchez MRN: [id number] DOB: 16-Feb-1931 Today's Date: 02/04/2021   History of present illness Patient is an 88 year old male that fell resulting in a L humerus fracture. PMH includes TIA, radiculopathy status post laminectomy, hypothyroidism, essential hypertension, CAD status post angioplasty, anemia, alcohol abuse   OT comments  Treatment focused on getting patient out of bed and beginning education on how to perform UB dressing. Patient is grumpy and not necessarily agreeable to much activity. He required mod assist to transfer to side of bed and min assist for standing and taking steps to recliner. He reports dizziness with sitting and standing but no significant symptoms. Patient mod assist to don hospital gown. Therapist recommends a larger sling as his full hand is hanging out with constant pressure on the underside of his wrist. Therapist placed ice on his shoulder and educated patient that ice helps control pain and edema. Will need reiteration of today's education and further education. Recommend short term rehab.   Recommendations for follow up therapy are one component of a multi-disciplinary discharge planning process, led by the attending physician.  Recommendations may be updated based on patient status, additional functional criteria and insurance authorization.    Follow Up Recommendations  Skilled nursing-short term rehab (<3 hours/day)    Assistance Recommended at Discharge Frequent or constant Supervision/Assistance  Patient can return home with the following  A little help with walking and/or transfers;A lot of help with bathing/dressing/bathroom;Assistance with cooking/housework   Equipment Recommendations  None recommended by OT    Recommendations for Other Services      Precautions / Restrictions Precautions Precautions: Shoulder Shoulder Interventions: Shoulder sling/immobilizer;At all times Precaution  Comments: sling at all times except for bathing and dressing. He can perform gentle ROM of hand, wrist, and elbow Required Braces or Orthoses: Sling Restrictions Weight Bearing Restrictions: Yes LUE Weight Bearing: Non weight bearing       Mobility Bed Mobility                    Transfers                         Balance Overall balance assessment: Needs assistance Sitting-balance support: No upper extremity supported;Feet supported Sitting balance-Leahy Scale: Fair     Standing balance support: During functional activity   Standing balance comment: reliant on external support for balance                           ADL either performed or assessed with clinical judgement   ADL Overall ADL's : Needs assistance/impaired                 Upper Body Dressing : Moderate assistance;Sitting Upper Body Dressing Details (indicate cue type and reason): Initiated education in regards to compensatory strategies and how to dress arm with use of hospital gown.                   General ADL Comments: Patient mod assist to transfer out of the bed to his right. Min assist to stand and take steps to recliner. reports he is dizzy. Mobility limited to recliner.    Extremity/Trunk Assessment              Vision Baseline Vision/History: 1 Wears glasses     Perception     Praxis      Cognition Arousal/Alertness:  Awake/alert Behavior During Therapy: WFL for tasks assessed/performed Overall Cognitive Status: Within Functional Limits for tasks assessed                                 General Comments: Per Rn patietn alert and oriented. He became irritated with therapist with orientation questions.          Exercises     Shoulder Instructions       General Comments      Pertinent Vitals/ Pain       Pain Assessment: Faces Faces Pain Scale: Hurts a little bit Pain Location: L shoulder/arm Pain Descriptors / Indicators:  Aching;Sore (very stoic) Pain Intervention(s): Limited activity within patient's tolerance;Premedicated before session  Home Living                                          Prior Functioning/Environment              Frequency  Min 2X/week        Progress Toward Goals  OT Goals(current goals can now be found in the care plan section)  Progress towards OT goals: Progressing toward goals  Acute Rehab OT Goals Patient Stated Goal: didn't state OT Goal Formulation: With patient Time For Goal Achievement: 02/15/21 Potential to Achieve Goals: Good  Plan Discharge plan remains appropriate    Co-evaluation          OT goals addressed during session: ADL's and self-care (functional mobility)      AM-PAC OT "6 Clicks" Daily Activity     Outcome Measure   Help from another person eating meals?: A Little Help from another person taking care of personal grooming?: A Little Help from another person toileting, which includes using toliet, bedpan, or urinal?: A Lot Help from another person bathing (including washing, rinsing, drying)?: A Lot Help from another person to put on and taking off regular upper body clothing?: A Lot Help from another person to put on and taking off regular lower body clothing?: A Lot 6 Click Score: 14    End of Session    OT Visit Diagnosis: Unsteadiness on feet (R26.81);Other abnormalities of gait and mobility (R26.89);Repeated falls (R29.6);Pain;Other symptoms and signs involving cognitive function Pain - Right/Left: Left Pain - part of body: Shoulder   Activity Tolerance Patient tolerated treatment well   Patient Left with chair alarm set;in chair   Nurse Communication Mobility status (needs larger sling with immobilizer)        Time: 3810-1751 OT Time Calculation (min): 18 min  Charges: OT General Charges $OT Visit: 1 Visit OT Treatments $Self Care/Home Management : 8-22 mins  Marvie Brevik, OTR/L Acute Care Rehab  Services  Office [phone] Pager: [phone]   Kelli Churn 02/04/2021, 3:31 PM

## 2021-02-04 NOTE — Discharge Summary (Signed)
Physician Discharge Summary  Shawn Sanchez YYQ:[id number] DOB: [birth year] DOA: 01/31/2021  PCP: [name], MD  Admit date: 01/31/2021 Discharge date: 02/04/2021  Admitted From: Home Disposition:  SNF  Recommendations for Outpatient Follow-up:  Follow up with Ortho in 1-2 weeks Please obtain BMP/CBC in one week   Home Health:No Equipment/Devices:SNF  Discharge Condition:Stable CODE STATUS:Full Diet recommendation: Heart Healthy   Brief/Interim Summary: 88 y.o. male past medical history significant for TIA, radiculopathy status post laminectomy hypothyroidism, essential hypertension CAD status post angioplasty anemia alcohol abuse who presents from home after fall when walking to the mailbox and injured his shoulder started having significant arm pain and swelling.  Chest x-ray showed mild vascular congestion and left humerus fracture, left shoulder x-ray showed left humerus fracture comminuted and displaced left knee x-ray showed no acute abnormalities.  CT of the head and C-spine with no acute abnormalities.  Orthopedic surgery was consulted  Discharge Diagnoses:  Principal Problem:   Humerus fracture Active Problems:   HLD (hyperlipidemia)   Essential hypertension   CAD, NATIVE VESSEL   GERD   History of TIA (transient ischemic attack)   Hypothyroidism   Benign prostatic hyperplasia with lower urinary tract symptoms  Mechanical fall leading to a comminuted right femoral fracture: Orthopedic surgery was consulted recommended nonoperative management remains in sling except for bathing motion as tolerated. Will need to follow-up within 14 days to repeat x-ray. Physical therapy evaluated the patient, he will be going to skilled nursing facility. Norco was controlling his pain he was started on MiraLAX and has had a bowel movement.  Essential hypertension: No changes made to his medication.  Hyperlipidemia: Resume Plavix and statins.  Chronic kidney stage IIIb: His  creatinine is at baseline.  BPH: Continue Flomax.  GERD: Continue PPI.  Hypothyroidism: Continue Synthroid.  Insomnia: Continue melatonin.  Mild dementia: Noted.  Discharge Instructions  Discharge Instructions     Diet - low sodium heart healthy   Complete by: As directed    Diet - low sodium heart healthy   Complete by: As directed    Increase activity slowly   Complete by: As directed    Increase activity slowly   Complete by: As directed       Allergies as of 02/04/2021       Reactions   Epinephrine Other (See Comments)   WITH NOVOCAIN = CHEST PAIN   Procaine Other (See Comments)   WITH EPINEPHRINE CHEST PAIN Other reaction(s): Other WITH EPINEPHRINE CHEST PAIN   Aricept [donepezil]    Other reaction(s): Unknown   Levonordefrin [nordefrin]    Other reaction(s): Unknown   Mepivacaine    Other reaction(s): Unknown   Other Hives, Other (See Comments)   Isocaine = "Mepivacaine"        Medication List     TAKE these medications    cholecalciferol 1000 units tablet Commonly known as: VITAMIN D Take 5,000 Units by mouth daily. Reported on 07/01/2015   clopidogrel 75 MG tablet Commonly known as: PLAVIX Take 75 mg by mouth at bedtime. Patient not taking since 02/02/2016   HYDROcodone-acetaminophen 5-325 MG tablet Commonly known as: NORCO/VICODIN Take 1 tablet by mouth every 6 (six) hours as needed for up to 5 days for moderate pain.   levothyroxine 50 MCG tablet Commonly known as: SYNTHROID Take 50 mcg by mouth daily before breakfast.   Melatonin 5 MG Caps Take 10 mg by mouth at bedtime as needed (sleep). Reported on 07/01/2015   omeprazole 20 MG capsule Commonly  known as: PRILOSEC Take 20 mg by mouth every other day.   simvastatin 20 MG tablet Commonly known as: ZOCOR TAKE 1 TABLET(20 MG) BY MOUTH DAILY What changed: See the new instructions.   tamsulosin 0.4 MG Caps capsule Commonly known as: FLOMAX Take 0.4 mg by mouth daily.         Follow-up Information     Jones Broom, MD Follow up in 2 week(s).   Specialty: Orthopedic Surgery Contact information: [address] [place] [address] [phone]                Allergies  Allergen Reactions   Epinephrine Other (See Comments)    WITH NOVOCAIN = CHEST PAIN   Procaine Other (See Comments)    WITH EPINEPHRINE CHEST PAIN Other reaction(s): Other WITH EPINEPHRINE CHEST PAIN   Aricept [Donepezil]     Other reaction(s): Unknown   Levonordefrin [Nordefrin]     Other reaction(s): Unknown   Mepivacaine     Other reaction(s): Unknown   Other Hives and Other (See Comments)    Isocaine = "Mepivacaine"    Consultations: Orthopedic surgery   Procedures/Studies: DG Chest 2 View  Result Date: 01/31/2021 CLINICAL DATA:  Post fall with chest and left arm pain. EXAM: CHEST - 2 VIEW COMPARISON:  02/05/2016 FINDINGS: Upper normal heart size. Aortic atherosclerosis and tortuosity. Vascular congestion versus bronchovascular crowding. Left proximal humerus fracture, better assessed on concurrent shoulder exam. No pneumothorax or pleural effusion. No visualized rib fracture. IMPRESSION: 1. Mild vascular congestion versus bronchovascular crowding. 2. Left proximal humerus fracture, better assessed on concurrent shoulder exam. Electronically Signed   By: Narda Rutherford M.D.   On: 01/31/2021 21:05   CT HEAD WO CONTRAST ( )  Result Date: 01/31/2021 CLINICAL DATA:  Trauma. EXAM: CT HEAD WITHOUT CONTRAST CT CERVICAL SPINE WITHOUT CONTRAST TECHNIQUE: Multidetector CT imaging of the head and cervical spine was performed following the standard protocol without intravenous contrast. Multiplanar CT image reconstructions of the cervical spine were also generated. RADIATION DOSE REDUCTION: This exam was performed according to the departmental dose-optimization program which includes automated exposure control, adjustment of the mA and/or kV according to  patient size and/or use of iterative reconstruction technique. COMPARISON:  MRI brain 04/20/2020. FINDINGS: CT HEAD FINDINGS Brain: No evidence of acute infarction, hemorrhage, hydrocephalus, extra-axial collection or mass lesion/mass effect. There is stable mild diffuse atrophy with compensatory dilatation of the ventricular system. There is stable mild periventricular white matter hypodensity, likely chronic small vessel ischemic change. Vascular: Atherosclerotic calcifications are present within the cavernous internal carotid arteries. Skull: Normal. Negative for fracture or focal lesion. Sinuses/Orbits: There is mucosal thickening of the ethmoid air cells, right frontal sinus and bilateral maxillary sinuses. Mastoid air cells are clear. Orbits are grossly within normal limits. Other: None. CT CERVICAL SPINE FINDINGS Alignment: Normal. Skull base and vertebrae: No acute fracture. No primary bone lesion or focal pathologic process. Bones are diffusely osteopenic. Soft tissues and spinal canal: No prevertebral fluid or swelling. No visible canal hematoma. Disc levels: There is intervertebral disc space narrowing and endplate osteophyte formation throughout the cervical spine compatible with degenerative change. This is most significant at C5-C6 and C6-C7. There is no severe central canal or neural foraminal stenosis at any level. There is some mild to moderate central canal stenosis at C6-C7 secondary to posterior disc osteophyte complex. There is calcified pannus formation at the level of C2. Upper chest: Negative. Other: None. IMPRESSION: 1.  No acute intracranial process. 2. No  acute fracture or traumatic subluxation of the cervical spine. Electronically Signed   By: Ronney Asters M.D.   On: 01/31/2021 21:17   CT Cervical Spine Wo Contrast  Result Date: 01/31/2021 CLINICAL DATA:  Trauma. EXAM: CT HEAD WITHOUT CONTRAST CT CERVICAL SPINE WITHOUT CONTRAST TECHNIQUE: Multidetector CT imaging of the head and  cervical spine was performed following the standard protocol without intravenous contrast. Multiplanar CT image reconstructions of the cervical spine were also generated. RADIATION DOSE REDUCTION: This exam was performed according to the departmental dose-optimization program which includes automated exposure control, adjustment of the mA and/or kV according to patient size and/or use of iterative reconstruction technique. COMPARISON:  MRI brain 04/20/2020. FINDINGS: CT HEAD FINDINGS Brain: No evidence of acute infarction, hemorrhage, hydrocephalus, extra-axial collection or mass lesion/mass effect. There is stable mild diffuse atrophy with compensatory dilatation of the ventricular system. There is stable mild periventricular white matter hypodensity, likely chronic small vessel ischemic change. Vascular: Atherosclerotic calcifications are present within the cavernous internal carotid arteries. Skull: Normal. Negative for fracture or focal lesion. Sinuses/Orbits: There is mucosal thickening of the ethmoid air cells, right frontal sinus and bilateral maxillary sinuses. Mastoid air cells are clear. Orbits are grossly within normal limits. Other: None. CT CERVICAL SPINE FINDINGS Alignment: Normal. Skull base and vertebrae: No acute fracture. No primary bone lesion or focal pathologic process. Bones are diffusely osteopenic. Soft tissues and spinal canal: No prevertebral fluid or swelling. No visible canal hematoma. Disc levels: There is intervertebral disc space narrowing and endplate osteophyte formation throughout the cervical spine compatible with degenerative change. This is most significant at C5-C6 and C6-C7. There is no severe central canal or neural foraminal stenosis at any level. There is some mild to moderate central canal stenosis at C6-C7 secondary to posterior disc osteophyte complex. There is calcified pannus formation at the level of C2. Upper chest: Negative. Other: None. IMPRESSION: 1.  No acute  intracranial process. 2. No acute fracture or traumatic subluxation of the cervical spine. Electronically Signed   By: Ronney Asters M.D.   On: 01/31/2021 21:17   DG Shoulder Left  Result Date: 01/31/2021 CLINICAL DATA:  Post fall with left arm pain. EXAM: LEFT SHOULDER - 2+ VIEW COMPARISON:  None. FINDINGS: Comminuted fracture of the proximal humerus. Fracture involves the surgical neck and lateral humeral head. There is displacement of greater than 1 cm. No convincing intra-articular involvement. No humeral head dislocation. Acromioclavicular degenerative change. IMPRESSION: Comminuted and displaced proximal humerus fracture involving the surgical neck and lateral humeral head. Electronically Signed   By: Keith Rake M.D.   On: 01/31/2021 21:06   DG Knee Complete 4 Views Left  Result Date: 01/31/2021 CLINICAL DATA:  Post fall with left knee pain. EXAM: LEFT KNEE - COMPLETE 4+ VIEW COMPARISON:  None. FINDINGS: No fracture or dislocation. Small knee joint effusion. Mild tricompartmental osteoarthritis. There is prominent chondrocalcinosis. Advanced vascular calcifications. IMPRESSION: 1. No fracture or dislocation of the left knee. 2. Mild tricompartmental osteoarthritis and chondrocalcinosis. 3. Small knee joint effusion. Electronically Signed   By: Keith Rake M.D.   On: 01/31/2021 21:07     Subjective: No complaints  Discharge Exam: Vitals:   02/03/21 2056 02/04/21 0530  BP: 114/61 140/88  Pulse: 78 89  Resp: 16 20  Temp: 98.5 F (36.9 C) 98.4 F (36.9 C)  SpO2: 99% 98%   Vitals:   02/03/21 1325 02/03/21 2056 02/04/21 0500 02/04/21 0530  BP: 97/61 114/61  140/88  Pulse: 76 78  89  Resp: 16 16  20   Temp: 98.3 F (36.8 C) 98.5 F (36.9 C)  98.4 F (36.9 C)  TempSrc: Axillary Oral  Oral  SpO2: 97% 99%  98%  Weight:   100.6 kg   Height:        General: Pt is alert, awake, not in acute distress Cardiovascular: RRR, S1/S2 +, no rubs, no gallops Respiratory: CTA  bilaterally, no wheezing, no rhonchi Abdominal: Soft, NT, ND, bowel sounds + Extremities: no edema, no cyanosis    The results of significant diagnostics from this hospitalization (including imaging, microbiology, ancillary and laboratory) are listed below for reference.     Microbiology: Recent Results (from the past 240 hour(s))  Resp Panel by RT-PCR (Flu A&B, Covid) Nasopharyngeal Swab     Status: None   Collection Time: 02/01/21  1:00 AM   Specimen: Nasopharyngeal Swab; Nasopharyngeal(NP) swabs in vial transport medium  Result Value Ref Range Status   SARS Coronavirus 2 by RT PCR NEGATIVE NEGATIVE Final    Comment: (NOTE) SARS-CoV-2 target nucleic acids are NOT DETECTED.  The SARS-CoV-2 RNA is generally detectable in upper respiratory specimens during the acute phase of infection. The lowest concentration of SARS-CoV-2 viral copies this assay can detect is 138 copies/mL. A negative result does not preclude SARS-Cov-2 infection and should not be used as the sole basis for treatment or other patient management decisions. A negative result may occur with  improper specimen collection/handling, submission of specimen other than nasopharyngeal swab, presence of viral mutation(s) within the areas targeted by this assay, and inadequate number of viral copies(<138 copies/mL). A negative result must be combined with clinical observations, patient history, and epidemiological information. The expected result is Negative.  Fact Sheet for Patients:  EntrepreneurPulse.com.au  Fact Sheet for Healthcare Providers:  IncredibleEmployment.be  This test is no t yet approved or cleared by the [place] FDA and  has been authorized for detection and/or diagnosis of SARS-CoV-2 by FDA under an Emergency Use Authorization (EUA). This EUA will remain  in effect (meaning this test can be used) for the duration of the COVID-19 declaration under Section  564(b)(1) of the Act, 21 U.S.C.section 360bbb-3(b)(1), unless the authorization is terminated  or revoked sooner.       Influenza A by PCR NEGATIVE NEGATIVE Final   Influenza B by PCR NEGATIVE NEGATIVE Final    Comment: (NOTE) The Xpert Xpress SARS-CoV-2/FLU/RSV plus assay is intended as an aid in the diagnosis of influenza from Nasopharyngeal swab specimens and should not be used as a sole basis for treatment. Nasal washings and aspirates are unacceptable for Xpert Xpress SARS-CoV-2/FLU/RSV testing.  Fact Sheet for Patients: EntrepreneurPulse.com.au  Fact Sheet for Healthcare Providers: IncredibleEmployment.be  This test is not yet approved or cleared by the [place] FDA and has been authorized for detection and/or diagnosis of SARS-CoV-2 by FDA under an Emergency Use Authorization (EUA). This EUA will remain in effect (meaning this test can be used) for the duration of the COVID-19 declaration under Section 564(b)(1) of the Act, 21 U.S.C. section 360bbb-3(b)(1), unless the authorization is terminated or revoked.  Performed at [facility], [place] [address] [place], [place] [address]   Resp Panel by RT-PCR (Flu A&B, Covid) Nasopharyngeal Swab     Status: None   Collection Time: 02/02/21 12:24 PM   Specimen: Nasopharyngeal Swab; Nasopharyngeal(NP) swabs in vial transport medium  Result Value Ref Range Status   SARS Coronavirus 2 by RT PCR NEGATIVE NEGATIVE Final  Comment: (NOTE) SARS-CoV-2 target nucleic acids are NOT DETECTED.  The SARS-CoV-2 RNA is generally detectable in upper respiratory specimens during the acute phase of infection. The lowest concentration of SARS-CoV-2 viral copies this assay can detect is 138 copies/mL. A negative result does not preclude SARS-Cov-2 infection and should not be used as the sole basis for treatment or other patient management decisions. A negative result may occur with   improper specimen collection/handling, submission of specimen other than nasopharyngeal swab, presence of viral mutation(s) within the areas targeted by this assay, and inadequate number of viral copies(<138 copies/mL). A negative result must be combined with clinical observations, patient history, and epidemiological information. The expected result is Negative.  Fact Sheet for Patients:  EntrepreneurPulse.com.au  Fact Sheet for Healthcare Providers:  IncredibleEmployment.be  This test is no t yet approved or cleared by the [place] FDA and  has been authorized for detection and/or diagnosis of SARS-CoV-2 by FDA under an Emergency Use Authorization (EUA). This EUA will remain  in effect (meaning this test can be used) for the duration of the COVID-19 declaration under Section 564(b)(1) of the Act, 21 U.S.C.section 360bbb-3(b)(1), unless the authorization is terminated  or revoked sooner.       Influenza A by PCR NEGATIVE NEGATIVE Final   Influenza B by PCR NEGATIVE NEGATIVE Final    Comment: (NOTE) The Xpert Xpress SARS-CoV-2/FLU/RSV plus assay is intended as an aid in the diagnosis of influenza from Nasopharyngeal swab specimens and should not be used as a sole basis for treatment. Nasal washings and aspirates are unacceptable for Xpert Xpress SARS-CoV-2/FLU/RSV testing.  Fact Sheet for Patients: EntrepreneurPulse.com.au  Fact Sheet for Healthcare Providers: IncredibleEmployment.be  This test is not yet approved or cleared by the [place] FDA and has been authorized for detection and/or diagnosis of SARS-CoV-2 by FDA under an Emergency Use Authorization (EUA). This EUA will remain in effect (meaning this test can be used) for the duration of the COVID-19 declaration under Section 564(b)(1) of the Act, 21 U.S.C. section 360bbb-3(b)(1), unless the authorization is terminated  or revoked.  Performed at [facility], [place] [address], [place], [place] [address]      Labs: BNP (last 3 results) No results for input(s): BNP in the last 8760 hours. Basic Metabolic Panel: Recent Labs  Lab 01/31/21 2158 02/01/21 0551  NA 137 137  K 4.5 4.5  CL 106 104  CO2 26 25  GLUCOSE 181* 173*  BUN 28* 31*  CREATININE 1.46* 1.44*  CALCIUM 8.8* 8.8*   Liver Function Tests: Recent Labs  Lab 01/31/21 2158 02/01/21 0551  AST 26 30  ALT 18 19  ALKPHOS 96 88  BILITOT 1.2 1.6*  PROT 6.5 6.4*  ALBUMIN 3.9 3.7   No results for input(s): LIPASE, AMYLASE in the last 168 hours. No results for input(s): AMMONIA in the last 168 hours. CBC: Recent Labs  Lab 01/31/21 2158 02/01/21 0551  WBC 10.6* 11.0*  NEUTROABS 8.6*  --   HGB 13.1 12.8*  HCT 38.8* 37.1*  MCV 103.5* 103.9*  PLT 151 146*   Cardiac Enzymes: Recent Labs  Lab 01/31/21 2158  CKTOTAL 133   BNP: Invalid input(s): POCBNP CBG: No results for input(s): GLUCAP in the last 168 hours. D-Dimer No results for input(s): DDIMER in the last 72 hours. Hgb A1c No results for input(s): HGBA1C in the last 72 hours. Lipid Profile No results for input(s): CHOL, HDL, LDLCALC, TRIG, CHOLHDL, LDLDIRECT in the last 72 hours. Thyroid  function studies Recent Labs    02/02/21 0400  TSH 2.816   Anemia work up No results for input(s): VITAMINB12, FOLATE, FERRITIN, TIBC, IRON, RETICCTPCT in the last 72 hours. Urinalysis    Component Value Date/Time   COLORURINE YELLOW 03/20/2020 1734   APPEARANCEUR HAZY (A) 03/20/2020 1734   LABSPEC 1.017 03/20/2020 1734   PHURINE 5.0 03/20/2020 1734   GLUCOSEU NEGATIVE 03/20/2020 1734   HGBUR NEGATIVE 03/20/2020 1734   BILIRUBINUR NEGATIVE 03/20/2020 1734   KETONESUR NEGATIVE 03/20/2020 1734   PROTEINUR NEGATIVE 03/20/2020 1734   UROBILINOGEN 0.2 06/17/2010 1608   NITRITE NEGATIVE 03/20/2020 1734   LEUKOCYTESUR NEGATIVE 03/20/2020 1734   Sepsis  Labs Invalid input(s): PROCALCITONIN,  WBC,  LACTICIDVEN Microbiology Recent Results (from the past 240 hour(s))  Resp Panel by RT-PCR (Flu A&B, Covid) Nasopharyngeal Swab     Status: None   Collection Time: 02/01/21  1:00 AM   Specimen: Nasopharyngeal Swab; Nasopharyngeal(NP) swabs in vial transport medium  Result Value Ref Range Status   SARS Coronavirus 2 by RT PCR NEGATIVE NEGATIVE Final    Comment: (NOTE) SARS-CoV-2 target nucleic acids are NOT DETECTED.  The SARS-CoV-2 RNA is generally detectable in upper respiratory specimens during the acute phase of infection. The lowest concentration of SARS-CoV-2 viral copies this assay can detect is 138 copies/mL. A negative result does not preclude SARS-Cov-2 infection and should not be used as the sole basis for treatment or other patient management decisions. A negative result may occur with  improper specimen collection/handling, submission of specimen other than nasopharyngeal swab, presence of viral mutation(s) within the areas targeted by this assay, and inadequate number of viral copies(<138 copies/mL). A negative result must be combined with clinical observations, patient history, and epidemiological information. The expected result is Negative.  Fact Sheet for Patients:  EntrepreneurPulse.com.au  Fact Sheet for Healthcare Providers:  IncredibleEmployment.be  This test is no t yet approved or cleared by the [place] FDA and  has been authorized for detection and/or diagnosis of SARS-CoV-2 by FDA under an Emergency Use Authorization (EUA). This EUA will remain  in effect (meaning this test can be used) for the duration of the COVID-19 declaration under Section 564(b)(1) of the Act, 21 U.S.C.section 360bbb-3(b)(1), unless the authorization is terminated  or revoked sooner.       Influenza A by PCR NEGATIVE NEGATIVE Final   Influenza B by PCR NEGATIVE NEGATIVE Final    Comment:  (NOTE) The Xpert Xpress SARS-CoV-2/FLU/RSV plus assay is intended as an aid in the diagnosis of influenza from Nasopharyngeal swab specimens and should not be used as a sole basis for treatment. Nasal washings and aspirates are unacceptable for Xpert Xpress SARS-CoV-2/FLU/RSV testing.  Fact Sheet for Patients: EntrepreneurPulse.com.au  Fact Sheet for Healthcare Providers: IncredibleEmployment.be  This test is not yet approved or cleared by the [place] FDA and has been authorized for detection and/or diagnosis of SARS-CoV-2 by FDA under an Emergency Use Authorization (EUA). This EUA will remain in effect (meaning this test can be used) for the duration of the COVID-19 declaration under Section 564(b)(1) of the Act, 21 U.S.C. section 360bbb-3(b)(1), unless the authorization is terminated or revoked.  Performed at [facility], [place] [address] [place], [place] [address]   Resp Panel by RT-PCR (Flu A&B, Covid) Nasopharyngeal Swab     Status: None   Collection Time: 02/02/21 12:24 PM   Specimen: Nasopharyngeal Swab; Nasopharyngeal(NP) swabs in vial transport medium  Result Value Ref Range Status  SARS Coronavirus 2 by RT PCR NEGATIVE NEGATIVE Final    Comment: (NOTE) SARS-CoV-2 target nucleic acids are NOT DETECTED.  The SARS-CoV-2 RNA is generally detectable in upper respiratory specimens during the acute phase of infection. The lowest concentration of SARS-CoV-2 viral copies this assay can detect is 138 copies/mL. A negative result does not preclude SARS-Cov-2 infection and should not be used as the sole basis for treatment or other patient management decisions. A negative result may occur with  improper specimen collection/handling, submission of specimen other than nasopharyngeal swab, presence of viral mutation(s) within the areas targeted by this assay, and inadequate number of viral copies(<138 copies/mL). A  negative result must be combined with clinical observations, patient history, and epidemiological information. The expected result is Negative.  Fact Sheet for Patients:  EntrepreneurPulse.com.au  Fact Sheet for Healthcare Providers:  IncredibleEmployment.be  This test is no t yet approved or cleared by the [place] FDA and  has been authorized for detection and/or diagnosis of SARS-CoV-2 by FDA under an Emergency Use Authorization (EUA). This EUA will remain  in effect (meaning this test can be used) for the duration of the COVID-19 declaration under Section 564(b)(1) of the Act, 21 U.S.C.section 360bbb-3(b)(1), unless the authorization is terminated  or revoked sooner.       Influenza A by PCR NEGATIVE NEGATIVE Final   Influenza B by PCR NEGATIVE NEGATIVE Final    Comment: (NOTE) The Xpert Xpress SARS-CoV-2/FLU/RSV plus assay is intended as an aid in the diagnosis of influenza from Nasopharyngeal swab specimens and should not be used as a sole basis for treatment. Nasal washings and aspirates are unacceptable for Xpert Xpress SARS-CoV-2/FLU/RSV testing.  Fact Sheet for Patients: EntrepreneurPulse.com.au  Fact Sheet for Healthcare Providers: IncredibleEmployment.be  This test is not yet approved or cleared by the [place] FDA and has been authorized for detection and/or diagnosis of SARS-CoV-2 by FDA under an Emergency Use Authorization (EUA). This EUA will remain in effect (meaning this test can be used) for the duration of the COVID-19 declaration under Section 564(b)(1) of the Act, 21 U.S.C. section 360bbb-3(b)(1), unless the authorization is terminated or revoked.  Performed at [facility], [place] [address], [place], [place] [address]       SIGNED:   Charlynne Cousins, MD  [facility] 02/04/2021, 9:08 AM Pager   If 7PM-7AM, please contact  night-coverage [email or web address] Password [email or web address]

## 2021-02-05 DIAGNOSIS — S42212A Unspecified displaced fracture of surgical neck of left humerus, initial encounter for closed fracture: Secondary | ICD-10-CM | POA: Diagnosis not present

## 2021-02-05 DIAGNOSIS — I10 Essential (primary) hypertension: Secondary | ICD-10-CM | POA: Diagnosis not present

## 2021-02-05 MED ORDER — SORBITOL 70 % SOLN
60.0000 mL | Freq: Once | Status: AC
  Administered 2021-02-05: 60 mL via ORAL
  Filled 2021-02-05: qty 60

## 2021-02-05 NOTE — Progress Notes (Signed)
Physical Therapy Treatment Patient Details Name: Shawn Sanchez MRN: [id number] DOB: [birth year] Today's Date: 02/05/2021   History of Present Illness Patient is an 88 year old male that fell resulting in a L humerus fracture. PMH includes TIA, radiculopathy status post laminectomy, hypothyroidism, essential hypertension, CAD status post angioplasty, anemia, alcohol abuse    PT Comments    Patient pleasant and agreeable to mobilize with therapy. Requires reminders for NWB of Lt UE and sling adjusted at start of session for proper fit. Pt required Mod assist to sit up to EOB and 1HHA with Min-Mod A to initiate power up. Pt steady once standing with single UE support but c/o dizziness. Able to take small side steps at EOB but unable to progress gait as pt's dizziness increased. Pt return to supine and position in chair position in bed. BP 118/86 mmHg with HR of 101 bpm. Acute PT will continue to progress as able. Continue to recommend SNF rehab.    Recommendations for follow up therapy are one component of a multi-disciplinary discharge planning process, led by the attending physician.  Recommendations may be updated based on patient status, additional functional criteria and insurance authorization.  Follow Up Recommendations  Skilled nursing-short term rehab (<3 hours/day)     Assistance Recommended at Discharge Frequent or constant Supervision/Assistance  Patient can return home with the following     Equipment Recommendations       Recommendations for Other Services       Precautions / Restrictions Precautions Precautions: Shoulder Shoulder Interventions: Shoulder sling/immobilizer;At all times Precaution Comments: sling at all times except for bathing and dressing. He can perform gentle ROM of hand, wrist, and elbow Required Braces or Orthoses: Sling Restrictions Weight Bearing Restrictions: Yes LUE Weight Bearing: Non weight bearing     Mobility  Bed Mobility Overal bed  mobility: Needs Assistance Bed Mobility: Supine to Sit;Sit to Supine     Supine to sit: Mod assist;HOB elevated;+2 for safety/equipment Sit to supine: HOB elevated;+2 for physical assistance;+2 for safety/equipment;Max assist;Mod assist   General bed mobility comments: Assist with pt using Rt UE to pull up and therapist helping to raise trunk with support at back. bed pad to pivot hips to sit up EOB. 2+ Max assist to return to supine and reposition in bed. pt required assist to raise legs and control lowering trunk since he cannot use Lt UE.    Transfers Overall transfer level: Needs assistance Equipment used: 1 person hand held assist Transfers: Sit to/from Stand Sit to Stand: Min assist;Mod assist;+2 safety/equipment           General transfer comment: Min-Mod 1HHA for rise form EOB with 2+ for safety/guarding on Lt side. Pt completed 3x from EOB. able to take small sdie steps along edge, c/o dizziness/lightheaded feeling that improved with sitting. unsafe to progress gait further.    Ambulation/Gait                   Stairs             Wheelchair Mobility    Modified Rankin (Stroke Patients Only)       Balance Overall balance assessment: Needs assistance Sitting-balance support: No upper extremity supported;Feet supported Sitting balance-Leahy Scale: Fair     Standing balance support: Single extremity supported;During functional activity Standing balance-Leahy Scale: Poor Standing balance comment: reliant on external support for balance  Cognition Arousal/Alertness: Awake/alert Behavior During Therapy: WFL for tasks assessed/performed Overall Cognitive Status: History of cognitive impairments - at baseline                                 General Comments: pt with history of dementia but pleasant and followed cues safely with slightly incresaed time.        Exercises      General Comments         Pertinent Vitals/Pain Pain Assessment: Faces Faces Pain Scale: Hurts a little bit Pain Location: L shoulder/arm Pain Descriptors / Indicators: Aching;Sore;Discomfort Pain Intervention(s): Limited activity within patient's tolerance;Monitored during session;Repositioned    Home Living                          Prior Function            PT Goals (current goals can now be found in the care plan section) Acute Rehab PT Goals Patient Stated Goal: unable to state PT Goal Formulation: Patient unable to participate in goal setting Time For Goal Achievement: 02/15/21 Potential to Achieve Goals: Good Progress towards PT goals: Progressing toward goals    Frequency    Min 2X/week      PT Plan Current plan remains appropriate    Co-evaluation              AM-PAC PT "6 Clicks" Mobility   Outcome Measure  Help needed turning from your back to your side while in a flat bed without using bedrails?: A Little Help needed moving from lying on your back to sitting on the side of a flat bed without using bedrails?: A Little Help needed moving to and from a bed to a chair (including a wheelchair)?: A Lot Help needed standing up from a chair using your arms (e.g., wheelchair or bedside chair)?: A Lot Help needed to walk in hospital room?: A Lot Help needed climbing 3-5 steps with a railing? : Total 6 Click Score: 13    End of Session Equipment Utilized During Treatment: Gait belt Activity Tolerance: Patient tolerated treatment well Patient left: in bed;with call bell/phone within reach;with bed alarm set;Other (comment) (chair position) Nurse Communication: Mobility status PT Visit Diagnosis: Unsteadiness on feet (R26.81);Muscle weakness (generalized) (M62.81);Difficulty in walking, not elsewhere classified (R26.2);Pain;History of falling (Z91.81);Repeated falls (R29.6) Pain - Right/Left: Left Pain - part of body: Arm     Time: 4098-1191 PT Time Calculation  (min) (ACUTE ONLY): 22 min  Charges:  $Therapeutic Activity: 8-22 mins                     Wynn Maudlin, DPT Acute Rehabilitation Services Office [phone] Pager [phone]    Anitra Lauth 02/05/2021, 11:49 AM

## 2021-02-05 NOTE — Progress Notes (Signed)
[facility] PROGRESS NOTE    Progress Note  Shawn Sanchez  [id number] DOB: Sep 16, 1931 DOA: 01/31/2021 PCP: Shon Baton, MD     Brief Narrative:   Shawn Sanchez is an 88 y.o. male past medical history significant for TIA, radiculopathy status post laminectomy hypothyroidism, essential hypertension CAD status post angioplasty anemia alcohol abuse who presents from home after fall when walking to the mailbox and injured his shoulder started having significant arm pain and swelling.  Chest x-ray showed mild vascular congestion and left humerus fracture, left shoulder x-ray showed left humerus fracture comminuted and displaced left knee x-ray showed no acute abnormalities.  CT of the head and C-spine with no acute abnormalities.  Orthopedic surgery was consulted   Assessment/Plan:   Mechanical fall leading to a comminuted right humerus fracture: T-max 98.3. Still in pain but he relates narcotics are working. Continue MiraLAX p.o. twice daily. Patient is medically stable to be transferred to skilled nursing facility.  Essential hypertension: Currently on no antihypertensive medication.  Hyperlipidemia/CAD/history of TIA: Resume Plavix continue statins.  Chronic kidney disease stage IIIb: His creatinine has remained at baseline.  BPH: Continue Flomax.  GERD: Continue PPI.  Hypothyroidism: Continue Synthroid.  Insomnia: Continue melatonin.  Mild dementia: Noted  DVT prophylaxis: lovenox Family Communication:none Status is: Inpatient  Remains inpatient appropriate because: Acute humeral fracture, physical therapy evaluated the patient patient is medically stable to be transferred to skilled nursing facility.    Code Status:     Code Status Orders  (From admission, onward)           Start     Ordered   01/31/21 2357  Full code  Continuous        01/31/21 2359           Code Status History     Date Active Date Inactive Code Status Order  ID Comments User Context   09/27/2015 1335 09/28/2015 2252 Full Code [id number]  Eustace Moore, MD Inpatient         IV Access:   Peripheral IV   Procedures and diagnostic studies:   No results found.   Medical Consultants:   None.   Subjective:    Shawn Sanchez confused this morning  Objective:    Vitals:   02/04/21 1413 02/04/21 1952 02/05/21 0500 02/05/21 0512  BP: 112/76 130/84  (!) 151/79  Pulse: 84 73  89  Resp: 20 16  20   Temp: 98.4 F (36.9 C) 98.3 F (36.8 C)  97.8 F (36.6 C)  TempSrc: Oral Oral  Oral  SpO2: 99% 99%  100%  Weight:   100.2 kg   Height:       SpO2: 100 %   Intake/Output Summary (Last 24 hours) at 02/05/2021 0912 Last data filed at 02/05/2021 0500 Gross per 24 hour  Intake --  Output 1300 ml  Net -1300 ml    Filed Weights   02/03/21 0500 02/04/21 0500 02/05/21 0500  Weight: 100.3 kg 100.6 kg 100.2 kg    Exam: General exam: In no acute distress. Respiratory system: Good air movement and clear to auscultation. Cardiovascular system: S1 & S2 heard, RRR. No JVD. Gastrointestinal system: Abdomen is nondistended, soft and nontender.  Extremities: No pedal edema. Skin: No rashes, lesions or ulcers Psychiatry: Confused this morning   Data Reviewed:    Labs: Basic Metabolic Panel: Recent Labs  Lab 01/31/21 2158 02/01/21 0551  NA 137 137  K 4.5 4.5  CL 106 104  CO2 26 25  GLUCOSE 181* 173*  BUN 28* 31*  CREATININE 1.46* 1.44*  CALCIUM 8.8* 8.8*    GFR Estimated Creatinine Clearance: 41.5 mL/min (A) (by C-G formula based on SCr of 1.44 mg/dL (H)). Liver Function Tests: Recent Labs  Lab 01/31/21 2158 02/01/21 0551  AST 26 30  ALT 18 19  ALKPHOS 96 88  BILITOT 1.2 1.6*  PROT 6.5 6.4*  ALBUMIN 3.9 3.7    No results for input(s): LIPASE, AMYLASE in the last 168 hours. No results for input(s): AMMONIA in the last 168 hours. Coagulation profile Recent Labs  Lab 02/01/21 0551  INR 1.2    COVID-19  Labs  No results for input(s): DDIMER, FERRITIN, LDH, CRP in the last 72 hours.  Lab Results  Component Value Date   SARSCOV2NAA NEGATIVE 02/02/2021   Arial NEGATIVE 02/01/2021   [place] Not Detected 11/27/2018    CBC: Recent Labs  Lab 01/31/21 2158 02/01/21 0551  WBC 10.6* 11.0*  NEUTROABS 8.6*  --   HGB 13.1 12.8*  HCT 38.8* 37.1*  MCV 103.5* 103.9*  PLT 151 146*    Cardiac Enzymes: Recent Labs  Lab 01/31/21 2158  CKTOTAL 133    BNP (last 3 results) No results for input(s): PROBNP in the last 8760 hours. CBG: No results for input(s): GLUCAP in the last 168 hours. D-Dimer: No results for input(s): DDIMER in the last 72 hours. Hgb A1c: No results for input(s): HGBA1C in the last 72 hours. Lipid Profile: No results for input(s): CHOL, HDL, LDLCALC, TRIG, CHOLHDL, LDLDIRECT in the last 72 hours. Thyroid function studies: No results for input(s): TSH, T4TOTAL, T3FREE, THYROIDAB in the last 72 hours.  Invalid input(s): FREET3  Anemia work up: No results for input(s): VITAMINB12, FOLATE, FERRITIN, TIBC, IRON, RETICCTPCT in the last 72 hours. Sepsis Labs: Recent Labs  Lab 01/31/21 2158 02/01/21 0551  WBC 10.6* 11.0*    Microbiology Recent Results (from the past 240 hour(s))  Resp Panel by RT-PCR (Flu A&B, Covid) Nasopharyngeal Swab     Status: None   Collection Time: 02/01/21  1:00 AM   Specimen: Nasopharyngeal Swab; Nasopharyngeal(NP) swabs in vial transport medium  Result Value Ref Range Status   SARS Coronavirus 2 by RT PCR NEGATIVE NEGATIVE Final    Comment: (NOTE) SARS-CoV-2 target nucleic acids are NOT DETECTED.  The SARS-CoV-2 RNA is generally detectable in upper respiratory specimens during the acute phase of infection. The lowest concentration of SARS-CoV-2 viral copies this assay can detect is 138 copies/mL. A negative result does not preclude SARS-Cov-2 infection and should not be used as the sole basis for treatment or other  patient management decisions. A negative result may occur with  improper specimen collection/handling, submission of specimen other than nasopharyngeal swab, presence of viral mutation(s) within the areas targeted by this assay, and inadequate number of viral copies(<138 copies/mL). A negative result must be combined with clinical observations, patient history, and epidemiological information. The expected result is Negative.  Fact Sheet for Patients:  EntrepreneurPulse.com.au  Fact Sheet for Healthcare Providers:  IncredibleEmployment.be  This test is no t yet approved or cleared by the [place] FDA and  has been authorized for detection and/or diagnosis of SARS-CoV-2 by FDA under an Emergency Use Authorization (EUA). This EUA will remain  in effect (meaning this test can be used) for the duration of the COVID-19 declaration under Section 564(b)(1) of the Act, 21 U.S.C.section 360bbb-3(b)(1), unless the authorization is terminated  or revoked sooner.  Influenza A by PCR NEGATIVE NEGATIVE Final   Influenza B by PCR NEGATIVE NEGATIVE Final    Comment: (NOTE) The Xpert Xpress SARS-CoV-2/FLU/RSV plus assay is intended as an aid in the diagnosis of influenza from Nasopharyngeal swab specimens and should not be used as a sole basis for treatment. Nasal washings and aspirates are unacceptable for Xpert Xpress SARS-CoV-2/FLU/RSV testing.  Fact Sheet for Patients: EntrepreneurPulse.com.au  Fact Sheet for Healthcare Providers: IncredibleEmployment.be  This test is not yet approved or cleared by the [place] FDA and has been authorized for detection and/or diagnosis of SARS-CoV-2 by FDA under an Emergency Use Authorization (EUA). This EUA will remain in effect (meaning this test can be used) for the duration of the COVID-19 declaration under Section 564(b)(1) of the Act, 21 U.S.C. section  360bbb-3(b)(1), unless the authorization is terminated or revoked.  Performed at [facility], [place] [address] [place], [place] [address]   Resp Panel by RT-PCR (Flu A&B, Covid) Nasopharyngeal Swab     Status: None   Collection Time: 02/02/21 12:24 PM   Specimen: Nasopharyngeal Swab; Nasopharyngeal(NP) swabs in vial transport medium  Result Value Ref Range Status   SARS Coronavirus 2 by RT PCR NEGATIVE NEGATIVE Final    Comment: (NOTE) SARS-CoV-2 target nucleic acids are NOT DETECTED.  The SARS-CoV-2 RNA is generally detectable in upper respiratory specimens during the acute phase of infection. The lowest concentration of SARS-CoV-2 viral copies this assay can detect is 138 copies/mL. A negative result does not preclude SARS-Cov-2 infection and should not be used as the sole basis for treatment or other patient management decisions. A negative result may occur with  improper specimen collection/handling, submission of specimen other than nasopharyngeal swab, presence of viral mutation(s) within the areas targeted by this assay, and inadequate number of viral copies(<138 copies/mL). A negative result must be combined with clinical observations, patient history, and epidemiological information. The expected result is Negative.  Fact Sheet for Patients:  EntrepreneurPulse.com.au  Fact Sheet for Healthcare Providers:  IncredibleEmployment.be  This test is no t yet approved or cleared by the [place] FDA and  has been authorized for detection and/or diagnosis of SARS-CoV-2 by FDA under an Emergency Use Authorization (EUA). This EUA will remain  in effect (meaning this test can be used) for the duration of the COVID-19 declaration under Section 564(b)(1) of the Act, 21 U.S.C.section 360bbb-3(b)(1), unless the authorization is terminated  or revoked sooner.       Influenza A by PCR NEGATIVE NEGATIVE Final   Influenza B by  PCR NEGATIVE NEGATIVE Final    Comment: (NOTE) The Xpert Xpress SARS-CoV-2/FLU/RSV plus assay is intended as an aid in the diagnosis of influenza from Nasopharyngeal swab specimens and should not be used as a sole basis for treatment. Nasal washings and aspirates are unacceptable for Xpert Xpress SARS-CoV-2/FLU/RSV testing.  Fact Sheet for Patients: EntrepreneurPulse.com.au  Fact Sheet for Healthcare Providers: IncredibleEmployment.be  This test is not yet approved or cleared by the [place] FDA and has been authorized for detection and/or diagnosis of SARS-CoV-2 by FDA under an Emergency Use Authorization (EUA). This EUA will remain in effect (meaning this test can be used) for the duration of the COVID-19 declaration under Section 564(b)(1) of the Act, 21 U.S.C. section 360bbb-3(b)(1), unless the authorization is terminated or revoked.  Performed at [facility], [place] [employer] [address] [place], [place] [address]      Medications:    clopidogrel  75 mg Oral Daily  levothyroxine  50 mcg Oral Q0600   pantoprazole  40 mg Oral Daily   polyethylene glycol  17 g Oral BID   simvastatin  20 mg Oral Daily   sodium chloride flush  3 mL Intravenous Q12H   tamsulosin  0.4 mg Oral Daily   Continuous Infusions:    LOS: 5 days   Charlynne Cousins  [facility]  02/05/2021, 9:12 AM

## 2021-02-06 DIAGNOSIS — R2689 Other abnormalities of gait and mobility: Secondary | ICD-10-CM | POA: Diagnosis not present

## 2021-02-06 DIAGNOSIS — I2510 Atherosclerotic heart disease of native coronary artery without angina pectoris: Secondary | ICD-10-CM | POA: Diagnosis not present

## 2021-02-06 DIAGNOSIS — K219 Gastro-esophageal reflux disease without esophagitis: Secondary | ICD-10-CM | POA: Diagnosis not present

## 2021-02-06 DIAGNOSIS — R262 Difficulty in walking, not elsewhere classified: Secondary | ICD-10-CM | POA: Diagnosis not present

## 2021-02-06 DIAGNOSIS — E785 Hyperlipidemia, unspecified: Secondary | ICD-10-CM | POA: Diagnosis not present

## 2021-02-06 DIAGNOSIS — R404 Transient alteration of awareness: Secondary | ICD-10-CM | POA: Diagnosis not present

## 2021-02-06 DIAGNOSIS — S42422D Displaced comminuted supracondylar fracture without intercondylar fracture of left humerus, subsequent encounter for fracture with routine healing: Secondary | ICD-10-CM | POA: Diagnosis not present

## 2021-02-06 DIAGNOSIS — L89312 Pressure ulcer of right buttock, stage 2: Secondary | ICD-10-CM | POA: Diagnosis not present

## 2021-02-06 DIAGNOSIS — I10 Essential (primary) hypertension: Secondary | ICD-10-CM | POA: Diagnosis not present

## 2021-02-06 DIAGNOSIS — S42212A Unspecified displaced fracture of surgical neck of left humerus, initial encounter for closed fracture: Secondary | ICD-10-CM | POA: Diagnosis not present

## 2021-02-06 DIAGNOSIS — N400 Enlarged prostate without lower urinary tract symptoms: Secondary | ICD-10-CM | POA: Diagnosis not present

## 2021-02-06 DIAGNOSIS — R279 Unspecified lack of coordination: Secondary | ICD-10-CM | POA: Diagnosis not present

## 2021-02-06 DIAGNOSIS — M6281 Muscle weakness (generalized): Secondary | ICD-10-CM | POA: Diagnosis not present

## 2021-02-06 DIAGNOSIS — E039 Hypothyroidism, unspecified: Secondary | ICD-10-CM | POA: Diagnosis not present

## 2021-02-06 DIAGNOSIS — M1611 Unilateral primary osteoarthritis, right hip: Secondary | ICD-10-CM | POA: Diagnosis not present

## 2021-02-06 DIAGNOSIS — R197 Diarrhea, unspecified: Secondary | ICD-10-CM | POA: Diagnosis not present

## 2021-02-06 DIAGNOSIS — Z743 Need for continuous supervision: Secondary | ICD-10-CM | POA: Diagnosis not present

## 2021-02-06 DIAGNOSIS — Z23 Encounter for immunization: Secondary | ICD-10-CM | POA: Diagnosis not present

## 2021-02-06 DIAGNOSIS — Z20822 Contact with and (suspected) exposure to covid-19: Secondary | ICD-10-CM | POA: Diagnosis not present

## 2021-02-06 DIAGNOSIS — U071 COVID-19: Secondary | ICD-10-CM | POA: Diagnosis not present

## 2021-02-06 DIAGNOSIS — R296 Repeated falls: Secondary | ICD-10-CM | POA: Diagnosis not present

## 2021-02-06 DIAGNOSIS — S42302A Unspecified fracture of shaft of humerus, left arm, initial encounter for closed fracture: Secondary | ICD-10-CM | POA: Diagnosis not present

## 2021-02-06 DIAGNOSIS — N3946 Mixed incontinence: Secondary | ICD-10-CM | POA: Diagnosis not present

## 2021-02-06 DIAGNOSIS — N401 Enlarged prostate with lower urinary tract symptoms: Secondary | ICD-10-CM | POA: Diagnosis not present

## 2021-02-06 DIAGNOSIS — G4700 Insomnia, unspecified: Secondary | ICD-10-CM | POA: Diagnosis not present

## 2021-02-06 DIAGNOSIS — R41841 Cognitive communication deficit: Secondary | ICD-10-CM | POA: Diagnosis not present

## 2021-02-06 DIAGNOSIS — E559 Vitamin D deficiency, unspecified: Secondary | ICD-10-CM | POA: Diagnosis not present

## 2021-02-06 DIAGNOSIS — S31000S Unspecified open wound of lower back and pelvis without penetration into retroperitoneum, sequela: Secondary | ICD-10-CM | POA: Diagnosis not present

## 2021-02-06 DIAGNOSIS — L89323 Pressure ulcer of left buttock, stage 3: Secondary | ICD-10-CM | POA: Diagnosis not present

## 2021-02-06 DIAGNOSIS — Z9181 History of falling: Secondary | ICD-10-CM | POA: Diagnosis not present

## 2021-02-06 DIAGNOSIS — S42352S Displaced comminuted fracture of shaft of humerus, left arm, sequela: Secondary | ICD-10-CM | POA: Diagnosis not present

## 2021-02-06 NOTE — TOC Transition Note (Signed)
Transition of Care [facility]) - CM/SW Discharge Note   Patient Details  Name: Shawn Sanchez MRN: [id number] Date of Birth: 09/29/1931  [facility]) CM/SW Contact:  Darleene Cleaver, LCSW Phone Number: 02/06/2021, 11:45 AM   Clinical Narrative:    CSW received [facility] number back on patient, [phone].  CSW contacted Delorise Shiner at [facility], and she can accept patient today.  CSW updated patient's friend Al to let him know he is discharging today.     Patient to be d/c'ed today to [facility]).  Patient and family agreeable to plans will transport via ems RN to call report to [phone] room 125.  Patient's friend Al would like to know once EMS arrives so he can bring clothes to SNF, call him at [phone].    Final next level of care: Skilled Nursing Facility Barriers to Discharge: Barriers Resolved   Patient Goals and CMS Choice Patient states their goals for this hospitalization and ongoing recovery are:: To go to SNF for short term rehab, then return back home. CMS Medicare.gov Compare Post Acute Care list provided to:: Patient Represenative (must comment) Choice offered to / list presented to :  (Patient's friend Al)  Discharge Placement PASRR number recieved: 02/06/21            Patient chooses bed at: Other - please specify in the comment section below: [facility])) Patient to be transferred to facility by: [facility] Name of family member notified: Patient's friend Al Patient and family notified of of transfer: 02/06/21  Discharge Plan and Services                                     Social Determinants of Health (SDOH) Interventions     Readmission Risk Interventions No flowsheet data found.

## 2021-02-06 NOTE — Discharge Summary (Signed)
Physician Discharge Summary  Shawn Sanchez [id number] DOB: [birth year] DOA: 01/31/2021  PCP: Shon Baton, MD  Admit date: 01/31/2021 Discharge date: 02/06/2021  Admitted From: Home Disposition:  SNF  Recommendations for Outpatient Follow-up:  Follow up with Ortho in 1-2 weeks Please obtain BMP/CBC in one week   Home Health:No Equipment/Devices:SNF  Discharge Condition:Stable CODE STATUS:Full Diet recommendation: Heart Healthy   Brief/Interim Summary: 88 y.o. male past medical history significant for TIA, radiculopathy status post laminectomy hypothyroidism, essential hypertension CAD status post angioplasty anemia alcohol abuse who presents from home after fall when walking to the mailbox and injured his shoulder started having significant arm pain and swelling.  Chest x-ray showed mild vascular congestion and left humerus fracture, left shoulder x-ray showed left humerus fracture comminuted and displaced left knee x-ray showed no acute abnormalities.  CT of the head and C-spine with no acute abnormalities.  Orthopedic surgery was consulted  Discharge Diagnoses:  Principal Problem:   Humerus fracture Active Problems:   HLD (hyperlipidemia)   Essential hypertension   CAD, NATIVE VESSEL   GERD   History of TIA (transient ischemic attack)   Hypothyroidism   Benign prostatic hyperplasia with lower urinary tract symptoms  Mechanical fall leading to a comminuted right femoral fracture: Orthopedic surgery was consulted recommended nonoperative management remains in sling except for bathing motion as tolerated. Will need to follow-up within 14 days to repeat x-ray. Physical therapy evaluated the patient, he will be going to skilled nursing facility. Norco was controlling his pain he was started on MiraLAX and has had a bowel movement. No events overnight.  Essential hypertension: No changes made to his medication.  Hyperlipidemia: Resume Plavix and statins.  Chronic kidney  stage IIIb: His creatinine is at baseline.  BPH: Continue Flomax.  GERD: Continue PPI.  Hypothyroidism: Continue Synthroid.  Insomnia: Continue melatonin.  Mild dementia: Noted.  Discharge Instructions  Discharge Instructions     Diet - low sodium heart healthy   Complete by: As directed    Diet - low sodium heart healthy   Complete by: As directed    Diet - low sodium heart healthy   Complete by: As directed    Increase activity slowly   Complete by: As directed    Increase activity slowly   Complete by: As directed    Increase activity slowly   Complete by: As directed       Allergies as of 02/06/2021       Reactions   Epinephrine Other (See Comments)   WITH NOVOCAIN = CHEST PAIN   Procaine Other (See Comments)   WITH EPINEPHRINE CHEST PAIN Other reaction(s): Other WITH EPINEPHRINE CHEST PAIN   Aricept [donepezil]    Other reaction(s): Unknown   Levonordefrin [nordefrin]    Other reaction(s): Unknown   Mepivacaine    Other reaction(s): Unknown   Other Hives, Other (See Comments)   Isocaine = "Mepivacaine"        Medication List     TAKE these medications    cholecalciferol 1000 units tablet Commonly known as: VITAMIN D Take 5,000 Units by mouth daily. Reported on 07/01/2015   clopidogrel 75 MG tablet Commonly known as: PLAVIX Take 75 mg by mouth at bedtime. Patient not taking since 02/02/2016   HYDROcodone-acetaminophen 5-325 MG tablet Commonly known as: NORCO/VICODIN Take 1 tablet by mouth every 6 (six) hours as needed for up to 5 days for moderate pain.   levothyroxine 50 MCG tablet Commonly known as: SYNTHROID Take 50 mcg by  mouth daily before breakfast.   Melatonin 5 MG Caps Take 10 mg by mouth at bedtime as needed (sleep). Reported on 07/01/2015   omeprazole 20 MG capsule Commonly known as: PRILOSEC Take 20 mg by mouth every other day.   simvastatin 20 MG tablet Commonly known as: ZOCOR TAKE 1 TABLET(20 MG) BY MOUTH  DAILY What changed: See the new instructions.   tamsulosin 0.4 MG Caps capsule Commonly known as: FLOMAX Take 0.4 mg by mouth daily.        Contact information for follow-up providers     Tania Ade, MD Follow up in 2 week(s).   Specialty: Orthopedic Surgery Contact information: [address] [place] [address] [phone]              Contact information for after-discharge care     Destination     [facility] .   Service: Skilled Nursing Contact information: [address] [address] [phone]                    Allergies  Allergen Reactions   Epinephrine Other (See Comments)    WITH NOVOCAIN = CHEST PAIN   Procaine Other (See Comments)    WITH EPINEPHRINE CHEST PAIN Other reaction(s): Other WITH EPINEPHRINE CHEST PAIN   Aricept [Donepezil]     Other reaction(s): Unknown   Levonordefrin [Nordefrin]     Other reaction(s): Unknown   Mepivacaine     Other reaction(s): Unknown   Other Hives and Other (See Comments)    Isocaine = "Mepivacaine"    Consultations: Orthopedic surgery   Procedures/Studies: DG Chest 2 View  Result Date: 01/31/2021 CLINICAL DATA:  Post fall with chest and left arm pain. EXAM: CHEST - 2 VIEW COMPARISON:  02/05/2016 FINDINGS: Upper normal heart size. Aortic atherosclerosis and tortuosity. Vascular congestion versus bronchovascular crowding. Left proximal humerus fracture, better assessed on concurrent shoulder exam. No pneumothorax or pleural effusion. No visualized rib fracture. IMPRESSION: 1. Mild vascular congestion versus bronchovascular crowding. 2. Left proximal humerus fracture, better assessed on concurrent shoulder exam. Electronically Signed   By: Keith Rake M.D.   On: 01/31/2021 21:05   CT HEAD WO CONTRAST (5MM)  Result Date: 01/31/2021 CLINICAL DATA:  Trauma. EXAM: CT HEAD WITHOUT CONTRAST CT CERVICAL SPINE WITHOUT CONTRAST  TECHNIQUE: Multidetector CT imaging of the head and cervical spine was performed following the standard protocol without intravenous contrast. Multiplanar CT image reconstructions of the cervical spine were also generated. RADIATION DOSE REDUCTION: This exam was performed according to the departmental dose-optimization program which includes automated exposure control, adjustment of the mA and/or kV according to patient size and/or use of iterative reconstruction technique. COMPARISON:  MRI brain 04/20/2020. FINDINGS: CT HEAD FINDINGS Brain: No evidence of acute infarction, hemorrhage, hydrocephalus, extra-axial collection or mass lesion/mass effect. There is stable mild diffuse atrophy with compensatory dilatation of the ventricular system. There is stable mild periventricular white matter hypodensity, likely chronic small vessel ischemic change. Vascular: Atherosclerotic calcifications are present within the cavernous internal carotid arteries. Skull: Normal. Negative for fracture or focal lesion. Sinuses/Orbits: There is mucosal thickening of the ethmoid air cells, right frontal sinus and bilateral maxillary sinuses. Mastoid air cells are clear. Orbits are grossly within normal limits. Other: None. CT CERVICAL SPINE FINDINGS Alignment: Normal. Skull base and vertebrae: No acute fracture. No primary bone lesion or focal pathologic process. Bones are diffusely osteopenic. Soft tissues and spinal canal: No prevertebral fluid or swelling.  No visible canal hematoma. Disc levels: There is intervertebral disc space narrowing and endplate osteophyte formation throughout the cervical spine compatible with degenerative change. This is most significant at C5-C6 and C6-C7. There is no severe central canal or neural foraminal stenosis at any level. There is some mild to moderate central canal stenosis at C6-C7 secondary to posterior disc osteophyte complex. There is calcified pannus formation at the level of C2. Upper chest:  Negative. Other: None. IMPRESSION: 1.  No acute intracranial process. 2. No acute fracture or traumatic subluxation of the cervical spine. Electronically Signed   By: Ronney Asters M.D.   On: 01/31/2021 21:17   CT Cervical Spine Wo Contrast  Result Date: 01/31/2021 CLINICAL DATA:  Trauma. EXAM: CT HEAD WITHOUT CONTRAST CT CERVICAL SPINE WITHOUT CONTRAST TECHNIQUE: Multidetector CT imaging of the head and cervical spine was performed following the standard protocol without intravenous contrast. Multiplanar CT image reconstructions of the cervical spine were also generated. RADIATION DOSE REDUCTION: This exam was performed according to the departmental dose-optimization program which includes automated exposure control, adjustment of the mA and/or kV according to patient size and/or use of iterative reconstruction technique. COMPARISON:  MRI brain 04/20/2020. FINDINGS: CT HEAD FINDINGS Brain: No evidence of acute infarction, hemorrhage, hydrocephalus, extra-axial collection or mass lesion/mass effect. There is stable mild diffuse atrophy with compensatory dilatation of the ventricular system. There is stable mild periventricular white matter hypodensity, likely chronic small vessel ischemic change. Vascular: Atherosclerotic calcifications are present within the cavernous internal carotid arteries. Skull: Normal. Negative for fracture or focal lesion. Sinuses/Orbits: There is mucosal thickening of the ethmoid air cells, right frontal sinus and bilateral maxillary sinuses. Mastoid air cells are clear. Orbits are grossly within normal limits. Other: None. CT CERVICAL SPINE FINDINGS Alignment: Normal. Skull base and vertebrae: No acute fracture. No primary bone lesion or focal pathologic process. Bones are diffusely osteopenic. Soft tissues and spinal canal: No prevertebral fluid or swelling. No visible canal hematoma. Disc levels: There is intervertebral disc space narrowing and endplate osteophyte formation  throughout the cervical spine compatible with degenerative change. This is most significant at C5-C6 and C6-C7. There is no severe central canal or neural foraminal stenosis at any level. There is some mild to moderate central canal stenosis at C6-C7 secondary to posterior disc osteophyte complex. There is calcified pannus formation at the level of C2. Upper chest: Negative. Other: None. IMPRESSION: 1.  No acute intracranial process. 2. No acute fracture or traumatic subluxation of the cervical spine. Electronically Signed   By: Ronney Asters M.D.   On: 01/31/2021 21:17   DG Shoulder Left  Result Date: 01/31/2021 CLINICAL DATA:  Post fall with left arm pain. EXAM: LEFT SHOULDER - 2+ VIEW COMPARISON:  None. FINDINGS: Comminuted fracture of the proximal humerus. Fracture involves the surgical neck and lateral humeral head. There is displacement of greater than 1 cm. No convincing intra-articular involvement. No humeral head dislocation. Acromioclavicular degenerative change. IMPRESSION: Comminuted and displaced proximal humerus fracture involving the surgical neck and lateral humeral head. Electronically Signed   By: Keith Rake M.D.   On: 01/31/2021 21:06   DG Knee Complete 4 Views Left  Result Date: 01/31/2021 CLINICAL DATA:  Post fall with left knee pain. EXAM: LEFT KNEE - COMPLETE 4+ VIEW COMPARISON:  None. FINDINGS: No fracture or dislocation. Small knee joint effusion. Mild tricompartmental osteoarthritis. There is prominent chondrocalcinosis. Advanced vascular calcifications. IMPRESSION: 1. No fracture or dislocation of the left knee. 2. Mild tricompartmental osteoarthritis and chondrocalcinosis.  3. Small knee joint effusion. Electronically Signed   By: Keith Rake M.D.   On: 01/31/2021 21:07     Subjective: No complaints  Discharge Exam: Vitals:   02/05/21 2015 02/06/21 0427  BP: 122/62 120/73  Pulse: 98 88  Resp: 20 16  Temp: 98.4 F (36.9 C) 98.9 F (37.2 C)  SpO2: 100% 97%    Vitals:   02/05/21 1455 02/05/21 2015 02/06/21 0427 02/06/21 0428  BP: 125/83 122/62 120/73   Pulse: 99 98 88   Resp: 16 20 16    Temp: 98.1 F (36.7 C) 98.4 F (36.9 C) 98.9 F (37.2 C)   TempSrc: Oral Oral Oral   SpO2: 98% 100% 97%   Weight:    103.4 kg  Height:        General: Pt is alert, awake, not in acute distress Cardiovascular: RRR, S1/S2 +, no rubs, no gallops Respiratory: CTA bilaterally, no wheezing, no rhonchi Abdominal: Soft, NT, ND, bowel sounds + Extremities: no edema, no cyanosis    The results of significant diagnostics from this hospitalization (including imaging, microbiology, ancillary and laboratory) are listed below for reference.     Microbiology: Recent Results (from the past 240 hour(s))  Resp Panel by RT-PCR (Flu A&B, Covid) Nasopharyngeal Swab     Status: None   Collection Time: 02/01/21  1:00 AM   Specimen: Nasopharyngeal Swab; Nasopharyngeal(NP) swabs in vial transport medium  Result Value Ref Range Status   SARS Coronavirus 2 by RT PCR NEGATIVE NEGATIVE Final    Comment: (NOTE) SARS-CoV-2 target nucleic acids are NOT DETECTED.  The SARS-CoV-2 RNA is generally detectable in upper respiratory specimens during the acute phase of infection. The lowest concentration of SARS-CoV-2 viral copies this assay can detect is 138 copies/mL. A negative result does not preclude SARS-Cov-2 infection and should not be used as the sole basis for treatment or other patient management decisions. A negative result may occur with  improper specimen collection/handling, submission of specimen other than nasopharyngeal swab, presence of viral mutation(s) within the areas targeted by this assay, and inadequate number of viral copies(<138 copies/mL). A negative result must be combined with clinical observations, patient history, and epidemiological information. The expected result is Negative.  Fact Sheet for Patients:   EntrepreneurPulse.com.au  Fact Sheet for Healthcare Providers:  IncredibleEmployment.be  This test is no t yet approved or cleared by the [place] FDA and  has been authorized for detection and/or diagnosis of SARS-CoV-2 by FDA under an Emergency Use Authorization (EUA). This EUA will remain  in effect (meaning this test can be used) for the duration of the COVID-19 declaration under Section 564(b)(1) of the Act, 21 U.S.C.section 360bbb-3(b)(1), unless the authorization is terminated  or revoked sooner.       Influenza A by PCR NEGATIVE NEGATIVE Final   Influenza B by PCR NEGATIVE NEGATIVE Final    Comment: (NOTE) The Xpert Xpress SARS-CoV-2/FLU/RSV plus assay is intended as an aid in the diagnosis of influenza from Nasopharyngeal swab specimens and should not be used as a sole basis for treatment. Nasal washings and aspirates are unacceptable for Xpert Xpress SARS-CoV-2/FLU/RSV testing.  Fact Sheet for Patients: EntrepreneurPulse.com.au  Fact Sheet for Healthcare Providers: IncredibleEmployment.be  This test is not yet approved or cleared by the [place] FDA and has been authorized for detection and/or diagnosis of SARS-CoV-2 by FDA under an Emergency Use Authorization (EUA). This EUA will remain in effect (meaning this test can be used) for the duration of the COVID-19  declaration under Section 564(b)(1) of the Act, 21 U.S.C. section 360bbb-3(b)(1), unless the authorization is terminated or revoked.  Performed at [facility], [address] [place], [place] [address]   Resp Panel by RT-PCR (Flu A&B, Covid) Nasopharyngeal Swab     Status: None   Collection Time: 02/02/21 12:24 PM   Specimen: Nasopharyngeal Swab; Nasopharyngeal(NP) swabs in vial transport medium  Result Value Ref Range Status   SARS Coronavirus 2 by RT PCR NEGATIVE NEGATIVE Final    Comment:  (NOTE) SARS-CoV-2 target nucleic acids are NOT DETECTED.  The SARS-CoV-2 RNA is generally detectable in upper respiratory specimens during the acute phase of infection. The lowest concentration of SARS-CoV-2 viral copies this assay can detect is 138 copies/mL. A negative result does not preclude SARS-Cov-2 infection and should not be used as the sole basis for treatment or other patient management decisions. A negative result may occur with  improper specimen collection/handling, submission of specimen other than nasopharyngeal swab, presence of viral mutation(s) within the areas targeted by this assay, and inadequate number of viral copies(<138 copies/mL). A negative result must be combined with clinical observations, patient history, and epidemiological information. The expected result is Negative.  Fact Sheet for Patients:  BloggerCourse.comhttps://www.fda.gov/media/152166/download  Fact Sheet for Healthcare Providers:  SeriousBroker.ithttps://www.fda.gov/media/152162/download  This test is no t yet approved or cleared by the [place] FDA and  has been authorized for detection and/or diagnosis of SARS-CoV-2 by FDA under an Emergency Use Authorization (EUA). This EUA will remain  in effect (meaning this test can be used) for the duration of the COVID-19 declaration under Section 564(b)(1) of the Act, 21 U.S.C.section 360bbb-3(b)(1), unless the authorization is terminated  or revoked sooner.       Influenza A by PCR NEGATIVE NEGATIVE Final   Influenza B by PCR NEGATIVE NEGATIVE Final    Comment: (NOTE) The Xpert Xpress SARS-CoV-2/FLU/RSV plus assay is intended as an aid in the diagnosis of influenza from Nasopharyngeal swab specimens and should not be used as a sole basis for treatment. Nasal washings and aspirates are unacceptable for Xpert Xpress SARS-CoV-2/FLU/RSV testing.  Fact Sheet for Patients: BloggerCourse.comhttps://www.fda.gov/media/152166/download  Fact Sheet for Healthcare  Providers: SeriousBroker.ithttps://www.fda.gov/media/152162/download  This test is not yet approved or cleared by the [place] FDA and has been authorized for detection and/or diagnosis of SARS-CoV-2 by FDA under an Emergency Use Authorization (EUA). This EUA will remain in effect (meaning this test can be used) for the duration of the COVID-19 declaration under Section 564(b)(1) of the Act, 21 U.S.C. section 360bbb-3(b)(1), unless the authorization is terminated or revoked.  Performed at [facility], [address] [place], [place] [address]      Labs: BNP (last 3 results) No results for input(s): BNP in the last 8760 hours. Basic Metabolic Panel: Recent Labs  Lab 01/31/21 2158 02/01/21 0551  NA 137 137  K 4.5 4.5  CL 106 104  CO2 26 25  GLUCOSE 181* 173*  BUN 28* 31*  CREATININE 1.46* 1.44*  CALCIUM 8.8* 8.8*   Liver Function Tests: Recent Labs  Lab 01/31/21 2158 02/01/21 0551  AST 26 30  ALT 18 19  ALKPHOS 96 88  BILITOT 1.2 1.6*  PROT 6.5 6.4*  ALBUMIN 3.9 3.7   No results for input(s): LIPASE, AMYLASE in the last 168 hours. No results for input(s): AMMONIA in the last 168 hours. CBC: Recent Labs  Lab 01/31/21 2158 02/01/21 0551  WBC 10.6* 11.0*  NEUTROABS 8.6*  --  HGB 13.1 12.8*  HCT 38.8* 37.1*  MCV 103.5* 103.9*  PLT 151 146*   Cardiac Enzymes: Recent Labs  Lab 01/31/21 2158  CKTOTAL 133   BNP: Invalid input(s): POCBNP CBG: No results for input(s): GLUCAP in the last 168 hours. D-Dimer No results for input(s): DDIMER in the last 72 hours. Hgb A1c No results for input(s): HGBA1C in the last 72 hours. Lipid Profile No results for input(s): CHOL, HDL, LDLCALC, TRIG, CHOLHDL, LDLDIRECT in the last 72 hours. Thyroid function studies No results for input(s): TSH, T4TOTAL, T3FREE, THYROIDAB in the last 72 hours.  Invalid input(s): FREET3  Anemia work up No results for input(s): VITAMINB12, FOLATE, FERRITIN, TIBC, IRON,  RETICCTPCT in the last 72 hours. Urinalysis    Component Value Date/Time   COLORURINE YELLOW 03/20/2020 1734   APPEARANCEUR HAZY (A) 03/20/2020 1734   LABSPEC 1.017 03/20/2020 1734   PHURINE 5.0 03/20/2020 1734   GLUCOSEU NEGATIVE 03/20/2020 1734   HGBUR NEGATIVE 03/20/2020 1734   BILIRUBINUR NEGATIVE 03/20/2020 1734   KETONESUR NEGATIVE 03/20/2020 1734   PROTEINUR NEGATIVE 03/20/2020 1734   UROBILINOGEN 0.2 06/17/2010 1608   NITRITE NEGATIVE 03/20/2020 1734   LEUKOCYTESUR NEGATIVE 03/20/2020 1734   Sepsis Labs Invalid input(s): PROCALCITONIN,  WBC,  LACTICIDVEN Microbiology Recent Results (from the past 240 hour(s))  Resp Panel by RT-PCR (Flu A&B, Covid) Nasopharyngeal Swab     Status: None   Collection Time: 02/01/21  1:00 AM   Specimen: Nasopharyngeal Swab; Nasopharyngeal(NP) swabs in vial transport medium  Result Value Ref Range Status   SARS Coronavirus 2 by RT PCR NEGATIVE NEGATIVE Final    Comment: (NOTE) SARS-CoV-2 target nucleic acids are NOT DETECTED.  The SARS-CoV-2 RNA is generally detectable in upper respiratory specimens during the acute phase of infection. The lowest concentration of SARS-CoV-2 viral copies this assay can detect is 138 copies/mL. A negative result does not preclude SARS-Cov-2 infection and should not be used as the sole basis for treatment or other patient management decisions. A negative result may occur with  improper specimen collection/handling, submission of specimen other than nasopharyngeal swab, presence of viral mutation(s) within the areas targeted by this assay, and inadequate number of viral copies(<138 copies/mL). A negative result must be combined with clinical observations, patient history, and epidemiological information. The expected result is Negative.  Fact Sheet for Patients:  EntrepreneurPulse.com.au  Fact Sheet for Healthcare Providers:  IncredibleEmployment.be  This test is no t  yet approved or cleared by the [place] FDA and  has been authorized for detection and/or diagnosis of SARS-CoV-2 by FDA under an Emergency Use Authorization (EUA). This EUA will remain  in effect (meaning this test can be used) for the duration of the COVID-19 declaration under Section 564(b)(1) of the Act, 21 U.S.C.section 360bbb-3(b)(1), unless the authorization is terminated  or revoked sooner.       Influenza A by PCR NEGATIVE NEGATIVE Final   Influenza B by PCR NEGATIVE NEGATIVE Final    Comment: (NOTE) The Xpert Xpress SARS-CoV-2/FLU/RSV plus assay is intended as an aid in the diagnosis of influenza from Nasopharyngeal swab specimens and should not be used as a sole basis for treatment. Nasal washings and aspirates are unacceptable for Xpert Xpress SARS-CoV-2/FLU/RSV testing.  Fact Sheet for Patients: EntrepreneurPulse.com.au  Fact Sheet for Healthcare Providers: IncredibleEmployment.be  This test is not yet approved or cleared by the [place] FDA and has been authorized for detection and/or diagnosis of SARS-CoV-2 by FDA under an Emergency Use Authorization (EUA). This  EUA will remain in effect (meaning this test can be used) for the duration of the COVID-19 declaration under Section 564(b)(1) of the Act, 21 U.S.C. section 360bbb-3(b)(1), unless the authorization is terminated or revoked.  Performed at [facility], [place] [address] [place], [place] [address]   Resp Panel by RT-PCR (Flu A&B, Covid) Nasopharyngeal Swab     Status: None   Collection Time: 02/02/21 12:24 PM   Specimen: Nasopharyngeal Swab; Nasopharyngeal(NP) swabs in vial transport medium  Result Value Ref Range Status   SARS Coronavirus 2 by RT PCR NEGATIVE NEGATIVE Final    Comment: (NOTE) SARS-CoV-2 target nucleic acids are NOT DETECTED.  The SARS-CoV-2 RNA is generally detectable in upper respiratory specimens during the acute phase  of infection. The lowest concentration of SARS-CoV-2 viral copies this assay can detect is 138 copies/mL. A negative result does not preclude SARS-Cov-2 infection and should not be used as the sole basis for treatment or other patient management decisions. A negative result may occur with  improper specimen collection/handling, submission of specimen other than nasopharyngeal swab, presence of viral mutation(s) within the areas targeted by this assay, and inadequate number of viral copies(<138 copies/mL). A negative result must be combined with clinical observations, patient history, and epidemiological information. The expected result is Negative.  Fact Sheet for Patients:  EntrepreneurPulse.com.au  Fact Sheet for Healthcare Providers:  IncredibleEmployment.be  This test is no t yet approved or cleared by the [place] FDA and  has been authorized for detection and/or diagnosis of SARS-CoV-2 by FDA under an Emergency Use Authorization (EUA). This EUA will remain  in effect (meaning this test can be used) for the duration of the COVID-19 declaration under Section 564(b)(1) of the Act, 21 U.S.C.section 360bbb-3(b)(1), unless the authorization is terminated  or revoked sooner.       Influenza A by PCR NEGATIVE NEGATIVE Final   Influenza B by PCR NEGATIVE NEGATIVE Final    Comment: (NOTE) The Xpert Xpress SARS-CoV-2/FLU/RSV plus assay is intended as an aid in the diagnosis of influenza from Nasopharyngeal swab specimens and should not be used as a sole basis for treatment. Nasal washings and aspirates are unacceptable for Xpert Xpress SARS-CoV-2/FLU/RSV testing.  Fact Sheet for Patients: EntrepreneurPulse.com.au  Fact Sheet for Healthcare Providers: IncredibleEmployment.be  This test is not yet approved or cleared by the [place] FDA and has been authorized for detection and/or diagnosis of  SARS-CoV-2 by FDA under an Emergency Use Authorization (EUA). This EUA will remain in effect (meaning this test can be used) for the duration of the COVID-19 declaration under Section 564(b)(1) of the Act, 21 U.S.C. section 360bbb-3(b)(1), unless the authorization is terminated or revoked.  Performed at [facility], [place] [address] [place], [place] [address]       SIGNED:   Charlynne Cousins, MD  [facility] 02/06/2021, 10:15 AM Pager   If 7PM-7AM, please contact night-coverage [email or web address] Password [email or web address]

## 2021-02-06 NOTE — Progress Notes (Signed)
Occupational Therapy Treatment Patient Details Name: Shawn Sanchez MRN: [id number] DOB: Nov 03, 1931 Today's Date: 02/06/2021   History of present illness Patient is an 88 year old male that fell resulting in a L humerus fracture. PMH includes TIA, radiculopathy status post laminectomy, hypothyroidism, essential hypertension, CAD status post angioplasty, anemia, alcohol abuse   OT comments  Patient pleasant to OT however stating he is frustrated with "being stuck on this floor" patient with history of dementia. Limited carry over of exercises and techniques for upper body dressing needing mod A and total A to don sling correctly. Patient overall min A using HHA to transfer to recliner chair and tolerate standing ~45 seconds while perianal care performed. Did not report dizziness this session.    Recommendations for follow up therapy are one component of a multi-disciplinary discharge planning process, led by the attending physician.  Recommendations may be updated based on patient status, additional functional criteria and insurance authorization.    Follow Up Recommendations  Skilled nursing-short term rehab (<3 hours/day)    Assistance Recommended at Discharge Frequent or constant Supervision/Assistance  Patient can return home with the following  A little help with walking and/or transfers;A lot of help with bathing/dressing/bathroom;Assistance with cooking/housework   Equipment Recommendations  None recommended by OT       Precautions / Restrictions Precautions Precautions: Shoulder Shoulder Interventions: Shoulder sling/immobilizer;At all times Precaution Comments: sling at all times except for bathing and dressing. He can perform gentle ROM of hand, wrist, and elbow Required Braces or Orthoses: Sling Restrictions Weight Bearing Restrictions: Yes LUE Weight Bearing: Non weight bearing       Mobility Bed Mobility Overal bed mobility: Needs Assistance Bed Mobility: Supine to  Sit     Supine to sit: Max assist, HOB elevated     General bed mobility comments: Patient very stiff/rigid in legs and trunk needing max A to slide legs to edge of bed and upright trunk. Difficulty weight shifting needing assist with bed pad to scoot hips forward    Transfers Overall transfer level: Needs assistance Equipment used: 1 person hand held assist Transfers: Sit to/from Stand, Bed to chair/wheelchair/BSC Sit to Stand: Min assist Stand pivot transfers: Min assist         General transfer comment: Min A with HHA as patient is unsteady     Balance Overall balance assessment: Needs assistance, History of Falls Sitting-balance support: Feet supported Sitting balance-Leahy Scale: Fair     Standing balance support: Single extremity supported, During functional activity Standing balance-Leahy Scale: Poor Standing balance comment: reliant on external support for balance                           ADL either performed or assessed with clinical judgement   ADL Overall ADL's : Needs assistance/impaired                 Upper Body Dressing : Moderate assistance;Sitting Upper Body Dressing Details (indicate cue type and reason): Patient with limited carry over for dressing techniques needing mod A to down hospital down L UE     Toilet Transfer: Minimal assistance;Cueing for safety;Cueing for sequencing;Stand-pivot Toilet Transfer Details (indicate cue type and reason): Hand held assistance to walk few steps to recliner chair. Needs verbal cues to sequence turning to sit in chair. Toileting- Clothing Manipulation and Hygiene: Total assistance;Sit to/from stand Toileting - Clothing Manipulation Details (indicate cue type and reason): Able to tolerate ~45 seconds of standing  at chair while OT complete perianal hygiene as his catheter leaked     Functional mobility during ADLs: Minimal assistance (HHA)      Extremity/Trunk Assessment Upper Extremity  Assessment Upper Extremity Assessment: LUE deficits/detail LUE Deficits / Details: Significantly limited elbow flexion to approximately 60 degrees due to pain and edema during exercises             Cognition Arousal/Alertness: Awake/alert Behavior During Therapy: WFL for tasks assessed/performed Overall Cognitive Status: History of cognitive impairments - at baseline                                          Exercises Exercises: General Upper Extremity General Exercises - Upper Extremity Elbow Flexion: AAROM, Left, 10 reps, Seated Elbow Extension: AAROM, Left, 10 reps, Seated Wrist Flexion: AROM, Left, 10 reps, Seated Wrist Extension: AROM, Left, 10 reps, Seated Digit Composite Flexion: AROM, Left, 10 reps, Seated Composite Extension: AROM, Left, 10 reps, Seated            Pertinent Vitals/ Pain       Pain Assessment Pain Assessment: Faces Faces Pain Scale: Hurts little more Pain Location: L shoulder/arm Pain Descriptors / Indicators: Guarding Pain Intervention(s): Limited activity within patient's tolerance         Frequency  Min 2X/week        Progress Toward Goals  OT Goals(current goals can now be found in the care plan section)  Progress towards OT goals: Progressing toward goals  Acute Rehab OT Goals Patient Stated Goal: "get to the first level" OT Goal Formulation: With patient Time For Goal Achievement: 02/15/21 Potential to Achieve Goals: Good ADL Goals Pt Will Perform Upper Body Dressing: with min assist;sitting Pt Will Perform Lower Body Dressing: with min assist;sit to/from stand;sitting/lateral leans Pt Will Transfer to Toilet: with min guard assist;ambulating  Plan Discharge plan remains appropriate       AM-PAC OT "6 Clicks" Daily Activity     Outcome Measure   Help from another person eating meals?: A Little Help from another person taking care of personal grooming?: A Little Help from another person toileting, which  includes using toliet, bedpan, or urinal?: A Lot Help from another person bathing (including washing, rinsing, drying)?: A Lot Help from another person to put on and taking off regular upper body clothing?: A Lot Help from another person to put on and taking off regular lower body clothing?: A Lot 6 Click Score: 14    End of Session Equipment Utilized During Treatment: Other (comment) (sling)  OT Visit Diagnosis: Unsteadiness on feet (R26.81);Other abnormalities of gait and mobility (R26.89);Repeated falls (R29.6);Pain;Other symptoms and signs involving cognitive function Pain - Right/Left: Left Pain - part of body: Shoulder   Activity Tolerance Patient tolerated treatment well   Patient Left in chair;with call bell/phone within reach;with chair alarm set;with nursing/sitter in room   Nurse Communication Mobility status;Other (comment) (leaking catheter)        Time: 6761-9509 OT Time Calculation (min): 24 min  Charges: OT General Charges $OT Visit: 1 Visit OT Treatments $Self Care/Home Management : 8-22 mins $Therapeutic Exercise: 8-22 mins  Marlyce Huge OT OT pager: [phone]   Carmelia Roller 02/06/2021, 10:20 AM

## 2021-02-07 DIAGNOSIS — N3946 Mixed incontinence: Secondary | ICD-10-CM | POA: Diagnosis not present

## 2021-02-07 DIAGNOSIS — M6281 Muscle weakness (generalized): Secondary | ICD-10-CM | POA: Diagnosis not present

## 2021-02-07 DIAGNOSIS — L89323 Pressure ulcer of left buttock, stage 3: Secondary | ICD-10-CM | POA: Diagnosis not present

## 2021-02-12 DIAGNOSIS — N400 Enlarged prostate without lower urinary tract symptoms: Secondary | ICD-10-CM | POA: Diagnosis not present

## 2021-02-12 DIAGNOSIS — E785 Hyperlipidemia, unspecified: Secondary | ICD-10-CM | POA: Diagnosis not present

## 2021-02-12 DIAGNOSIS — R296 Repeated falls: Secondary | ICD-10-CM | POA: Diagnosis not present

## 2021-02-12 DIAGNOSIS — E039 Hypothyroidism, unspecified: Secondary | ICD-10-CM | POA: Diagnosis not present

## 2021-02-12 DIAGNOSIS — S42352S Displaced comminuted fracture of shaft of humerus, left arm, sequela: Secondary | ICD-10-CM | POA: Diagnosis not present

## 2021-02-12 DIAGNOSIS — I2510 Atherosclerotic heart disease of native coronary artery without angina pectoris: Secondary | ICD-10-CM | POA: Diagnosis not present

## 2021-02-12 DIAGNOSIS — K219 Gastro-esophageal reflux disease without esophagitis: Secondary | ICD-10-CM | POA: Diagnosis not present

## 2021-02-12 DIAGNOSIS — G4700 Insomnia, unspecified: Secondary | ICD-10-CM | POA: Diagnosis not present

## 2021-02-14 DIAGNOSIS — M6281 Muscle weakness (generalized): Secondary | ICD-10-CM | POA: Diagnosis not present

## 2021-02-14 DIAGNOSIS — L89323 Pressure ulcer of left buttock, stage 3: Secondary | ICD-10-CM | POA: Diagnosis not present

## 2021-02-14 DIAGNOSIS — N3946 Mixed incontinence: Secondary | ICD-10-CM | POA: Diagnosis not present

## 2021-02-19 DIAGNOSIS — K219 Gastro-esophageal reflux disease without esophagitis: Secondary | ICD-10-CM | POA: Diagnosis not present

## 2021-02-19 DIAGNOSIS — S42352S Displaced comminuted fracture of shaft of humerus, left arm, sequela: Secondary | ICD-10-CM | POA: Diagnosis not present

## 2021-02-19 DIAGNOSIS — N400 Enlarged prostate without lower urinary tract symptoms: Secondary | ICD-10-CM | POA: Diagnosis not present

## 2021-02-19 DIAGNOSIS — S42302A Unspecified fracture of shaft of humerus, left arm, initial encounter for closed fracture: Secondary | ICD-10-CM | POA: Diagnosis not present

## 2021-02-19 DIAGNOSIS — G4700 Insomnia, unspecified: Secondary | ICD-10-CM | POA: Diagnosis not present

## 2021-02-19 DIAGNOSIS — E785 Hyperlipidemia, unspecified: Secondary | ICD-10-CM | POA: Diagnosis not present

## 2021-02-19 DIAGNOSIS — I2510 Atherosclerotic heart disease of native coronary artery without angina pectoris: Secondary | ICD-10-CM | POA: Diagnosis not present

## 2021-02-19 DIAGNOSIS — E039 Hypothyroidism, unspecified: Secondary | ICD-10-CM | POA: Diagnosis not present

## 2021-02-21 DIAGNOSIS — N3946 Mixed incontinence: Secondary | ICD-10-CM | POA: Diagnosis not present

## 2021-02-21 DIAGNOSIS — L89323 Pressure ulcer of left buttock, stage 3: Secondary | ICD-10-CM | POA: Diagnosis not present

## 2021-02-21 DIAGNOSIS — M6281 Muscle weakness (generalized): Secondary | ICD-10-CM | POA: Diagnosis not present

## 2021-02-22 DIAGNOSIS — E039 Hypothyroidism, unspecified: Secondary | ICD-10-CM | POA: Diagnosis not present

## 2021-02-22 DIAGNOSIS — N400 Enlarged prostate without lower urinary tract symptoms: Secondary | ICD-10-CM | POA: Diagnosis not present

## 2021-02-22 DIAGNOSIS — R296 Repeated falls: Secondary | ICD-10-CM | POA: Diagnosis not present

## 2021-02-22 DIAGNOSIS — S42302A Unspecified fracture of shaft of humerus, left arm, initial encounter for closed fracture: Secondary | ICD-10-CM | POA: Diagnosis not present

## 2021-02-22 DIAGNOSIS — E785 Hyperlipidemia, unspecified: Secondary | ICD-10-CM | POA: Diagnosis not present

## 2021-02-22 DIAGNOSIS — G4700 Insomnia, unspecified: Secondary | ICD-10-CM | POA: Diagnosis not present

## 2021-02-22 DIAGNOSIS — K219 Gastro-esophageal reflux disease without esophagitis: Secondary | ICD-10-CM | POA: Diagnosis not present

## 2021-02-22 DIAGNOSIS — I2510 Atherosclerotic heart disease of native coronary artery without angina pectoris: Secondary | ICD-10-CM | POA: Diagnosis not present

## 2021-02-26 DIAGNOSIS — G4700 Insomnia, unspecified: Secondary | ICD-10-CM | POA: Diagnosis not present

## 2021-02-26 DIAGNOSIS — S31000S Unspecified open wound of lower back and pelvis without penetration into retroperitoneum, sequela: Secondary | ICD-10-CM | POA: Diagnosis not present

## 2021-02-26 DIAGNOSIS — E785 Hyperlipidemia, unspecified: Secondary | ICD-10-CM | POA: Diagnosis not present

## 2021-02-26 DIAGNOSIS — E039 Hypothyroidism, unspecified: Secondary | ICD-10-CM | POA: Diagnosis not present

## 2021-02-26 DIAGNOSIS — S42352S Displaced comminuted fracture of shaft of humerus, left arm, sequela: Secondary | ICD-10-CM | POA: Diagnosis not present

## 2021-02-26 DIAGNOSIS — N400 Enlarged prostate without lower urinary tract symptoms: Secondary | ICD-10-CM | POA: Diagnosis not present

## 2021-02-26 DIAGNOSIS — S42302A Unspecified fracture of shaft of humerus, left arm, initial encounter for closed fracture: Secondary | ICD-10-CM | POA: Diagnosis not present

## 2021-02-26 DIAGNOSIS — I2510 Atherosclerotic heart disease of native coronary artery without angina pectoris: Secondary | ICD-10-CM | POA: Diagnosis not present

## 2021-02-26 DIAGNOSIS — K219 Gastro-esophageal reflux disease without esophagitis: Secondary | ICD-10-CM | POA: Diagnosis not present

## 2021-02-28 DIAGNOSIS — M6281 Muscle weakness (generalized): Secondary | ICD-10-CM | POA: Diagnosis not present

## 2021-02-28 DIAGNOSIS — N3946 Mixed incontinence: Secondary | ICD-10-CM | POA: Diagnosis not present

## 2021-02-28 DIAGNOSIS — L89323 Pressure ulcer of left buttock, stage 3: Secondary | ICD-10-CM | POA: Diagnosis not present

## 2021-03-05 DIAGNOSIS — U071 COVID-19: Secondary | ICD-10-CM | POA: Diagnosis not present

## 2021-03-05 DIAGNOSIS — N400 Enlarged prostate without lower urinary tract symptoms: Secondary | ICD-10-CM | POA: Diagnosis not present

## 2021-03-05 DIAGNOSIS — G4700 Insomnia, unspecified: Secondary | ICD-10-CM | POA: Diagnosis not present

## 2021-03-05 DIAGNOSIS — E039 Hypothyroidism, unspecified: Secondary | ICD-10-CM | POA: Diagnosis not present

## 2021-03-05 DIAGNOSIS — S42352S Displaced comminuted fracture of shaft of humerus, left arm, sequela: Secondary | ICD-10-CM | POA: Diagnosis not present

## 2021-03-05 DIAGNOSIS — E785 Hyperlipidemia, unspecified: Secondary | ICD-10-CM | POA: Diagnosis not present

## 2021-03-05 DIAGNOSIS — E559 Vitamin D deficiency, unspecified: Secondary | ICD-10-CM | POA: Diagnosis not present

## 2021-03-05 DIAGNOSIS — I2510 Atherosclerotic heart disease of native coronary artery without angina pectoris: Secondary | ICD-10-CM | POA: Diagnosis not present

## 2021-03-05 DIAGNOSIS — K219 Gastro-esophageal reflux disease without esophagitis: Secondary | ICD-10-CM | POA: Diagnosis not present

## 2021-03-05 DIAGNOSIS — S31000S Unspecified open wound of lower back and pelvis without penetration into retroperitoneum, sequela: Secondary | ICD-10-CM | POA: Diagnosis not present

## 2021-03-12 DIAGNOSIS — E559 Vitamin D deficiency, unspecified: Secondary | ICD-10-CM | POA: Diagnosis not present

## 2021-03-12 DIAGNOSIS — E785 Hyperlipidemia, unspecified: Secondary | ICD-10-CM | POA: Diagnosis not present

## 2021-03-12 DIAGNOSIS — N400 Enlarged prostate without lower urinary tract symptoms: Secondary | ICD-10-CM | POA: Diagnosis not present

## 2021-03-12 DIAGNOSIS — S42302A Unspecified fracture of shaft of humerus, left arm, initial encounter for closed fracture: Secondary | ICD-10-CM | POA: Diagnosis not present

## 2021-03-12 DIAGNOSIS — I2510 Atherosclerotic heart disease of native coronary artery without angina pectoris: Secondary | ICD-10-CM | POA: Diagnosis not present

## 2021-03-12 DIAGNOSIS — U071 COVID-19: Secondary | ICD-10-CM | POA: Diagnosis not present

## 2021-03-12 DIAGNOSIS — S42352S Displaced comminuted fracture of shaft of humerus, left arm, sequela: Secondary | ICD-10-CM | POA: Diagnosis not present

## 2021-03-12 DIAGNOSIS — K219 Gastro-esophageal reflux disease without esophagitis: Secondary | ICD-10-CM | POA: Diagnosis not present

## 2021-03-12 DIAGNOSIS — E039 Hypothyroidism, unspecified: Secondary | ICD-10-CM | POA: Diagnosis not present

## 2021-03-12 DIAGNOSIS — G4700 Insomnia, unspecified: Secondary | ICD-10-CM | POA: Diagnosis not present

## 2021-03-20 DIAGNOSIS — R2689 Other abnormalities of gait and mobility: Secondary | ICD-10-CM | POA: Diagnosis not present

## 2021-03-20 DIAGNOSIS — L89323 Pressure ulcer of left buttock, stage 3: Secondary | ICD-10-CM | POA: Diagnosis not present

## 2021-03-20 DIAGNOSIS — M6281 Muscle weakness (generalized): Secondary | ICD-10-CM | POA: Diagnosis not present

## 2021-03-20 DIAGNOSIS — N3946 Mixed incontinence: Secondary | ICD-10-CM | POA: Diagnosis not present

## 2021-03-22 DIAGNOSIS — E039 Hypothyroidism, unspecified: Secondary | ICD-10-CM | POA: Diagnosis not present

## 2021-03-22 DIAGNOSIS — E785 Hyperlipidemia, unspecified: Secondary | ICD-10-CM | POA: Diagnosis not present

## 2021-03-22 DIAGNOSIS — K219 Gastro-esophageal reflux disease without esophagitis: Secondary | ICD-10-CM | POA: Diagnosis not present

## 2021-03-22 DIAGNOSIS — E559 Vitamin D deficiency, unspecified: Secondary | ICD-10-CM | POA: Diagnosis not present

## 2021-03-22 DIAGNOSIS — G4700 Insomnia, unspecified: Secondary | ICD-10-CM | POA: Diagnosis not present

## 2021-03-22 DIAGNOSIS — U071 COVID-19: Secondary | ICD-10-CM | POA: Diagnosis not present

## 2021-03-22 DIAGNOSIS — I2510 Atherosclerotic heart disease of native coronary artery without angina pectoris: Secondary | ICD-10-CM | POA: Diagnosis not present

## 2021-03-22 DIAGNOSIS — N400 Enlarged prostate without lower urinary tract symptoms: Secondary | ICD-10-CM | POA: Diagnosis not present

## 2021-03-27 DIAGNOSIS — N3946 Mixed incontinence: Secondary | ICD-10-CM | POA: Diagnosis not present

## 2021-03-27 DIAGNOSIS — M6281 Muscle weakness (generalized): Secondary | ICD-10-CM | POA: Diagnosis not present

## 2021-03-27 DIAGNOSIS — L89323 Pressure ulcer of left buttock, stage 3: Secondary | ICD-10-CM | POA: Diagnosis not present

## 2021-03-27 DIAGNOSIS — L89312 Pressure ulcer of right buttock, stage 2: Secondary | ICD-10-CM | POA: Diagnosis not present

## 2021-03-29 DIAGNOSIS — M1611 Unilateral primary osteoarthritis, right hip: Secondary | ICD-10-CM | POA: Diagnosis not present

## 2021-03-29 DIAGNOSIS — Z23 Encounter for immunization: Secondary | ICD-10-CM | POA: Diagnosis not present

## 2021-04-04 DIAGNOSIS — L89312 Pressure ulcer of right buttock, stage 2: Secondary | ICD-10-CM | POA: Diagnosis not present

## 2021-04-04 DIAGNOSIS — N3946 Mixed incontinence: Secondary | ICD-10-CM | POA: Diagnosis not present

## 2021-04-04 DIAGNOSIS — L89323 Pressure ulcer of left buttock, stage 3: Secondary | ICD-10-CM | POA: Diagnosis not present

## 2021-04-04 DIAGNOSIS — M6281 Muscle weakness (generalized): Secondary | ICD-10-CM | POA: Diagnosis not present

## 2021-04-11 DIAGNOSIS — N3946 Mixed incontinence: Secondary | ICD-10-CM | POA: Diagnosis not present

## 2021-04-11 DIAGNOSIS — L89312 Pressure ulcer of right buttock, stage 2: Secondary | ICD-10-CM | POA: Diagnosis not present

## 2021-04-11 DIAGNOSIS — M6281 Muscle weakness (generalized): Secondary | ICD-10-CM | POA: Diagnosis not present

## 2021-04-11 DIAGNOSIS — L89323 Pressure ulcer of left buttock, stage 3: Secondary | ICD-10-CM | POA: Diagnosis not present

## 2021-04-16 DIAGNOSIS — G4700 Insomnia, unspecified: Secondary | ICD-10-CM | POA: Diagnosis not present

## 2021-04-16 DIAGNOSIS — E785 Hyperlipidemia, unspecified: Secondary | ICD-10-CM | POA: Diagnosis not present

## 2021-04-16 DIAGNOSIS — E559 Vitamin D deficiency, unspecified: Secondary | ICD-10-CM | POA: Diagnosis not present

## 2021-04-16 DIAGNOSIS — I2510 Atherosclerotic heart disease of native coronary artery without angina pectoris: Secondary | ICD-10-CM | POA: Diagnosis not present

## 2021-04-16 DIAGNOSIS — E039 Hypothyroidism, unspecified: Secondary | ICD-10-CM | POA: Diagnosis not present

## 2021-04-16 DIAGNOSIS — R197 Diarrhea, unspecified: Secondary | ICD-10-CM | POA: Diagnosis not present

## 2021-04-16 DIAGNOSIS — N400 Enlarged prostate without lower urinary tract symptoms: Secondary | ICD-10-CM | POA: Diagnosis not present

## 2021-04-16 DIAGNOSIS — K219 Gastro-esophageal reflux disease without esophagitis: Secondary | ICD-10-CM | POA: Diagnosis not present

## 2021-04-19 DIAGNOSIS — G4700 Insomnia, unspecified: Secondary | ICD-10-CM | POA: Diagnosis not present

## 2021-04-19 DIAGNOSIS — I2510 Atherosclerotic heart disease of native coronary artery without angina pectoris: Secondary | ICD-10-CM | POA: Diagnosis not present

## 2021-04-19 DIAGNOSIS — E785 Hyperlipidemia, unspecified: Secondary | ICD-10-CM | POA: Diagnosis not present

## 2021-04-19 DIAGNOSIS — N400 Enlarged prostate without lower urinary tract symptoms: Secondary | ICD-10-CM | POA: Diagnosis not present

## 2021-04-19 DIAGNOSIS — K219 Gastro-esophageal reflux disease without esophagitis: Secondary | ICD-10-CM | POA: Diagnosis not present

## 2021-04-19 DIAGNOSIS — R197 Diarrhea, unspecified: Secondary | ICD-10-CM | POA: Diagnosis not present

## 2021-04-19 DIAGNOSIS — E039 Hypothyroidism, unspecified: Secondary | ICD-10-CM | POA: Diagnosis not present

## 2021-05-01 DIAGNOSIS — F0390 Unspecified dementia without behavioral disturbance: Secondary | ICD-10-CM | POA: Diagnosis not present

## 2021-05-01 DIAGNOSIS — R531 Weakness: Secondary | ICD-10-CM | POA: Diagnosis not present

## 2021-05-01 DIAGNOSIS — E039 Hypothyroidism, unspecified: Secondary | ICD-10-CM | POA: Diagnosis not present

## 2021-05-02 DIAGNOSIS — F0390 Unspecified dementia without behavioral disturbance: Secondary | ICD-10-CM | POA: Diagnosis not present

## 2021-05-02 DIAGNOSIS — Z7689 Persons encountering health services in other specified circumstances: Secondary | ICD-10-CM | POA: Diagnosis not present

## 2021-05-02 DIAGNOSIS — D649 Anemia, unspecified: Secondary | ICD-10-CM | POA: Diagnosis not present

## 2021-05-02 DIAGNOSIS — E785 Hyperlipidemia, unspecified: Secondary | ICD-10-CM | POA: Diagnosis not present

## 2021-05-03 DIAGNOSIS — F0390 Unspecified dementia without behavioral disturbance: Secondary | ICD-10-CM | POA: Diagnosis not present

## 2021-05-04 DIAGNOSIS — F0390 Unspecified dementia without behavioral disturbance: Secondary | ICD-10-CM | POA: Diagnosis not present

## 2021-05-07 DIAGNOSIS — F0390 Unspecified dementia without behavioral disturbance: Secondary | ICD-10-CM | POA: Diagnosis not present

## 2021-05-08 DIAGNOSIS — F0390 Unspecified dementia without behavioral disturbance: Secondary | ICD-10-CM | POA: Diagnosis not present

## 2021-05-09 DIAGNOSIS — F0390 Unspecified dementia without behavioral disturbance: Secondary | ICD-10-CM | POA: Diagnosis not present

## 2021-05-10 DIAGNOSIS — N400 Enlarged prostate without lower urinary tract symptoms: Secondary | ICD-10-CM | POA: Diagnosis not present

## 2021-05-10 DIAGNOSIS — E785 Hyperlipidemia, unspecified: Secondary | ICD-10-CM | POA: Diagnosis not present

## 2021-05-10 DIAGNOSIS — E039 Hypothyroidism, unspecified: Secondary | ICD-10-CM | POA: Diagnosis not present

## 2021-05-10 DIAGNOSIS — F0390 Unspecified dementia without behavioral disturbance: Secondary | ICD-10-CM | POA: Diagnosis not present

## 2021-05-10 DIAGNOSIS — I2510 Atherosclerotic heart disease of native coronary artery without angina pectoris: Secondary | ICD-10-CM | POA: Diagnosis not present

## 2021-05-10 DIAGNOSIS — E559 Vitamin D deficiency, unspecified: Secondary | ICD-10-CM | POA: Diagnosis not present

## 2021-05-11 DIAGNOSIS — F0390 Unspecified dementia without behavioral disturbance: Secondary | ICD-10-CM | POA: Diagnosis not present

## 2021-05-14 DIAGNOSIS — F0390 Unspecified dementia without behavioral disturbance: Secondary | ICD-10-CM | POA: Diagnosis not present

## 2021-06-26 DIAGNOSIS — R52 Pain, unspecified: Secondary | ICD-10-CM | POA: Diagnosis not present

## 2021-06-26 DIAGNOSIS — E785 Hyperlipidemia, unspecified: Secondary | ICD-10-CM | POA: Diagnosis not present

## 2021-06-26 DIAGNOSIS — F331 Major depressive disorder, recurrent, moderate: Secondary | ICD-10-CM | POA: Diagnosis not present

## 2021-06-26 DIAGNOSIS — F0390 Unspecified dementia without behavioral disturbance: Secondary | ICD-10-CM | POA: Diagnosis not present

## 2021-06-26 DIAGNOSIS — N400 Enlarged prostate without lower urinary tract symptoms: Secondary | ICD-10-CM | POA: Diagnosis not present

## 2021-06-26 DIAGNOSIS — Z6821 Body mass index (BMI) 21.0-21.9, adult: Secondary | ICD-10-CM | POA: Diagnosis not present

## 2021-06-26 DIAGNOSIS — E559 Vitamin D deficiency, unspecified: Secondary | ICD-10-CM | POA: Diagnosis not present

## 2021-06-26 DIAGNOSIS — Z8673 Personal history of transient ischemic attack (TIA), and cerebral infarction without residual deficits: Secondary | ICD-10-CM | POA: Diagnosis not present

## 2021-06-26 DIAGNOSIS — E039 Hypothyroidism, unspecified: Secondary | ICD-10-CM | POA: Diagnosis not present

## 2021-06-26 DIAGNOSIS — G4700 Insomnia, unspecified: Secondary | ICD-10-CM | POA: Diagnosis not present

## 2021-06-28 DIAGNOSIS — D649 Anemia, unspecified: Secondary | ICD-10-CM | POA: Diagnosis not present

## 2021-06-28 DIAGNOSIS — N1832 Chronic kidney disease, stage 3b: Secondary | ICD-10-CM | POA: Diagnosis not present

## 2021-06-28 DIAGNOSIS — E860 Dehydration: Secondary | ICD-10-CM | POA: Diagnosis not present

## 2021-08-15 DIAGNOSIS — R0782 Intercostal pain: Secondary | ICD-10-CM | POA: Diagnosis not present

## 2021-08-16 DIAGNOSIS — R52 Pain, unspecified: Secondary | ICD-10-CM | POA: Diagnosis not present

## 2021-08-16 DIAGNOSIS — M47816 Spondylosis without myelopathy or radiculopathy, lumbar region: Secondary | ICD-10-CM | POA: Diagnosis not present

## 2021-08-16 DIAGNOSIS — R2689 Other abnormalities of gait and mobility: Secondary | ICD-10-CM | POA: Diagnosis not present

## 2021-08-16 DIAGNOSIS — M6281 Muscle weakness (generalized): Secondary | ICD-10-CM | POA: Diagnosis not present

## 2021-08-16 DIAGNOSIS — Z9181 History of falling: Secondary | ICD-10-CM | POA: Diagnosis not present

## 2021-08-16 DIAGNOSIS — W19XXXA Unspecified fall, initial encounter: Secondary | ICD-10-CM | POA: Diagnosis not present

## 2021-08-17 DIAGNOSIS — Z7689 Persons encountering health services in other specified circumstances: Secondary | ICD-10-CM | POA: Diagnosis not present

## 2021-08-17 DIAGNOSIS — R2689 Other abnormalities of gait and mobility: Secondary | ICD-10-CM | POA: Diagnosis not present

## 2021-08-17 DIAGNOSIS — M6281 Muscle weakness (generalized): Secondary | ICD-10-CM | POA: Diagnosis not present

## 2021-08-17 DIAGNOSIS — M545 Low back pain, unspecified: Secondary | ICD-10-CM | POA: Diagnosis not present

## 2021-08-17 DIAGNOSIS — Z9181 History of falling: Secondary | ICD-10-CM | POA: Diagnosis not present

## 2021-08-21 DIAGNOSIS — R2689 Other abnormalities of gait and mobility: Secondary | ICD-10-CM | POA: Diagnosis not present

## 2021-08-21 DIAGNOSIS — M6281 Muscle weakness (generalized): Secondary | ICD-10-CM | POA: Diagnosis not present

## 2021-08-22 DIAGNOSIS — R2689 Other abnormalities of gait and mobility: Secondary | ICD-10-CM | POA: Diagnosis not present

## 2021-08-22 DIAGNOSIS — M6281 Muscle weakness (generalized): Secondary | ICD-10-CM | POA: Diagnosis not present

## 2021-08-23 DIAGNOSIS — R2689 Other abnormalities of gait and mobility: Secondary | ICD-10-CM | POA: Diagnosis not present

## 2021-08-23 DIAGNOSIS — M6281 Muscle weakness (generalized): Secondary | ICD-10-CM | POA: Diagnosis not present

## 2021-08-24 DIAGNOSIS — M6281 Muscle weakness (generalized): Secondary | ICD-10-CM | POA: Diagnosis not present

## 2021-08-24 DIAGNOSIS — R2689 Other abnormalities of gait and mobility: Secondary | ICD-10-CM | POA: Diagnosis not present

## 2021-08-25 DIAGNOSIS — M6281 Muscle weakness (generalized): Secondary | ICD-10-CM | POA: Diagnosis not present

## 2021-08-25 DIAGNOSIS — R2689 Other abnormalities of gait and mobility: Secondary | ICD-10-CM | POA: Diagnosis not present

## 2021-08-26 DIAGNOSIS — M6281 Muscle weakness (generalized): Secondary | ICD-10-CM | POA: Diagnosis not present

## 2021-08-26 DIAGNOSIS — R2689 Other abnormalities of gait and mobility: Secondary | ICD-10-CM | POA: Diagnosis not present

## 2021-08-28 DIAGNOSIS — R2689 Other abnormalities of gait and mobility: Secondary | ICD-10-CM | POA: Diagnosis not present

## 2021-08-28 DIAGNOSIS — M6281 Muscle weakness (generalized): Secondary | ICD-10-CM | POA: Diagnosis not present

## 2021-08-29 DIAGNOSIS — Z7689 Persons encountering health services in other specified circumstances: Secondary | ICD-10-CM | POA: Diagnosis not present

## 2021-08-29 DIAGNOSIS — M6281 Muscle weakness (generalized): Secondary | ICD-10-CM | POA: Diagnosis not present

## 2021-08-29 DIAGNOSIS — Z87440 Personal history of urinary (tract) infections: Secondary | ICD-10-CM | POA: Diagnosis not present

## 2021-08-29 DIAGNOSIS — R2689 Other abnormalities of gait and mobility: Secondary | ICD-10-CM | POA: Diagnosis not present

## 2021-08-29 DIAGNOSIS — N400 Enlarged prostate without lower urinary tract symptoms: Secondary | ICD-10-CM | POA: Diagnosis not present

## 2021-08-29 DIAGNOSIS — F0390 Unspecified dementia without behavioral disturbance: Secondary | ICD-10-CM | POA: Diagnosis not present

## 2021-08-30 DIAGNOSIS — R2689 Other abnormalities of gait and mobility: Secondary | ICD-10-CM | POA: Diagnosis not present

## 2021-08-30 DIAGNOSIS — M6281 Muscle weakness (generalized): Secondary | ICD-10-CM | POA: Diagnosis not present

## 2021-08-31 DIAGNOSIS — R2689 Other abnormalities of gait and mobility: Secondary | ICD-10-CM | POA: Diagnosis not present

## 2021-08-31 DIAGNOSIS — M6281 Muscle weakness (generalized): Secondary | ICD-10-CM | POA: Diagnosis not present

## 2021-09-01 DIAGNOSIS — M6281 Muscle weakness (generalized): Secondary | ICD-10-CM | POA: Diagnosis not present

## 2021-09-01 DIAGNOSIS — R2689 Other abnormalities of gait and mobility: Secondary | ICD-10-CM | POA: Diagnosis not present

## 2021-09-03 DIAGNOSIS — M6281 Muscle weakness (generalized): Secondary | ICD-10-CM | POA: Diagnosis not present

## 2021-09-03 DIAGNOSIS — Z9181 History of falling: Secondary | ICD-10-CM | POA: Diagnosis not present

## 2021-09-03 DIAGNOSIS — R2689 Other abnormalities of gait and mobility: Secondary | ICD-10-CM | POA: Diagnosis not present

## 2021-09-03 DIAGNOSIS — Z9183 Wandering in diseases classified elsewhere: Secondary | ICD-10-CM | POA: Diagnosis not present

## 2021-09-03 DIAGNOSIS — F0390 Unspecified dementia without behavioral disturbance: Secondary | ICD-10-CM | POA: Diagnosis not present

## 2021-09-04 DIAGNOSIS — R2689 Other abnormalities of gait and mobility: Secondary | ICD-10-CM | POA: Diagnosis not present

## 2021-09-04 DIAGNOSIS — M6281 Muscle weakness (generalized): Secondary | ICD-10-CM | POA: Diagnosis not present

## 2021-09-05 DIAGNOSIS — M6281 Muscle weakness (generalized): Secondary | ICD-10-CM | POA: Diagnosis not present

## 2021-09-05 DIAGNOSIS — M25561 Pain in right knee: Secondary | ICD-10-CM | POA: Diagnosis not present

## 2021-09-05 DIAGNOSIS — R2689 Other abnormalities of gait and mobility: Secondary | ICD-10-CM | POA: Diagnosis not present

## 2021-09-06 DIAGNOSIS — Z7689 Persons encountering health services in other specified circumstances: Secondary | ICD-10-CM | POA: Diagnosis not present

## 2021-09-06 DIAGNOSIS — N1832 Chronic kidney disease, stage 3b: Secondary | ICD-10-CM | POA: Diagnosis not present

## 2021-09-06 DIAGNOSIS — E860 Dehydration: Secondary | ICD-10-CM | POA: Diagnosis not present

## 2021-09-06 DIAGNOSIS — R2689 Other abnormalities of gait and mobility: Secondary | ICD-10-CM | POA: Diagnosis not present

## 2021-09-06 DIAGNOSIS — M6281 Muscle weakness (generalized): Secondary | ICD-10-CM | POA: Diagnosis not present

## 2021-09-06 DIAGNOSIS — M25561 Pain in right knee: Secondary | ICD-10-CM | POA: Diagnosis not present

## 2021-09-07 DIAGNOSIS — M6281 Muscle weakness (generalized): Secondary | ICD-10-CM | POA: Diagnosis not present

## 2021-09-07 DIAGNOSIS — R2689 Other abnormalities of gait and mobility: Secondary | ICD-10-CM | POA: Diagnosis not present

## 2021-09-11 DIAGNOSIS — M6281 Muscle weakness (generalized): Secondary | ICD-10-CM | POA: Diagnosis not present

## 2021-09-11 DIAGNOSIS — R2689 Other abnormalities of gait and mobility: Secondary | ICD-10-CM | POA: Diagnosis not present

## 2021-09-12 DIAGNOSIS — M6281 Muscle weakness (generalized): Secondary | ICD-10-CM | POA: Diagnosis not present

## 2021-09-12 DIAGNOSIS — R2689 Other abnormalities of gait and mobility: Secondary | ICD-10-CM | POA: Diagnosis not present

## 2021-09-13 DIAGNOSIS — M6281 Muscle weakness (generalized): Secondary | ICD-10-CM | POA: Diagnosis not present

## 2021-09-13 DIAGNOSIS — R2689 Other abnormalities of gait and mobility: Secondary | ICD-10-CM | POA: Diagnosis not present

## 2021-09-14 DIAGNOSIS — R2689 Other abnormalities of gait and mobility: Secondary | ICD-10-CM | POA: Diagnosis not present

## 2021-09-14 DIAGNOSIS — M6281 Muscle weakness (generalized): Secondary | ICD-10-CM | POA: Diagnosis not present

## 2021-09-17 DIAGNOSIS — R2689 Other abnormalities of gait and mobility: Secondary | ICD-10-CM | POA: Diagnosis not present

## 2021-09-17 DIAGNOSIS — M6281 Muscle weakness (generalized): Secondary | ICD-10-CM | POA: Diagnosis not present

## 2021-09-18 DIAGNOSIS — M6281 Muscle weakness (generalized): Secondary | ICD-10-CM | POA: Diagnosis not present

## 2021-09-18 DIAGNOSIS — R2689 Other abnormalities of gait and mobility: Secondary | ICD-10-CM | POA: Diagnosis not present

## 2021-09-20 DIAGNOSIS — M19022 Primary osteoarthritis, left elbow: Secondary | ICD-10-CM | POA: Diagnosis not present

## 2021-09-20 DIAGNOSIS — R2689 Other abnormalities of gait and mobility: Secondary | ICD-10-CM | POA: Diagnosis not present

## 2021-09-20 DIAGNOSIS — Z9181 History of falling: Secondary | ICD-10-CM | POA: Diagnosis not present

## 2021-09-20 DIAGNOSIS — M6281 Muscle weakness (generalized): Secondary | ICD-10-CM | POA: Diagnosis not present

## 2021-09-20 DIAGNOSIS — M25522 Pain in left elbow: Secondary | ICD-10-CM | POA: Diagnosis not present

## 2021-09-21 DIAGNOSIS — M25522 Pain in left elbow: Secondary | ICD-10-CM | POA: Diagnosis not present

## 2021-09-21 DIAGNOSIS — Z7689 Persons encountering health services in other specified circumstances: Secondary | ICD-10-CM | POA: Diagnosis not present

## 2021-09-21 DIAGNOSIS — M5451 Vertebrogenic low back pain: Secondary | ICD-10-CM | POA: Diagnosis not present

## 2021-09-21 DIAGNOSIS — D649 Anemia, unspecified: Secondary | ICD-10-CM | POA: Diagnosis not present

## 2021-09-21 DIAGNOSIS — D696 Thrombocytopenia, unspecified: Secondary | ICD-10-CM | POA: Diagnosis not present

## 2021-11-06 DIAGNOSIS — F03918 Unspecified dementia, unspecified severity, with other behavioral disturbance: Secondary | ICD-10-CM | POA: Diagnosis not present

## 2021-11-07 DIAGNOSIS — F0390 Unspecified dementia without behavioral disturbance: Secondary | ICD-10-CM | POA: Diagnosis not present

## 2021-11-07 DIAGNOSIS — N390 Urinary tract infection, site not specified: Secondary | ICD-10-CM | POA: Diagnosis not present

## 2021-11-07 DIAGNOSIS — N401 Enlarged prostate with lower urinary tract symptoms: Secondary | ICD-10-CM | POA: Diagnosis not present

## 2021-11-08 DIAGNOSIS — N1831 Chronic kidney disease, stage 3a: Secondary | ICD-10-CM | POA: Diagnosis not present

## 2021-11-08 DIAGNOSIS — D649 Anemia, unspecified: Secondary | ICD-10-CM | POA: Diagnosis not present

## 2021-11-08 DIAGNOSIS — F03918 Unspecified dementia, unspecified severity, with other behavioral disturbance: Secondary | ICD-10-CM | POA: Diagnosis not present

## 2021-11-26 DIAGNOSIS — Z9181 History of falling: Secondary | ICD-10-CM | POA: Diagnosis not present

## 2021-11-26 DIAGNOSIS — R2689 Other abnormalities of gait and mobility: Secondary | ICD-10-CM | POA: Diagnosis not present

## 2021-11-26 DIAGNOSIS — E860 Dehydration: Secondary | ICD-10-CM | POA: Diagnosis not present

## 2021-11-26 DIAGNOSIS — M6281 Muscle weakness (generalized): Secondary | ICD-10-CM | POA: Diagnosis not present

## 2021-11-26 DIAGNOSIS — F03918 Unspecified dementia, unspecified severity, with other behavioral disturbance: Secondary | ICD-10-CM | POA: Diagnosis not present

## 2021-11-28 DIAGNOSIS — R279 Unspecified lack of coordination: Secondary | ICD-10-CM | POA: Diagnosis not present

## 2021-11-28 DIAGNOSIS — R2689 Other abnormalities of gait and mobility: Secondary | ICD-10-CM | POA: Diagnosis not present

## 2021-11-28 DIAGNOSIS — R2681 Unsteadiness on feet: Secondary | ICD-10-CM | POA: Diagnosis not present

## 2021-11-28 DIAGNOSIS — R296 Repeated falls: Secondary | ICD-10-CM | POA: Diagnosis not present

## 2021-11-28 DIAGNOSIS — M6281 Muscle weakness (generalized): Secondary | ICD-10-CM | POA: Diagnosis not present

## 2021-11-29 DIAGNOSIS — R2681 Unsteadiness on feet: Secondary | ICD-10-CM | POA: Diagnosis not present

## 2021-11-29 DIAGNOSIS — M6281 Muscle weakness (generalized): Secondary | ICD-10-CM | POA: Diagnosis not present

## 2021-11-29 DIAGNOSIS — R279 Unspecified lack of coordination: Secondary | ICD-10-CM | POA: Diagnosis not present

## 2021-11-29 DIAGNOSIS — R2689 Other abnormalities of gait and mobility: Secondary | ICD-10-CM | POA: Diagnosis not present

## 2021-11-29 DIAGNOSIS — F0390 Unspecified dementia without behavioral disturbance: Secondary | ICD-10-CM | POA: Diagnosis not present

## 2021-11-29 DIAGNOSIS — R296 Repeated falls: Secondary | ICD-10-CM | POA: Diagnosis not present

## 2021-11-30 DIAGNOSIS — D696 Thrombocytopenia, unspecified: Secondary | ICD-10-CM | POA: Diagnosis not present

## 2021-11-30 DIAGNOSIS — N1832 Chronic kidney disease, stage 3b: Secondary | ICD-10-CM | POA: Diagnosis not present

## 2021-11-30 DIAGNOSIS — F03918 Unspecified dementia, unspecified severity, with other behavioral disturbance: Secondary | ICD-10-CM | POA: Diagnosis not present

## 2021-11-30 DIAGNOSIS — D649 Anemia, unspecified: Secondary | ICD-10-CM | POA: Diagnosis not present

## 2021-11-30 DIAGNOSIS — E860 Dehydration: Secondary | ICD-10-CM | POA: Diagnosis not present

## 2021-11-30 DIAGNOSIS — N390 Urinary tract infection, site not specified: Secondary | ICD-10-CM | POA: Diagnosis not present

## 2021-12-03 DIAGNOSIS — F03B3 Unspecified dementia, moderate, with mood disturbance: Secondary | ICD-10-CM | POA: Diagnosis not present

## 2021-12-04 DIAGNOSIS — R279 Unspecified lack of coordination: Secondary | ICD-10-CM | POA: Diagnosis not present

## 2021-12-04 DIAGNOSIS — R2681 Unsteadiness on feet: Secondary | ICD-10-CM | POA: Diagnosis not present

## 2021-12-04 DIAGNOSIS — M6281 Muscle weakness (generalized): Secondary | ICD-10-CM | POA: Diagnosis not present

## 2021-12-04 DIAGNOSIS — R2689 Other abnormalities of gait and mobility: Secondary | ICD-10-CM | POA: Diagnosis not present

## 2021-12-04 DIAGNOSIS — R296 Repeated falls: Secondary | ICD-10-CM | POA: Diagnosis not present

## 2021-12-05 DIAGNOSIS — R2689 Other abnormalities of gait and mobility: Secondary | ICD-10-CM | POA: Diagnosis not present

## 2021-12-05 DIAGNOSIS — F331 Major depressive disorder, recurrent, moderate: Secondary | ICD-10-CM | POA: Diagnosis not present

## 2021-12-05 DIAGNOSIS — M6281 Muscle weakness (generalized): Secondary | ICD-10-CM | POA: Diagnosis not present

## 2021-12-05 DIAGNOSIS — R279 Unspecified lack of coordination: Secondary | ICD-10-CM | POA: Diagnosis not present

## 2021-12-05 DIAGNOSIS — F0393 Unspecified dementia, unspecified severity, with mood disturbance: Secondary | ICD-10-CM | POA: Diagnosis not present

## 2021-12-05 DIAGNOSIS — R296 Repeated falls: Secondary | ICD-10-CM | POA: Diagnosis not present

## 2021-12-05 DIAGNOSIS — R2681 Unsteadiness on feet: Secondary | ICD-10-CM | POA: Diagnosis not present

## 2021-12-06 DIAGNOSIS — M6281 Muscle weakness (generalized): Secondary | ICD-10-CM | POA: Diagnosis not present

## 2021-12-06 DIAGNOSIS — R296 Repeated falls: Secondary | ICD-10-CM | POA: Diagnosis not present

## 2021-12-06 DIAGNOSIS — R2689 Other abnormalities of gait and mobility: Secondary | ICD-10-CM | POA: Diagnosis not present

## 2021-12-06 DIAGNOSIS — R2681 Unsteadiness on feet: Secondary | ICD-10-CM | POA: Diagnosis not present

## 2021-12-06 DIAGNOSIS — R279 Unspecified lack of coordination: Secondary | ICD-10-CM | POA: Diagnosis not present

## 2021-12-10 DIAGNOSIS — R296 Repeated falls: Secondary | ICD-10-CM | POA: Diagnosis not present

## 2021-12-10 DIAGNOSIS — R2681 Unsteadiness on feet: Secondary | ICD-10-CM | POA: Diagnosis not present

## 2021-12-10 DIAGNOSIS — R279 Unspecified lack of coordination: Secondary | ICD-10-CM | POA: Diagnosis not present

## 2021-12-10 DIAGNOSIS — M6281 Muscle weakness (generalized): Secondary | ICD-10-CM | POA: Diagnosis not present

## 2021-12-10 DIAGNOSIS — R2689 Other abnormalities of gait and mobility: Secondary | ICD-10-CM | POA: Diagnosis not present

## 2021-12-12 DIAGNOSIS — R2681 Unsteadiness on feet: Secondary | ICD-10-CM | POA: Diagnosis not present

## 2021-12-12 DIAGNOSIS — R296 Repeated falls: Secondary | ICD-10-CM | POA: Diagnosis not present

## 2021-12-12 DIAGNOSIS — R279 Unspecified lack of coordination: Secondary | ICD-10-CM | POA: Diagnosis not present

## 2021-12-12 DIAGNOSIS — R2689 Other abnormalities of gait and mobility: Secondary | ICD-10-CM | POA: Diagnosis not present

## 2021-12-12 DIAGNOSIS — M6281 Muscle weakness (generalized): Secondary | ICD-10-CM | POA: Diagnosis not present

## 2021-12-17 DIAGNOSIS — F331 Major depressive disorder, recurrent, moderate: Secondary | ICD-10-CM | POA: Diagnosis not present

## 2021-12-17 DIAGNOSIS — F0393 Unspecified dementia, unspecified severity, with mood disturbance: Secondary | ICD-10-CM | POA: Diagnosis not present

## 2021-12-17 DIAGNOSIS — I69898 Other sequelae of other cerebrovascular disease: Secondary | ICD-10-CM | POA: Diagnosis not present

## 2021-12-17 DIAGNOSIS — R2681 Unsteadiness on feet: Secondary | ICD-10-CM | POA: Diagnosis not present

## 2021-12-17 DIAGNOSIS — M6281 Muscle weakness (generalized): Secondary | ICD-10-CM | POA: Diagnosis not present

## 2021-12-17 DIAGNOSIS — R296 Repeated falls: Secondary | ICD-10-CM | POA: Diagnosis not present

## 2021-12-17 DIAGNOSIS — R2689 Other abnormalities of gait and mobility: Secondary | ICD-10-CM | POA: Diagnosis not present

## 2021-12-17 DIAGNOSIS — F0153 Vascular dementia, unspecified severity, with mood disturbance: Secondary | ICD-10-CM | POA: Diagnosis not present

## 2021-12-17 DIAGNOSIS — R279 Unspecified lack of coordination: Secondary | ICD-10-CM | POA: Diagnosis not present

## 2021-12-19 DIAGNOSIS — R279 Unspecified lack of coordination: Secondary | ICD-10-CM | POA: Diagnosis not present

## 2021-12-19 DIAGNOSIS — M6281 Muscle weakness (generalized): Secondary | ICD-10-CM | POA: Diagnosis not present

## 2021-12-19 DIAGNOSIS — R2681 Unsteadiness on feet: Secondary | ICD-10-CM | POA: Diagnosis not present

## 2021-12-19 DIAGNOSIS — R296 Repeated falls: Secondary | ICD-10-CM | POA: Diagnosis not present

## 2021-12-19 DIAGNOSIS — R2689 Other abnormalities of gait and mobility: Secondary | ICD-10-CM | POA: Diagnosis not present

## 2021-12-20 DIAGNOSIS — F03911 Unspecified dementia, unspecified severity, with agitation: Secondary | ICD-10-CM | POA: Diagnosis not present

## 2021-12-20 DIAGNOSIS — Z87440 Personal history of urinary (tract) infections: Secondary | ICD-10-CM | POA: Diagnosis not present

## 2021-12-20 DIAGNOSIS — E860 Dehydration: Secondary | ICD-10-CM | POA: Diagnosis not present

## 2021-12-21 DIAGNOSIS — R2681 Unsteadiness on feet: Secondary | ICD-10-CM | POA: Diagnosis not present

## 2021-12-21 DIAGNOSIS — R296 Repeated falls: Secondary | ICD-10-CM | POA: Diagnosis not present

## 2021-12-21 DIAGNOSIS — D649 Anemia, unspecified: Secondary | ICD-10-CM | POA: Diagnosis not present

## 2021-12-21 DIAGNOSIS — N390 Urinary tract infection, site not specified: Secondary | ICD-10-CM | POA: Diagnosis not present

## 2021-12-21 DIAGNOSIS — M6281 Muscle weakness (generalized): Secondary | ICD-10-CM | POA: Diagnosis not present

## 2021-12-21 DIAGNOSIS — I10 Essential (primary) hypertension: Secondary | ICD-10-CM | POA: Diagnosis not present

## 2021-12-21 DIAGNOSIS — R2689 Other abnormalities of gait and mobility: Secondary | ICD-10-CM | POA: Diagnosis not present

## 2021-12-24 DIAGNOSIS — Z9181 History of falling: Secondary | ICD-10-CM | POA: Diagnosis not present

## 2021-12-24 DIAGNOSIS — W19XXXA Unspecified fall, initial encounter: Secondary | ICD-10-CM | POA: Diagnosis not present

## 2021-12-24 DIAGNOSIS — I69898 Other sequelae of other cerebrovascular disease: Secondary | ICD-10-CM | POA: Diagnosis not present

## 2021-12-24 DIAGNOSIS — F0153 Vascular dementia, unspecified severity, with mood disturbance: Secondary | ICD-10-CM | POA: Diagnosis not present

## 2021-12-24 DIAGNOSIS — F03911 Unspecified dementia, unspecified severity, with agitation: Secondary | ICD-10-CM | POA: Diagnosis not present

## 2021-12-24 DIAGNOSIS — M6281 Muscle weakness (generalized): Secondary | ICD-10-CM | POA: Diagnosis not present

## 2021-12-24 DIAGNOSIS — R2689 Other abnormalities of gait and mobility: Secondary | ICD-10-CM | POA: Diagnosis not present

## 2021-12-26 DIAGNOSIS — S70312A Abrasion, left thigh, initial encounter: Secondary | ICD-10-CM | POA: Diagnosis not present

## 2021-12-26 DIAGNOSIS — R2681 Unsteadiness on feet: Secondary | ICD-10-CM | POA: Diagnosis not present

## 2021-12-26 DIAGNOSIS — M6281 Muscle weakness (generalized): Secondary | ICD-10-CM | POA: Diagnosis not present

## 2021-12-26 DIAGNOSIS — F03911 Unspecified dementia, unspecified severity, with agitation: Secondary | ICD-10-CM | POA: Diagnosis not present

## 2021-12-26 DIAGNOSIS — R2689 Other abnormalities of gait and mobility: Secondary | ICD-10-CM | POA: Diagnosis not present

## 2021-12-26 DIAGNOSIS — S70311A Abrasion, right thigh, initial encounter: Secondary | ICD-10-CM | POA: Diagnosis not present

## 2021-12-26 DIAGNOSIS — R296 Repeated falls: Secondary | ICD-10-CM | POA: Diagnosis not present

## 2021-12-31 DIAGNOSIS — F419 Anxiety disorder, unspecified: Secondary | ICD-10-CM | POA: Diagnosis not present

## 2021-12-31 DIAGNOSIS — F331 Major depressive disorder, recurrent, moderate: Secondary | ICD-10-CM | POA: Diagnosis not present

## 2021-12-31 DIAGNOSIS — F0153 Vascular dementia, unspecified severity, with mood disturbance: Secondary | ICD-10-CM | POA: Diagnosis not present

## 2021-12-31 DIAGNOSIS — I69898 Other sequelae of other cerebrovascular disease: Secondary | ICD-10-CM | POA: Diagnosis not present

## 2022-01-02 DIAGNOSIS — M6281 Muscle weakness (generalized): Secondary | ICD-10-CM | POA: Diagnosis not present

## 2022-01-02 DIAGNOSIS — R2689 Other abnormalities of gait and mobility: Secondary | ICD-10-CM | POA: Diagnosis not present

## 2022-01-02 DIAGNOSIS — R2681 Unsteadiness on feet: Secondary | ICD-10-CM | POA: Diagnosis not present

## 2022-01-02 DIAGNOSIS — R296 Repeated falls: Secondary | ICD-10-CM | POA: Diagnosis not present

## 2022-02-01 DIAGNOSIS — F0394 Unspecified dementia, unspecified severity, with anxiety: Secondary | ICD-10-CM | POA: Diagnosis not present

## 2022-02-01 DIAGNOSIS — E785 Hyperlipidemia, unspecified: Secondary | ICD-10-CM | POA: Diagnosis not present

## 2022-02-01 DIAGNOSIS — F331 Major depressive disorder, recurrent, moderate: Secondary | ICD-10-CM

## 2022-02-01 DIAGNOSIS — G4700 Insomnia, unspecified: Secondary | ICD-10-CM | POA: Diagnosis not present

## 2022-02-01 DIAGNOSIS — Z8673 Personal history of transient ischemic attack (TIA), and cerebral infarction without residual deficits: Secondary | ICD-10-CM

## 2022-02-01 DIAGNOSIS — N400 Enlarged prostate without lower urinary tract symptoms: Secondary | ICD-10-CM | POA: Diagnosis not present

## 2022-02-01 DIAGNOSIS — Z6824 Body mass index (BMI) 24.0-24.9, adult: Secondary | ICD-10-CM

## 2022-03-22 DIAGNOSIS — R5383 Other fatigue: Secondary | ICD-10-CM | POA: Diagnosis not present

## 2022-03-22 DIAGNOSIS — N1832 Chronic kidney disease, stage 3b: Secondary | ICD-10-CM | POA: Diagnosis not present

## 2022-03-22 DIAGNOSIS — E860 Dehydration: Secondary | ICD-10-CM | POA: Diagnosis not present

## 2022-03-22 DIAGNOSIS — D649 Anemia, unspecified: Secondary | ICD-10-CM | POA: Diagnosis not present

## 2022-06-04 DIAGNOSIS — R400 Somnolence: Secondary | ICD-10-CM | POA: Diagnosis not present

## 2022-06-04 DIAGNOSIS — M6281 Muscle weakness (generalized): Secondary | ICD-10-CM

## 2022-06-04 DIAGNOSIS — R059 Cough, unspecified: Secondary | ICD-10-CM | POA: Diagnosis not present

## 2022-06-04 DIAGNOSIS — K219 Gastro-esophageal reflux disease without esophagitis: Secondary | ICD-10-CM

## 2022-06-04 DIAGNOSIS — F0390 Unspecified dementia without behavioral disturbance: Secondary | ICD-10-CM | POA: Diagnosis not present

## 2022-06-04 DIAGNOSIS — I69391 Dysphagia following cerebral infarction: Secondary | ICD-10-CM | POA: Diagnosis not present

## 2022-06-05 DIAGNOSIS — R059 Cough, unspecified: Secondary | ICD-10-CM | POA: Diagnosis not present

## 2022-06-05 DIAGNOSIS — R400 Somnolence: Secondary | ICD-10-CM | POA: Diagnosis not present

## 2022-06-05 DIAGNOSIS — F0390 Unspecified dementia without behavioral disturbance: Secondary | ICD-10-CM | POA: Diagnosis not present

## 2022-06-05 DIAGNOSIS — K219 Gastro-esophageal reflux disease without esophagitis: Secondary | ICD-10-CM

## 2022-06-05 DIAGNOSIS — I69391 Dysphagia following cerebral infarction: Secondary | ICD-10-CM | POA: Diagnosis not present

## 2022-06-05 DIAGNOSIS — M6281 Muscle weakness (generalized): Secondary | ICD-10-CM

## 2022-07-25 IMAGING — CT CT ANGIO NECK
2 of 11 series · 7 of 33 positions shown · IV contrast (OMNI 350)
Comparison: None.

CLINICAL DATA: Neck and head pain with dizziness

EXAM:
CT ANGIOGRAPHY HEAD AND NECK
TECHNIQUE: Multidetector CT imaging of the head and neck was performed using
the standard protocol during bolus administration of intravenous
contrast. Multiplanar CT image reconstructions and MIPs were
obtained to evaluate the vascular anatomy. Carotid stenosis
measurements (when applicable) are obtained utilizing NASCET
criteria, using the distal internal carotid diameter as the
denominator.
CONTRAST:  75mL OMNIPAQUE IOHEXOL 350 MG/ML SOLN

[Series 9: cta neck · axial · 0.53mm/px · z∈[-244,-116]mm · 2 of 189 slices shown]
[im 63/189  soft-tissue]
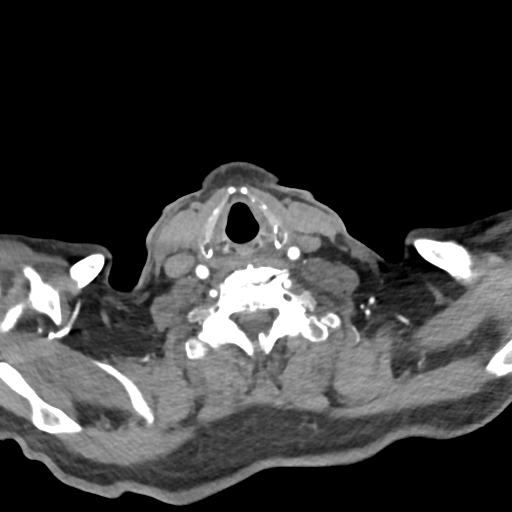
[im 126/189  soft-tissue]
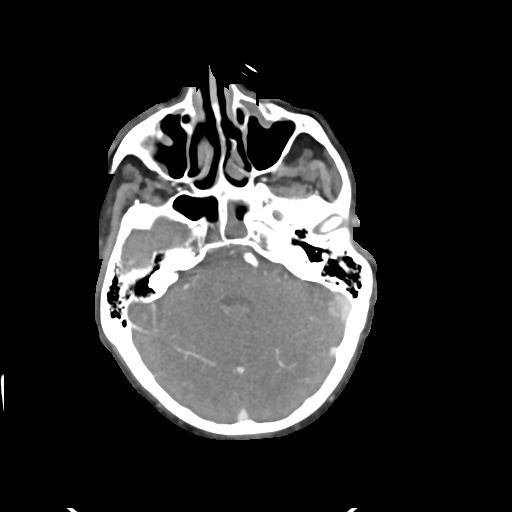

[Series 11: cta neck axial · axial · 0.51mm/px · z∈[-304,-52]mm · 5 of 378 slices shown]
[im 63/378  soft-tissue]
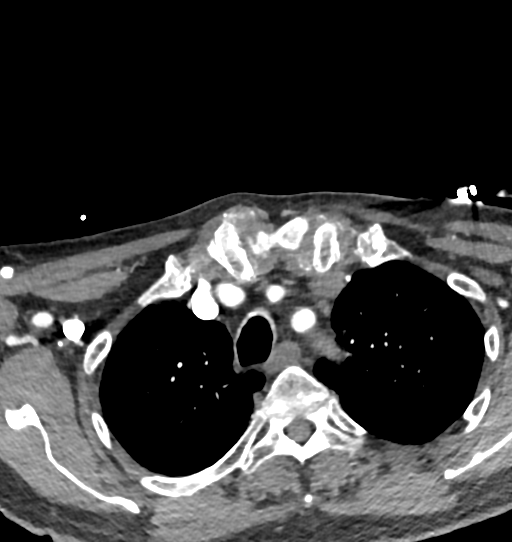
[im 126/378  bone]
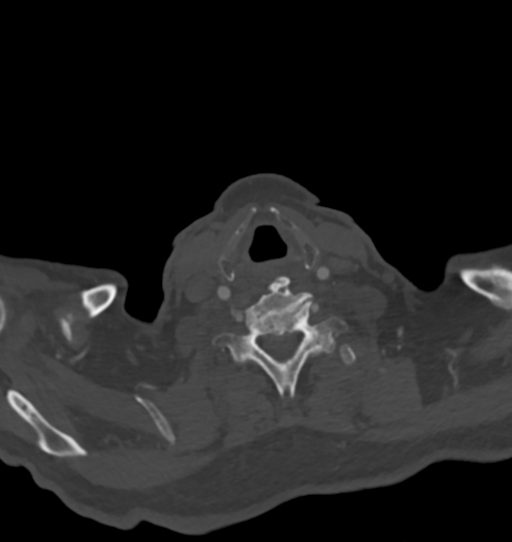
[im 189/378  soft-tissue]
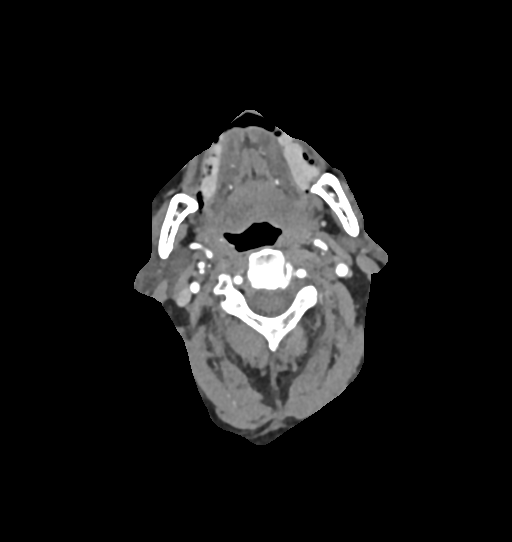
[im 252/378  bone]
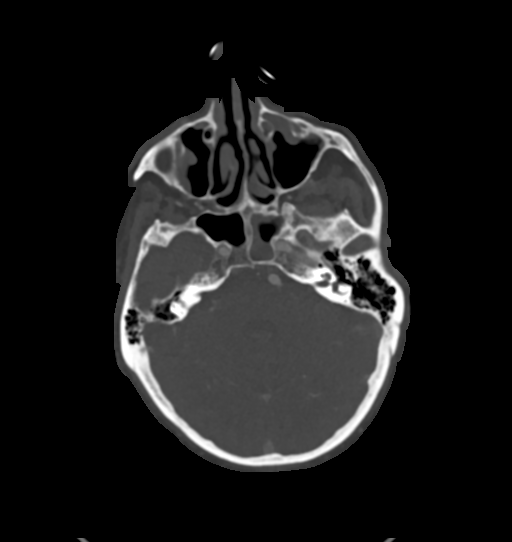
[im 315/378  soft-tissue]
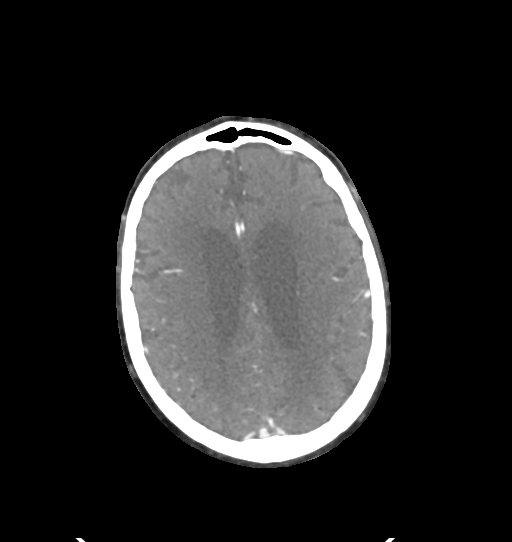

[7 of 33 positions shown; findings below may reference images not displayed]

FINDINGS: CT HEAD FINDINGS

Brain: There is no mass, hemorrhage or extra-axial collection. There
is generalized atrophy without lobar predilection. There is an old
right basal ganglia small vessel infarct. There is hypoattenuation
of the periventricular white matter, most commonly indicating
chronic ischemic microangiopathy.

Skull: The visualized skull base, calvarium and extracranial soft
tissues are normal.

Sinuses/Orbits: Ethmoid, maxillary and sphenoid sinus mucosal
thickening. The orbits are normal.

CTA NECK FINDINGS

SKELETON: There is no bony spinal canal stenosis. No lytic or
blastic lesion.

OTHER NECK: Normal pharynx, larynx and major salivary glands. No
cervical lymphadenopathy. Unremarkable thyroid gland.

UPPER CHEST: No pneumothorax or pleural effusion. No nodules or
masses.

AORTIC ARCH:

There is no calcific atherosclerosis of the aortic arch. There is no
aneurysm, dissection or hemodynamically significant stenosis of the
visualized portion of the aorta. Conventional 3 vessel aortic
branching pattern. The visualized proximal subclavian arteries are
widely patent.

RIGHT CAROTID SYSTEM: Normal without aneurysm, dissection or
stenosis.

LEFT CAROTID SYSTEM: Normal without aneurysm, dissection or
stenosis.

VERTEBRAL ARTERIES: Left dominant configuration. Both origins are
clearly patent. There is no dissection, occlusion or flow-limiting
stenosis to the skull base (V1-V3 segments).

CTA HEAD FINDINGS

POSTERIOR CIRCULATION:

--Vertebral arteries: Normal V4 segments.

--Inferior cerebellar arteries: Normal.

--Basilar artery: Normal.

--Superior cerebellar arteries: Normal.

--Posterior cerebral arteries (PCA): Normal.

ANTERIOR CIRCULATION:

--Intracranial internal carotid arteries: Atherosclerotic
calcification of the internal carotid arteries at the skull base
without hemodynamically significant stenosis.

--Anterior cerebral arteries (ACA): Normal. Both A1 segments are
present. Patent anterior communicating artery (a-comm).

--Middle cerebral arteries (MCA): Normal.

VENOUS SINUSES: As permitted by contrast timing, patent.

ANATOMIC VARIANTS: None

Review of the MIP images confirms the above findings.
IMPRESSION: 1. No emergent large vessel occlusion or high-grade stenosis of the
head or neck.
2. Old right basal ganglia small vessel infarct and generalized
atrophy.

## 2022-10-17 DIAGNOSIS — F411 Generalized anxiety disorder: Secondary | ICD-10-CM

## 2022-10-17 DIAGNOSIS — F0390 Unspecified dementia without behavioral disturbance: Secondary | ICD-10-CM | POA: Diagnosis not present

## 2022-10-17 DIAGNOSIS — I2510 Atherosclerotic heart disease of native coronary artery without angina pectoris: Secondary | ICD-10-CM | POA: Diagnosis not present

## 2022-10-17 DIAGNOSIS — F330 Major depressive disorder, recurrent, mild: Secondary | ICD-10-CM | POA: Diagnosis not present

## 2022-10-17 DIAGNOSIS — E039 Hypothyroidism, unspecified: Secondary | ICD-10-CM | POA: Diagnosis not present

## 2022-12-02 ENCOUNTER — Other Ambulatory Visit: Payer: Self-pay

## 2022-12-02 ENCOUNTER — Emergency Department (HOSPITAL_COMMUNITY): Payer: Medicare Other

## 2022-12-02 ENCOUNTER — Inpatient Hospital Stay (HOSPITAL_COMMUNITY): Payer: Medicare Other

## 2022-12-02 ENCOUNTER — Emergency Department (HOSPITAL_COMMUNITY)

## 2022-12-02 ENCOUNTER — Inpatient Hospital Stay (HOSPITAL_COMMUNITY)
Admission: EM | Admit: 2022-12-02 | Discharge: 2022-12-03 | DRG: 535 | Disposition: A | Discharge status: Hospice | Source: Skilled Nursing Facility | Attending: Internal Medicine | Admitting: Internal Medicine

## 2022-12-02 ENCOUNTER — Telehealth: Payer: Self-pay

## 2022-12-02 ENCOUNTER — Encounter (HOSPITAL_COMMUNITY): Payer: Self-pay

## 2022-12-02 ENCOUNTER — Inpatient Hospital Stay (HOSPITAL_COMMUNITY)

## 2022-12-02 DIAGNOSIS — Z801 Family history of malignant neoplasm of trachea, bronchus and lung: Secondary | ICD-10-CM

## 2022-12-02 DIAGNOSIS — D696 Thrombocytopenia, unspecified: Secondary | ICD-10-CM | POA: Diagnosis present

## 2022-12-02 DIAGNOSIS — Z8673 Personal history of transient ischemic attack (TIA), and cerebral infarction without residual deficits: Secondary | ICD-10-CM | POA: Diagnosis not present

## 2022-12-02 DIAGNOSIS — Z87891 Personal history of nicotine dependence: Secondary | ICD-10-CM

## 2022-12-02 DIAGNOSIS — Z833 Family history of diabetes mellitus: Secondary | ICD-10-CM | POA: Diagnosis not present

## 2022-12-02 DIAGNOSIS — Z79899 Other long term (current) drug therapy: Secondary | ICD-10-CM | POA: Diagnosis not present

## 2022-12-02 DIAGNOSIS — W1830XA Fall on same level, unspecified, initial encounter: Secondary | ICD-10-CM | POA: Diagnosis present

## 2022-12-02 DIAGNOSIS — Z66 Do not resuscitate: Secondary | ICD-10-CM | POA: Diagnosis present

## 2022-12-02 DIAGNOSIS — Z955 Presence of coronary angioplasty implant and graft: Secondary | ICD-10-CM

## 2022-12-02 DIAGNOSIS — N1831 Chronic kidney disease, stage 3a: Secondary | ICD-10-CM | POA: Diagnosis present

## 2022-12-02 DIAGNOSIS — I2510 Atherosclerotic heart disease of native coronary artery without angina pectoris: Secondary | ICD-10-CM | POA: Diagnosis present

## 2022-12-02 DIAGNOSIS — E039 Hypothyroidism, unspecified: Secondary | ICD-10-CM | POA: Diagnosis present

## 2022-12-02 DIAGNOSIS — J9601 Acute respiratory failure with hypoxia: Secondary | ICD-10-CM | POA: Diagnosis present

## 2022-12-02 DIAGNOSIS — Y92129 Unspecified place in nursing home as the place of occurrence of the external cause: Secondary | ICD-10-CM

## 2022-12-02 DIAGNOSIS — Z79890 Hormone replacement therapy (postmenopausal): Secondary | ICD-10-CM | POA: Diagnosis not present

## 2022-12-02 DIAGNOSIS — Z7902 Long term (current) use of antithrombotics/antiplatelets: Secondary | ICD-10-CM | POA: Diagnosis not present

## 2022-12-02 DIAGNOSIS — I10 Essential (primary) hypertension: Secondary | ICD-10-CM | POA: Diagnosis present

## 2022-12-02 DIAGNOSIS — Z1152 Encounter for screening for COVID-19: Secondary | ICD-10-CM

## 2022-12-02 DIAGNOSIS — Z8249 Family history of ischemic heart disease and other diseases of the circulatory system: Secondary | ICD-10-CM | POA: Diagnosis not present

## 2022-12-02 DIAGNOSIS — S72101A Unspecified trochanteric fracture of right femur, initial encounter for closed fracture: Secondary | ICD-10-CM | POA: Diagnosis present

## 2022-12-02 DIAGNOSIS — J189 Pneumonia, unspecified organism: Principal | ICD-10-CM | POA: Diagnosis present

## 2022-12-02 DIAGNOSIS — F0390 Unspecified dementia without behavioral disturbance: Secondary | ICD-10-CM | POA: Diagnosis present

## 2022-12-02 DIAGNOSIS — Z888 Allergy status to other drugs, medicaments and biological substances status: Secondary | ICD-10-CM

## 2022-12-02 DIAGNOSIS — D649 Anemia, unspecified: Secondary | ICD-10-CM | POA: Diagnosis present

## 2022-12-02 DIAGNOSIS — S72001A Fracture of unspecified part of neck of right femur, initial encounter for closed fracture: Secondary | ICD-10-CM | POA: Diagnosis present

## 2022-12-02 DIAGNOSIS — I129 Hypertensive chronic kidney disease with stage 1 through stage 4 chronic kidney disease, or unspecified chronic kidney disease: Secondary | ICD-10-CM | POA: Diagnosis present

## 2022-12-02 DIAGNOSIS — Z515 Encounter for palliative care: Secondary | ICD-10-CM | POA: Diagnosis not present

## 2022-12-02 DIAGNOSIS — E783 Hyperchylomicronemia: Secondary | ICD-10-CM | POA: Diagnosis present

## 2022-12-02 DIAGNOSIS — Z803 Family history of malignant neoplasm of breast: Secondary | ICD-10-CM | POA: Diagnosis not present

## 2022-12-02 DIAGNOSIS — Z7189 Other specified counseling: Secondary | ICD-10-CM

## 2022-12-02 DIAGNOSIS — E785 Hyperlipidemia, unspecified: Secondary | ICD-10-CM | POA: Diagnosis present

## 2022-12-02 LAB — URINALYSIS, ROUTINE W REFLEX MICROSCOPIC
Bilirubin Urine: NEGATIVE
Glucose, UA: NEGATIVE mg/dL
Hgb urine dipstick: NEGATIVE
Ketones, ur: NEGATIVE mg/dL
Leukocytes,Ua: NEGATIVE
Nitrite: NEGATIVE
Protein, ur: 30 mg/dL — AB
Specific Gravity, Urine: 1.019 (ref 1.005–1.030)
pH: 7 (ref 5.0–8.0)

## 2022-12-02 LAB — CBC WITH DIFFERENTIAL/PLATELET
Abs Immature Granulocytes: 0.06 10*3/uL (ref 0.00–0.07)
Basophils Absolute: 0 10*3/uL (ref 0.0–0.1)
Basophils Relative: 0 %
Eosinophils Absolute: 0.7 10*3/uL — ABNORMAL HIGH (ref 0.0–0.5)
Eosinophils Relative: 7 %
HCT: 33 % — ABNORMAL LOW (ref 39.0–52.0)
Hemoglobin: 10.9 g/dL — ABNORMAL LOW (ref 13.0–17.0)
Immature Granulocytes: 1 %
Lymphocytes Relative: 16 %
Lymphs Abs: 1.5 10*3/uL (ref 0.7–4.0)
MCH: 33.1 pg (ref 26.0–34.0)
MCHC: 33 g/dL (ref 30.0–36.0)
MCV: 100.3 fL — ABNORMAL HIGH (ref 80.0–100.0)
Monocytes Absolute: 0.9 10*3/uL (ref 0.1–1.0)
Monocytes Relative: 9 %
Neutro Abs: 6.5 10*3/uL (ref 1.7–7.7)
Neutrophils Relative %: 67 %
Platelets: 130 10*3/uL — ABNORMAL LOW (ref 150–400)
RBC: 3.29 MIL/uL — ABNORMAL LOW (ref 4.22–5.81)
RDW: 13.5 % (ref 11.5–15.5)
WBC: 9.7 10*3/uL (ref 4.0–10.5)
nRBC: 0 % (ref 0.0–0.2)

## 2022-12-02 LAB — RESP PANEL BY RT-PCR (RSV, FLU A&B, COVID)  RVPGX2
Influenza A by PCR: NEGATIVE
Influenza B by PCR: NEGATIVE
Resp Syncytial Virus by PCR: NEGATIVE
SARS Coronavirus 2 by RT PCR: NEGATIVE

## 2022-12-02 LAB — BASIC METABOLIC PANEL
Anion gap: 9 (ref 5–15)
BUN: 36 mg/dL — ABNORMAL HIGH (ref 8–23)
CO2: 25 mmol/L (ref 22–32)
Calcium: 8.7 mg/dL — ABNORMAL LOW (ref 8.9–10.3)
Chloride: 102 mmol/L (ref 98–111)
Creatinine, Ser: 1.47 mg/dL — ABNORMAL HIGH (ref 0.61–1.24)
GFR, Estimated: 45 mL/min — ABNORMAL LOW (ref >60)
Glucose, Bld: 129 mg/dL — ABNORMAL HIGH (ref 70–99)
Potassium: 4.2 mmol/L (ref 3.5–5.1)
Sodium: 136 mmol/L (ref 135–145)

## 2022-12-02 LAB — TROPONIN I (HIGH SENSITIVITY)
Troponin I (High Sensitivity): 10 ng/L (ref <18)
Troponin I (High Sensitivity): 9 ng/L (ref <18)

## 2022-12-02 MED ORDER — FENTANYL CITRATE PF 50 MCG/ML IJ SOSY: 12.5000 ug | PREFILLED_SYRINGE | INTRAMUSCULAR | Status: DC | PRN

## 2022-12-02 MED ORDER — DEXTROSE 5 % IV SOLN
500.0000 mg | Freq: Once | INTRAVENOUS | Status: AC
  Administered 2022-12-02: 500 mg via INTRAVENOUS
  Filled 2022-12-02: qty 5

## 2022-12-02 MED ORDER — ENOXAPARIN SODIUM 40 MG/0.4ML IJ SOSY
40.0000 mg | PREFILLED_SYRINGE | INTRAMUSCULAR | Status: DC
  Administered 2022-12-02: 40 mg via SUBCUTANEOUS
  Filled 2022-12-02: qty 0.4

## 2022-12-02 MED ORDER — ONDANSETRON HCL 4 MG PO TABS: 4.0000 mg | ORAL_TABLET | Freq: Four times a day (QID) | ORAL | Status: DC | PRN

## 2022-12-02 MED ORDER — SODIUM CHLORIDE 0.9 % IV SOLN
1.0000 g | Freq: Once | INTRAVENOUS | Status: AC
  Administered 2022-12-02: 1 g via INTRAVENOUS
  Filled 2022-12-02: qty 10

## 2022-12-02 MED ORDER — ONDANSETRON HCL 4 MG/2ML IJ SOLN: 4.0000 mg | Freq: Four times a day (QID) | INTRAMUSCULAR | Status: DC | PRN

## 2022-12-02 MED ORDER — OXYCODONE HCL 5 MG PO TABS: 5.0000 mg | ORAL_TABLET | ORAL | Status: DC | PRN

## 2022-12-02 MED ORDER — ALBUTEROL SULFATE (2.5 MG/3ML) 0.083% IN NEBU: 2.5000 mg | INHALATION_SOLUTION | RESPIRATORY_TRACT | Status: DC | PRN

## 2022-12-02 MED ORDER — DEXTROSE 5 % IV SOLN
500.0000 mg | INTRAVENOUS | Status: DC
  Administered 2022-12-03: 500 mg via INTRAVENOUS
  Filled 2022-12-02: qty 5

## 2022-12-02 MED ORDER — ACETAMINOPHEN 325 MG PO TABS: 650.0000 mg | ORAL_TABLET | Freq: Four times a day (QID) | ORAL | Status: DC | PRN

## 2022-12-02 MED ORDER — ACETAMINOPHEN 650 MG RE SUPP: 650.0000 mg | Freq: Four times a day (QID) | RECTAL | Status: DC | PRN

## 2022-12-02 MED ORDER — SODIUM CHLORIDE 0.9 % IV SOLN
1.0000 g | INTRAVENOUS | Status: DC
  Administered 2022-12-03: 1 g via INTRAVENOUS
  Filled 2022-12-02: qty 10

## 2022-12-02 MED ORDER — TRANEXAMIC ACID-NACL 1000-0.7 MG/100ML-% IV SOLN: 1000.0000 mg | INTRAVENOUS | Status: DC

## 2022-12-02 MED ORDER — CEFAZOLIN SODIUM-DEXTROSE 2-4 GM/100ML-% IV SOLN: 2.0000 g | INTRAVENOUS | Status: DC

## 2022-12-02 NOTE — Consult Note (Signed)
Orthopedic Surgery Consult Note  Assessment: Patient is a 89 y.o. male with right pertrochanteric femur fracture   Plan: -Generally, would treat these fractures operatively for an ambulatory patient regardless of their age and medical co-morbidities. However, there are scenarios where a patient or family may elect to pursue non-operative treatment and be made comfortable. Patient is currently DNR. He is at an assisted living facility. He has no next of kin or HCPOA. He has a couple of friends listed as contacts. Given the lack of family or HCPOA to provide consent, spoke with ethics tonight who recommended a palliative care consult to establish goals of care and help with decision about pursuing comfort measures or operative fixation -Diet: NPO at midnight for now -DVT ppx: lovenox -Ancef and TXA on call to OR in case decision is made for operative management -Weight bearing status: NWB RLE -Pain control -Dispo: pending decision about operative versus non-operative treatment  ___________________________________________________________________________   Reason for consult: right hip fracture  History:  Patient is a 88 y.o. male who had a ground level fall at his facility yesterday. He was noted to have right hip pain. His pain persisted so he was brought to [facility] where he was found to have a right hip fracture. Orthopedics was consulted and he was transferred to [facility] for definitive fixation. He was admitted to a hospitalist service. He is currently on the floor. He is not answering questions or following commands this evening so no further history obtained. Past history below is from chart review.   Past medical history:  GERD Hypothyroidism TIA HTN CKD Chronic anemia Thrombocytopenia  Allergies: epinephrine, procaine, aricept, levonordefrin, mepivacaine   Past surgical history:  Carpal tunnel release Hernia repair L3/4 laminectomy and discectomy  Social history: No  active use of nicotine-containing products (cigarettes, vaping, smokeless, etc.) Alcohol use: none No use of recreational drugs   Physical Exam:  General: no acute distress, laying in bed Neurologic: awake, not answering questions, not following commands Cardiovascular: regular rate, no cyanosis Respiratory: unlabored breathing on room air  MSK:   -Bilateral upper extremities  No tenderness to palpation over extremity, no open wounds, no gross deformity Seen firing deltoids and biceps and wrist flexors bilaterally, no other motion seen. Not following commands or answering questions so no further sensorimotor exam obtained  Palpable radial pulse  Hand warm and well perfused  -Left lower extremity  No tenderness to palpation over extremity, no gross deformity, no open wounds, no pain with log roll Seen firing EHL/TA/GSC, no other sensorimotor exam due to patient condition Foot warm and well perfused  -Right lower extremity  No tenderness to palpation over extremity except at the hip, pain with log roll, leg shorter than contralateral side, no open wounds EHL/TA/GSC seen firing, no further sensorimotor exam due to patient condition Foot warm and well perfused  Imaging: XR of the right hip from 12/02/2022 was independently reviewed and interpreted, showing shortened pertrochanteric femur fracture with varus angulation.   CT of the right hip from 12/02/2022 was independently reviewed and interpreted, showing a comminuted pertrochanteric femur fracture in varus alignment that is shortened. No other fracture seen.    Patient name: Shawn Sanchez Patient MRN: [id number] Date: 12/02/22

## 2022-12-02 NOTE — ED Provider Notes (Signed)
[facility]-EMERGENCY DEPT [facility] Emergency Department Provider Note MRN:  [id number]  Arrival date & time: 12/02/22     Chief Complaint   Hip Pain and Fall   History of Present Illness   Shawn Sanchez is a 89 y.o. year-old male presents to the ED with chief complaint of fall yesterday.  Reportedly unwitnessed and had imaging at outside facility that showed hip fracture.  He complains of right hip pain.  He states that he hit his head.  He denies LOC.  Aspirin is listed on his medications, no other anticoagulants.  He doesn't wear oxygen, but is noted to be 85% on RA in triage and tachypneic to 30.  Now on supplemental O2 in exam room.  Patient is DNR and is currently on hospice.  No MOST form found.  History provided by patient.   Review of Systems  Pertinent positive and negative review of systems noted in HPI.    Physical Exam   Vitals:   12/02/22 0445 12/02/22 0500  BP:  (!) 141/75  Pulse: 80 86  Resp: 19 (!) 27  Temp:    SpO2: 93% 95%    CONSTITUTIONAL:  chronically ill-appearing, NAD NEURO:  Alert and oriented to person EYES:  eyes equal and reactive ENT/NECK:  Supple, no stridor  CARDIO:  normal rate, regular rhythm, appears well-perfused  PULM:  No respiratory distress, CTAB GI/GU:  non-distended,  MSK/SPINE:  TTP to right hip SKIN:  no rash, atraumatic   *Additional and/or pertinent findings included in MDM below  Diagnostic and Interventional Summary    EKG Interpretation Date/Time:    Ventricular Rate:    PR Interval:    QRS Duration:    QT Interval:    QTC Calculation:   R Axis:      Text Interpretation:         Labs Reviewed  CBC WITH DIFFERENTIAL/PLATELET - Abnormal; Notable for the following components:      Result Value   RBC 3.29 (*)    Hemoglobin 10.9 (*)    HCT 33.0 (*)    MCV 100.3 (*)    Platelets 130 (*)    Eosinophils Absolute 0.7 (*)    All other components within normal limits  BASIC METABOLIC PANEL - Abnormal;  Notable for the following components:   Glucose, Bld 129 (*)    BUN 36 (*)    Creatinine, Ser 1.47 (*)    Calcium 8.7 (*)    GFR, Estimated 45 (*)    All other components within normal limits  RESP PANEL BY RT-PCR (RSV, FLU A&B, COVID)  [id number]  URINALYSIS, ROUTINE W REFLEX MICROSCOPIC  TROPONIN I (HIGH SENSITIVITY)  TROPONIN I (HIGH SENSITIVITY)    DG Hip Unilat W or Wo Pelvis 2-3 Views Right  Final Result    CT HEAD WO CONTRAST ( )  Final Result    CT Cervical Spine Wo Contrast  Final Result    DG Pelvis Portable  Final Result    DG Chest Port 1 View  Final Result      Medications  cefTRIAXone (ROCEPHIN) 1 g in sodium chloride 0.9 % 100 mL IVPB (has no administration in time range)  azithromycin (ZITHROMAX) 500 mg in dextrose 5 % 250 mL IVPB (has no administration in time range)     Procedures  /  Critical Care Procedures  ED Course and Medical Decision Making  I have reviewed the triage vital signs, the nursing notes, and pertinent available records from the EMR.  Social Determinants Affecting Complexity of Care: Patient has no clinically significant social determinants affecting this chief complaint..   ED Course: Clinical Course as of 12/02/22 0603  Mon Dec 02, 2022  7425 CBC with Differential(!) No leukocytosis [RB]  0549 Basic metabolic panel(!) Electrolytes are about baseline [RB]  0549 Resp panel by RT-PCR (RSV, Flu A&B, Covid) Anterior Nasal Swab Covid and flu negative, will check CXR to look for source of hypoxia. [RB]  0549 DG Chest Port 1 View Patchy opacities seen, worrisome for pneumonia [RB]  0550 DG Hip Unilat W or Wo Pelvis 2-3 Views Right Femoral neck fracture [RB]  0550 Patient with new pneumonia and femoral neck fracture.  I've attempted to reach the Hospice nurse that knows the patient, but haven't heard back from them yet.  I've failed to reach the patient's emergency contacts and the facility he came from - nobody answered the  phone.  Will start antibiotic since no MOST form is present.    [RB]  [id number] I discussed plan of care with Dr. Eudelia Bunch, who recommends waiting on Hospice to get back to [place].  If we don't hear from them in the next couple of hours to help guide care, then we will admit to medicine until multi-disciplinary team can assess (medicine, palliative, ortho). [RB]  0559 I spoke with the Hospice triage nurse.  No HPOA immediately available and emergency contacts have thus far been unreachable.    Hospice will send their Hospital Liaison team to assist in goals of care this morning.    Patient will be signed out to oncoming team at shift change.  Nursing teams and charge nurse made aware of the plan as well. [RB]    Clinical Course User Index [RB] Roxy Horseman, PA-C    Medical Decision Making Patient here after a fall at nursing home yesterday.  Reportedly had imaging onsite that showed fracture.    Patient has hx of dementia.    Labs and imaging discussed below.  Problems Addressed: Closed fracture of neck of right femur, initial encounter [facility]): acute illness or injury Pneumonia of both lungs due to infectious organism, unspecified part of lung: acute illness or injury  Amount and/or Complexity of Data Reviewed Labs: ordered. Decision-making details documented in ED Course. Radiology: ordered and independent interpretation performed. Decision-making details documented in ED Course.         Consultants:    Treatment and Plan: Dispo pending.    [facility] Liaison team to come see the patient between  ~8a-9a May need further attempts to reach emergency contacts in AM if no POA found with hospital liaison team.    Final Clinical Impressions(s) / ED Diagnoses     ICD-10-CM   1. Pneumonia of both lungs due to infectious organism, unspecified part of lung  J18.9     2. Closed fracture of neck of right femur, initial encounter [facility])  S72.001A       ED Discharge Orders      None         Discharge Instructions Discussed with and Provided to Patient:   Discharge Instructions   None      Roxy Horseman, PA-C 12/02/22 [id number]    Nira Conn, MD 12/02/22 [phone]

## 2022-12-02 NOTE — Plan of Care (Signed)
Orthopedic Plan of Care Note  Patient with operative hip fracture presented to [facility] ER.  [facility] operating room cannot perform any cases after 5 PM except for life and death, so recommend transfer to [facility] for operative management.  Will plan for operative fixation tomorrow evening.  Can have diet today.  Admit to hospitalist service.  N.p.o. at midnight.  Nonweightbearing right lower extremity.  Will use ASA 81mg  BID post-operatively for DVT ppx. If any questions about the plan, please contact me.  London Sheer, MD Orthopedic Surgeon

## 2022-12-02 NOTE — ED Notes (Signed)
Report called to [facility]. Sales executive at [employer]

## 2022-12-02 NOTE — Plan of Care (Signed)
  Problem: Clinical Measurements: Goal: Will remain free from infection Outcome: Progressing Goal: Diagnostic test results will improve Outcome: Progressing Goal: Respiratory complications will improve Outcome: Progressing Goal: Cardiovascular complication will be avoided Outcome: Progressing   Problem: Nutrition: Goal: Adequate nutrition will be maintained Outcome: Progressing   Problem: Coping: Goal: Level of anxiety will decrease Outcome: Progressing   Problem: Elimination: Goal: Will not experience complications related to bowel motility Outcome: Progressing Goal: Will not experience complications related to urinary retention Outcome: Progressing   Problem: Pain Management: Goal: General experience of comfort will improve Outcome: Progressing   Problem: Safety: Goal: Ability to remain free from injury will improve Outcome: Progressing   Problem: Skin Integrity: Goal: Risk for impaired skin integrity will decrease Outcome: Progressing

## 2022-12-02 NOTE — ED Notes (Signed)
[facility] called to transport patient to [facility]

## 2022-12-02 NOTE — Progress Notes (Signed)
[facility] 5N06- [facility] Hospitalized [facility] Patient  Shawn Sanchez is a current [facility] patient at [facility] with a terminal diagnosis of other signs and symptoms of cognitive dysfunction following CVD. Patient had a fall at his facility and x-ray was obtained which found him to have right hip fracture. He began experiencing worsening pain during the night and was sent to ED for evaluation where he was found to have confirmed fractured hip and bilateral Pneumonia. He was admitted 11.11 for treatment of same. Per Dr. Patric Dykes, [facility] MD this is a related hospital admission.   Visited with patient in [facility] ED prior to his transfer to [facility]. He was able to wake up and answer some questions appropriately answering "yes" to was he in pain but most other questions he did not attempt to answer. Phone call placed to patient's friend [name] who relayed that he was patient's friend but not in a place to make health care decisions for him. He reported that he thought that patient would want whatever done to improve his quality of life.  Patient is inpatient appropriate due to need for potential surgical fixation of right hip fracture and IVAB.   Vital Signs: 98.3/78/18       120/65     spO2 98% on 2L nasal cannula I/O: 350/not recorded Abnormal labs: BUN 36, Creatinine 1.47, Ca+ 8.7, GFR 45, RBC 3.29, Hgb 10.9, Hct 33, Platelets 130 Diagnostics:  PORTABLE CHEST 1 VIEW  IMPRESSION: 1. The appearance the chest is concerning for bronchitis and developing multilobar bronchopneumonia, most severe in the left mid to lower lung. Followup PA and lateral chest X-ray is recommended in 3-4 weeks following trial of antibiotic therapy to ensure resolution and exclude underlying malignancy. Electronically Signed   By: Trudie Reed M.D.   On: 12/02/2022 04:19  CT OF THE RIGHT HIP WITHOUT CONTRAST IMPRESSION: 1. Acute impacted and angulated right intertrochanteric femur fracture with subtrochanteric  extension.     Electronically Signed   By: Obie Dredge M.D.   On: 12/02/2022 15:12  IV/PRN Meds: Zithromax 500mg  IV x1, Ancef 2g IV x1, Rocephin 1g IV x1  PROBLEM LIST as per H&P 11.11, Dr. Teena Dunk Right femoral neck fracture-due to mechanical fall at his facility yesterday, though further details are not available.  Head CT unremarkable. -Inpatient admission to [facility] -Pain and nausea control as needed -ER provider has discussed with orthopedic surgery Dr. Christell Constant -Will keep n.p.o. for now as may potentially have surgical repair tonight   Hypoxic respiratory failure-on supplemental oxygen, likely due to bilateral pneumonia as noted below   Bilateral pneumonia-patient denies any recent fevers, or cough.  However he may not be the best historian.  No leukocytosis, fever, not meeting sepsis criteria.  Chest x-ray with developing multilobar pneumonia. -Empiric IV azithromycin, and IV Rocephin   CKD stage III-appears to be at baseline   Anemia-chronic, appears near his most recent baseline   Thrombocytopenia-mild, will monitor with daily labs  Discharge Planning: Ongoing, pending progression Family contact:  Talked with friend [name] by phone IDT: Updated   Goals of Care: DNR  Please don't hesitate to reach out for any hospice related question or concerns. Thea Gist, BSN RN Hospice hospital liaison [phone]

## 2022-12-02 NOTE — ED Triage Notes (Signed)
Pt. BIB [facility] from [facility] for hip pain from a fall yesterday. Pt. Had an xray done yesterday where the fracture was confirmed to the right hip. Pt. Is a hospice patient with DNR at bedside.

## 2022-12-02 NOTE — Telephone Encounter (Signed)
[facility] called triage for Dr.Moore. Distal femur fracture. Call [facility] #[phone]

## 2022-12-02 NOTE — ED Triage Notes (Signed)
Pt presents via EMS from [facility]. EMS reports fall yesterday. EMS reports xray completed at facility confirming right hip fracture. EMS reports facility unable to give detailed hx of fall yesterday. Pt has DNR paperwork in hand. EMS reports pt on hospice.

## 2022-12-02 NOTE — ED Provider Notes (Signed)
Received pt at sign out from previous provider (see his note)  Patient presents to emergency department with complaint of fall at SNF yesterday.  Imaging onsite showed closed fracture of right neck of femur and pneumonia of both lungs.  He received Rocephin and azithromycin in ED for pneumonia.  He is currently on hospice with DNR - comfort and awaiting liaison team to direct goals of care time of treatment and admission to hospital versus comfort care.  Previous provider called all emergency contacts without response.  [facility] hospice nurse was contacted regarding patient's hospice status and treatment and reported that a hospice nurse would evaluate him and determine care plan in the morning.  Maryan Puls (friend - emergency contact) contacted by Thea Gist hospice RN reported that he said "I believe patient would do anything to improve his quality of life" and supported full continuum of care  Consulted ortho surgery and spoke to Dr. Willia Craze and he recommends admission to [facility] with surgery "possibly tonight but likely tomorrow"  Dr. Kirby Crigler (hospitalist) accepts him for admission to [facility] 12/02/22 1202    Nira Conn, MD 12/02/22 1818

## 2022-12-02 NOTE — H&P (Signed)
History and Physical  Shawn Sanchez:[id number] DOB: [birth year] DOA: 12/02/2022  PCP: Eloisa Northern, MD   Chief Complaint: Fall at facility  HPI: Shawn Sanchez is a 89 y.o. male with medical history significant for GERD, hypothyroidism, TIA, hypertension being admitted to the hospital with mechanical fall at his facility yesterday, and found to have right femoral neck fracture as well as bilateral pneumonia.  Patient is a very limited historian, denies any recent illness.  Does not recall falling yesterday cannot tell me anything about it.  He tells me that he has some right hip pain.  Review of Systems: Please see HPI for pertinent positives and negatives. A complete 10 system review of systems could not be performed due to the patient's condition.  Past Medical History:  Diagnosis Date   Anemia, unspecified    Arthritis    likely rheumatoid   Carpal tunnel syndrome    Coronary atherosclerosis of unspecified type of vessel, native or graft    Dementia (HCC)    Depression    Esophageal reflux    Hemorrhage of rectum and anus    Hernia, inguinal    History of bronchitis    History of hiatal hernia    left inguinal    Hyperchylomicronemia    Hypothyroidism    Neuropathy of both feet    Pneumonia    in the past   Shortness of breath dyspnea    with exertion   TIA (transient ischemic attack)    Unspecified disorder of kidney and ureter    Unspecified essential hypertension    Past Surgical History:  Procedure Laterality Date   angioplasy/stent  2006   carpal tunnel release Bilateral    COLONOSCOPY     CORONARY ANGIOPLASTY     2 stents placed in 2006   INGUINAL HERNIA REPAIR Left 02/06/2016   Procedure: REPAIR LEFT INGUINAL HERNIA  ;  Surgeon: Darnell Level, MD;  Location: [facility] ORS;  Service: General;  Laterality: Left;   INSERTION OF MESH Left 02/06/2016   Procedure: INSERTION OF MESH;  Surgeon: Darnell Level, MD;  Location: [facility] ORS;  Service: General;  Laterality: Left;    lamenectomy  1965   LUMBAR LAMINECTOMY/DECOMPRESSION MICRODISCECTOMY N/A 09/27/2015   Procedure: Laminectomy and Foraminotomy - Lumbar three-four,  Lumbar four-five;  Surgeon: Tia Alert, MD;  Location: [facility];  Service: Neurosurgery;  Laterality: N/A;   UPPER GASTROINTESTINAL ENDOSCOPY      Social History:  reports that he quit smoking about 23 years ago. His smoking use included pipe and cigars. He has never used smokeless tobacco. He reports current alcohol use. He reports that he does not use drugs.   Allergies  Allergen Reactions   Epinephrine Other (See Comments)    WITH NOVOCAIN = CHEST PAIN   Procaine Other (See Comments)    WITH EPINEPHRINE CHEST PAIN Other reaction(s): Other WITH EPINEPHRINE CHEST PAIN   Aricept [Donepezil]     Other reaction(s): Unknown   Levonordefrin [Nordefrin]     Other reaction(s): Unknown   Mepivacaine     Other reaction(s): Unknown   Other Hives and Other (See Comments)    Isocaine = "Mepivacaine"    Family History  Problem Relation Age of Onset   Breast cancer Mother    Lung cancer Mother    Diabetes Sister    Lung cancer Father    Coronary artery disease Father    Hypertension Father    Colon cancer Neg Hx  Prior to Admission medications   Medication Sig Start Date End Date Taking? Authorizing Provider  cholecalciferol (VITAMIN D) 1000 UNITS tablet Take 5,000 Units by mouth daily. Reported on 07/01/2015    [provider]  clopidogrel (PLAVIX) 75 MG tablet Take 75 mg by mouth at bedtime. Patient not taking since 02/02/2016 09/03/13   [provider]  levothyroxine (SYNTHROID, LEVOTHROID) 50 MCG tablet Take 50 mcg by mouth daily before breakfast.    [provider]  Melatonin 5 MG CAPS Take 10 mg by mouth at bedtime as needed (sleep). Reported on 07/01/2015    [provider]  omeprazole (PRILOSEC) 20 MG capsule Take 20 mg by mouth every other day.    [provider]  simvastatin  (ZOCOR) 20 MG tablet TAKE 1 TABLET(20 MG) BY MOUTH DAILY Patient taking differently: Take 20 mg by mouth daily. 02/28/20   Lewayne Bunting, MD  Tamsulosin HCl (FLOMAX) 0.4 MG CAPS Take 0.4 mg by mouth daily.  07/20/11   [provider]    Physical Exam: BP 107/68   Pulse 85   Temp 98.5 F (36.9 C) (Rectal)   Resp (!) 21   SpO2 97%   General:  Alert, oriented to self only, calm, in no acute distress.  Thin elderly male appearing his stated age. Cardiovascular: RRR, no murmurs or rubs, no peripheral edema  Respiratory: clear to auscultation bilaterally, no wheezes, no crackles  Abdomen: soft, nontender, nondistended, normal bowel tones heard  Skin: dry, no rashes  Musculoskeletal: no joint effusions, tenderness in the right hip Psychiatric: appropriate affect, normal speech  Neurologic: extraocular muscles intact, clear speech, moving all extremities with intact sensorium         Labs on Admission:  Basic Metabolic Panel: Recent Labs  Lab 12/02/22 0327  NA 136  K 4.2  CL 102  CO2 25  GLUCOSE 129*  BUN 36*  CREATININE 1.47*  CALCIUM 8.7*   Liver Function Tests: No results for input(s): "AST", "ALT", "ALKPHOS", "BILITOT", "PROT", "ALBUMIN" in the last 168 hours. No results for input(s): "LIPASE", "AMYLASE" in the last 168 hours. No results for input(s): "AMMONIA" in the last 168 hours. CBC: Recent Labs  Lab 12/02/22 0327  WBC 9.7  NEUTROABS 6.5  HGB 10.9*  HCT 33.0*  MCV 100.3*  PLT 130*   Cardiac Enzymes: No results for input(s): "CKTOTAL", "CKMB", "CKMBINDEX", "TROPONINI" in the last 168 hours.  BNP (last 3 results) No results for input(s): "BNP" in the last 8760 hours.  ProBNP (last 3 results) No results for input(s): "PROBNP" in the last 8760 hours.  CBG: No results for input(s): "GLUCAP" in the last 168 hours.  Radiological Exams on Admission: DG Hip Unilat W or Wo Pelvis 2-3 Views Right  Result Date: 12/02/2022 CLINICAL DATA:  89 year old  male status post fall with pain. EXAM: DG HIP (WITH OR WITHOUT PELVIS) 2-3V RIGHT COMPARISON:  Right hip series 04/28/2015. FINDINGS: Three views at 0454 hours demonstrate acute proximal right femur fracture. This appears to be a distal neck fracture also involving the intertrochanteric segment. There is varus impaction. The right femoral head appears to remain normally located at the acetabulum. No separate AP view of the pelvis, no fracture identified in the right hemipelvis. Proximal left femur not included. Calcified right femoral artery atherosclerosis. IMPRESSION: Positive for acute proximal right femur fracture, distal neck AND intertrochanteric segment affected with varus impaction. Electronically Signed   By: Odessa Fleming M.D.   On: 12/02/2022 05:06  CT HEAD WO CONTRAST ( )  Result Date: 12/02/2022 CLINICAL DATA:  Head and neck trauma, minor.  Fall. EXAM: CT HEAD WITHOUT CONTRAST CT CERVICAL SPINE WITHOUT CONTRAST TECHNIQUE: Multidetector CT imaging of the head and cervical spine was performed following the standard protocol without intravenous contrast. Multiplanar CT image reconstructions of the cervical spine were also generated. RADIATION DOSE REDUCTION: This exam was performed according to the departmental dose-optimization program which includes automated exposure control, adjustment of the mA and/or kV according to patient size and/or use of iterative reconstruction technique. COMPARISON:  01/31/2021. FINDINGS: CT HEAD FINDINGS Brain: No acute intracranial hemorrhage, midline shift or mass effect. No extra-axial fluid collection. Diffuse atrophy is noted. Periventricular white matter hypodensities are present bilaterally. No hydrocephalus. Vascular: No hyperdense vessel or unexpected calcification. Skull: Normal. Negative for fracture or focal lesion. Sinuses/Orbits: No acute finding. Other: None. CT CERVICAL SPINE FINDINGS Alignment: Normal. Skull base and vertebrae: No acute fracture. No primary  bone lesion or focal pathologic process. Soft tissues and spinal canal: No prevertebral fluid or swelling. No visible canal hematoma. Disc levels: Multilevel degenerative disc disease, degenerative endplate changes, and facet arthropathy. Upper chest: No acute abnormality. Other: Mucosal thickening is present in the maxillary sinuses and ethmoid air cells. IMPRESSION: 1. No acute intracranial process. 2. Atrophy with chronic microvascular ischemic changes. 3. Multilevel degenerative changes in the cervical spine without evidence of acute fracture. Electronically Signed   By: Thornell Sartorius M.D.   On: 12/02/2022 04:31   CT Cervical Spine Wo Contrast  Result Date: 12/02/2022 CLINICAL DATA:  Head and neck trauma, minor.  Fall. EXAM: CT HEAD WITHOUT CONTRAST CT CERVICAL SPINE WITHOUT CONTRAST TECHNIQUE: Multidetector CT imaging of the head and cervical spine was performed following the standard protocol without intravenous contrast. Multiplanar CT image reconstructions of the cervical spine were also generated. RADIATION DOSE REDUCTION: This exam was performed according to the departmental dose-optimization program which includes automated exposure control, adjustment of the mA and/or kV according to patient size and/or use of iterative reconstruction technique. COMPARISON:  01/31/2021. FINDINGS: CT HEAD FINDINGS Brain: No acute intracranial hemorrhage, midline shift or mass effect. No extra-axial fluid collection. Diffuse atrophy is noted. Periventricular white matter hypodensities are present bilaterally. No hydrocephalus. Vascular: No hyperdense vessel or unexpected calcification. Skull: Normal. Negative for fracture or focal lesion. Sinuses/Orbits: No acute finding. Other: None. CT CERVICAL SPINE FINDINGS Alignment: Normal. Skull base and vertebrae: No acute fracture. No primary bone lesion or focal pathologic process. Soft tissues and spinal canal: No prevertebral fluid or swelling. No visible canal hematoma.  Disc levels: Multilevel degenerative disc disease, degenerative endplate changes, and facet arthropathy. Upper chest: No acute abnormality. Other: Mucosal thickening is present in the maxillary sinuses and ethmoid air cells. IMPRESSION: 1. No acute intracranial process. 2. Atrophy with chronic microvascular ischemic changes. 3. Multilevel degenerative changes in the cervical spine without evidence of acute fracture. Electronically Signed   By: Thornell Sartorius M.D.   On: 12/02/2022 04:31   DG Pelvis Portable  Result Date: 12/02/2022 CLINICAL DATA:  Fall. EXAM: PORTABLE PELVIS 1-2 VIEWS COMPARISON:  None Available. FINDINGS: Examination is limited due to patient rotation. There shortening of the right femoral neck which may be projectional. No dislocation is seen. Evaluation of the sacrum is limited due to overlying bowel contents. Degenerative changes are noted in the lower lumbar spine and bilateral hips. Vascular calcifications are noted in the lower extremities bilaterally. IMPRESSION: Shortening of the right femoral neck, which may be projectional versus underlying  fracture. Examination is limited due to patient rotation. Dedicated imaging of the right hip is recommended. Electronically Signed   By: Thornell Sartorius M.D.   On: 12/02/2022 04:21   DG Chest Port 1 View  Result Date: 12/02/2022 CLINICAL DATA:  89 year old male with history of shortness of breath and fall. EXAM: PORTABLE CHEST 1 VIEW COMPARISON:  Chest x-ray 01/31/2021. FINDINGS: Lung volumes are low. Widespread interstitial prominence, peribronchial cuffing and patchy ill-defined opacities are scattered throughout the lungs bilaterally (left-greater-than-right), most evident throughout the left mid to lower lung. No definite pleural effusions. No pneumothorax. No evidence of pulmonary edema. Heart size is normal. The patient is rotated to the left on today's exam, resulting in distortion of the mediastinal contours and reduced diagnostic  sensitivity and specificity for mediastinal pathology. Old healed fracture of the left proximal humerus with posttraumatic deformity partially imaged. IMPRESSION: 1. The appearance the chest is concerning for bronchitis and developing multilobar bronchopneumonia, most severe in the left mid to lower lung. Followup PA and lateral chest X-ray is recommended in 3-4 weeks following trial of antibiotic therapy to ensure resolution and exclude underlying malignancy. Electronically Signed   By: Trudie Reed M.D.   On: 12/02/2022 04:19    Assessment/Plan Lochlyn Pallett IONGEXBM is a 89 y.o. male with medical history significant for GERD, hypothyroidism, TIA, hypertension being admitted to the hospital with mechanical fall at his facility yesterday, and found to have right femoral neck fracture as well as bilateral pneumonia.  Right femoral neck fracture-due to mechanical fall at his facility yesterday, though further details are not available.  Head CT unremarkable. -Inpatient admission to [facility] -Pain and nausea control as needed -ER provider has discussed with orthopedic surgery Dr. Christell Constant -Will keep n.p.o. for now as may potentially have surgical repair tonight  Hypoxic respiratory failure-on supplemental oxygen, likely due to bilateral pneumonia as noted below  Bilateral pneumonia-patient denies any recent fevers, or cough.  However he may not be the best historian.  No leukocytosis, fever, not meeting sepsis criteria.  Chest x-ray with developing multilobar pneumonia. -Empiric IV azithromycin, and IV Rocephin  CKD stage III-appears to be at baseline  Anemia-chronic, appears near his most recent baseline  Thrombocytopenia-mild, will monitor with daily labs  DVT prophylaxis: Lovenox     Code Status: Do not attempt resuscitation (DNR) - Comfort care  Consults called: Orthopedic surgery Dr. Christell Constant  Admission status: The appropriate patient status for this patient is INPATIENT. Inpatient status is  judged to be reasonable and necessary in order to provide the required intensity of service to ensure the patient's safety. The patient's presenting symptoms, physical exam findings, and initial radiographic and laboratory data in the context of their chronic comorbidities is felt to place them at high risk for further clinical deterioration. Furthermore, it is not anticipated that the patient will be medically stable for discharge from the hospital within 2 midnights of admission.    I certify that at the point of admission it is my clinical judgment that the patient will require inpatient hospital care spanning beyond 2 midnights from the point of admission due to high intensity of service, high risk for further deterioration and high frequency of surveillance required  Time spent: 59 minutes  Abdulraheem Pineo Sharlette Dense MD [facility] Pager [phone]  If 7PM-7AM, please contact night-coverage [email or web address] Password [facility]  12/02/2022, 12:06 PM

## 2022-12-03 DIAGNOSIS — S72001A Fracture of unspecified part of neck of right femur, initial encounter for closed fracture: Secondary | ICD-10-CM | POA: Diagnosis not present

## 2022-12-03 DIAGNOSIS — Z7189 Other specified counseling: Secondary | ICD-10-CM

## 2022-12-03 DIAGNOSIS — Z515 Encounter for palliative care: Secondary | ICD-10-CM

## 2022-12-03 LAB — CBC
HCT: 31 % — ABNORMAL LOW (ref 39.0–52.0)
Hemoglobin: 10.6 g/dL — ABNORMAL LOW (ref 13.0–17.0)
MCH: 33.2 pg (ref 26.0–34.0)
MCHC: 34.2 g/dL (ref 30.0–36.0)
MCV: 97.2 fL (ref 80.0–100.0)
Platelets: 152 10*3/uL (ref 150–400)
RBC: 3.19 MIL/uL — ABNORMAL LOW (ref 4.22–5.81)
RDW: 13.4 % (ref 11.5–15.5)
WBC: 8.5 10*3/uL (ref 4.0–10.5)
nRBC: 0 % (ref 0.0–0.2)

## 2022-12-03 LAB — BASIC METABOLIC PANEL
Anion gap: 11 (ref 5–15)
BUN: 38 mg/dL — ABNORMAL HIGH (ref 8–23)
CO2: 24 mmol/L (ref 22–32)
Calcium: 9 mg/dL (ref 8.9–10.3)
Chloride: 105 mmol/L (ref 98–111)
Creatinine, Ser: 1.46 mg/dL — ABNORMAL HIGH (ref 0.61–1.24)
GFR, Estimated: 46 mL/min — ABNORMAL LOW (ref >60)
Glucose, Bld: 119 mg/dL — ABNORMAL HIGH (ref 70–99)
Potassium: 4.2 mmol/L (ref 3.5–5.1)
Sodium: 140 mmol/L (ref 135–145)

## 2022-12-03 SURGERY — INSERTION, INTRAMEDULLARY ROD, FEMUR
Anesthesia: Choice | Laterality: Right

## 2022-12-03 MED ORDER — MORPHINE 100MG IN NS 100ML (1MG/ML) PREMIX INFUSION
1.0000 mg/h | INTRAVENOUS | Status: DC
  Administered 2022-12-03: 1 mg/h via INTRAVENOUS
  Filled 2022-12-03: qty 100

## 2022-12-03 MED ORDER — BIOTENE DRY MOUTH MT LIQD: 15.0000 mL | OROMUCOSAL | Status: DC | PRN

## 2022-12-03 MED ORDER — GLYCOPYRROLATE 1 MG PO TABS: 1.0000 mg | ORAL_TABLET | ORAL | Status: DC | PRN

## 2022-12-03 MED ORDER — MORPHINE BOLUS VIA INFUSION: 2.0000 mg | INTRAVENOUS | Status: DC | PRN

## 2022-12-03 MED ORDER — DIAZEPAM 5 MG/ML IJ SOLN
5.0000 mg | Freq: Four times a day (QID) | INTRAMUSCULAR | Status: DC | PRN
  Administered 2022-12-03: 5 mg via INTRAVENOUS
  Filled 2022-12-03: qty 2

## 2022-12-03 MED ORDER — POLYVINYL ALCOHOL 1.4 % OP SOLN: 1.0000 [drp] | Freq: Four times a day (QID) | OPHTHALMIC | Status: DC | PRN

## 2022-12-03 MED ORDER — GLYCOPYRROLATE 0.2 MG/ML IJ SOLN: 0.2000 mg | INTRAMUSCULAR | Status: DC | PRN

## 2022-12-03 NOTE — Consult Note (Signed)
Palliative Medicine Inpatient Consult Note  Consulting Provider: London Sheer, MD   Reason for consult:   Palliative Care Consult Services Palliative Medicine Consult  Reason for Consult? goals of care, possible non-op treatemtn for hip fracutre   12/03/2022  HPI:  Per intake H&P --> Shawn Sanchez is a 89 y.o. male with medical history significant for GERD, hypothyroidism, TIA, hypertension being admitted to the hospital with mechanical fall at his facility yesterday, and found to have right femoral neck fracture as well as bilateral pneumonia.  Patient is a very limited historian, denies any recent illness.  Does not recall falling yesterday cannot tell me anything about it.  He tells me that he has some right hip pain.   Palliative care involved to support goals of care decisions in the absence of a surrogate decision maker.   Clinical Assessment/Goals of Care:  *Please note that this is a verbal dictation therefore any spelling or grammatical errors are due to the "[employer] One" system interpretation.  I have reviewed medical records including EPIC notes, labs and imaging, received report from bedside RN, assessed the patient who is lying in bed, not responsive verbally though eyes are open.    I called patients friend, Shawn Sanchez who patient had been friends with since the 1990's to further discuss diagnosis prognosis, GOC, EOL wishes, disposition and options.   I introduced Palliative Medicine as specialized medical care for people living with serious illness. It focuses on providing relief from the symptoms and stress of a serious illness. The goal is to improve quality of life for both the patient and the family.  Medical History Review and Understanding:  A review of Shawn Sanchez's past medical history inclusive of GERD, hypothyroidism, TIA, HTN, and falls was held.  Social History:  Shawn Sanchez lives at [facility] where he has been for the past two years. He was married  though is a widower. He had a daughter who sadly died of drug addiction. He is a former marine. He dedicated a lot of his time to the [employer]. 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 He is a man of strong Protestant faith.   Functional and Nutritional State:  Patient was noted to be spending more time in his bed. He had increased episodes of sleepiness and a decline in his appetite per his friend. He was able to walk with a walker though he had over the past week or so had sporadic episodes of "getting up" in the night and falling.   Advance Directives:  A detailed discussion was had today regarding advanced directives.  Apparently patient had advanced directives at one time though they names his daughter who is deceased as his Management consultant. [employer] had attempted to get a revision about one year ago but the patient refused to sign the documents therefore was left without a surrogate decision maker. Per patients friend his dementia has become more marked by this point.  Shawn Sanchez is not at this time will to be a primary Management consultant.   Code Status:  Concepts specific to code status, artifical feeding and hydration, continued IV antibiotics and rehospitalization was had.  The difference between a aggressive medical intervention path  and a palliative comfort care path for this patient at this time was had.   Patient is an established DNAR/DNI code status.  Discussion:  I shared with Shawn Sanchez patient acute on present health conditions. He is aware that the patient has a right  hip fracture. He is aware that the patient has a bilateral pneumonia. He knows and understands patients outlook is poor. I shared in the absence of a decision maker and Shawn Sanchez not desiring to make decisions for the patient that further determinations in regards to surgery versus comfort emphasis will be deferred to two physicians.   I shared with Shawn Sanchez that I had spoken to patients hospitalist, Dr.  Frederick Sanchez and our Palliative Care medical director, Shawn Sanchez. I shared the determination at this time is that the patient have comfort, dignity, and quality at the end of life. We reviewed that patient would transition to inpatient hospice from here.   Decision Maker: None to speak of  SUMMARY OF RECOMMENDATIONS   DNAR/DNI  In the absence of a surrogate decision maker Shawn Sanchez and Shawn Sanchez plan to focus on comfort as opposed to aggressive medical interventions for Shawn Sanchez  Comfort care  Will start morphine gtt for symptom burden in the setting of fracture  Additional comfort medications per Shawn Sanchez Place referral made  Ongoing PMT support  Code Status/Advance Care Planning: DNAR/DNI   Palliative Prophylaxis:  Aspiration, Bowel Regimen, Delirium Protocol, Frequent Pain Assessment, Oral Care, Palliative Wound Care, and Turn Reposition  Additional Recommendations (Limitations, Scope, Preferences): Continue current comfort oriented care  Psycho-social/Spiritual:  Desire for further Chaplaincy support: Patient is protestant Additional Recommendations:    Prognosis: Limited to days.  Discharge Planning: Plan for inpatient hospice.  Vitals:   12/03/22 0520 12/03/22 0818  BP: (!) 122/50 104/68  Pulse: 81   Resp: 18 18  Temp: 99 F (37.2 C) 98.2 F (36.8 C)  SpO2: 98% 93%    Intake/Output Summary (Last 24 hours) at 12/03/2022 1146 Last data filed at 12/03/2022 0900 Gross per 24 hour  Intake 468.86 ml  Output 225 ml  Net 243.86 ml   Gen:  Frail elderly Caucasian M HEENT: Dry mucous membranes CV: Regular rate and rhythm  PULM: On 2LPM [place], breathing is even and nonlabored ABD: soft/nontender  EXT: No edema  Neuro: Not responsive  PPS: 10%   This conversation/these recommendations were discussed with patient primary care team, Shawn Sanchez   Total Time: 30 Billing based on MDM: High Problems Addressed: One acute or chronic illness or  injury that poses a threat to life or bodily function Amount and/or Complexity of Data: Category 3:Discussion of management or test interpretation with external physician/other qualified health care professional/appropriate source (not separately reported) Risks: Parenteral controlled substances and Decision not to resuscitate or to de-escalate care because of poor prognosis ______________________________________________________ [facility] Palliative Medicine Team Team Cell Phone: [phone] Please utilize secure chat with additional questions, if there is no response within 30 minutes please call the above phone number  Palliative Medicine Team providers are available by phone from 7am to 7pm daily and can be reached through the team cell phone.  Should this patient require assistance outside of these hours, please call the patient's attending physician.

## 2022-12-03 NOTE — Discharge Summary (Signed)
Physician Discharge Summary   Shawn BOCKENSTEDT QMV:[id number] DOB: May 20, 1931 DOA: 12/02/2022  PCP: Eloisa Northern, MD  Admit date: 12/02/2022 Discharge date: 12/03/2022  Admitted From: [facility] Disposition:  [facility] Discharging physician: Lewie Chamber, MD Barriers to discharge: Shawn Sanchez  Recommendations at discharge: Continue comfort care and end of life care  Discharge Condition: stable CODE STATUS: DNR Diet recommendation:  Diet Orders (From admission, onward)     Start     Ordered   12/03/22 1047  Diet NPO time specified Except for: [employer], Sips with Meds  Diet effective now       Question Answer Comment  Except for Ice Chips   Except for Sips with Meds      12/03/22 1046            Hospital Course: Mr. Wykle is an 89 yo male with PMH dementia, CAD, depression, hypothyroidism, TIA, HTN, GERD who presented after a mechanical fall.  He resides at [facility] and has been followed by hospice with a terminal diagnosis of cognitive impairment per notes. On workup, he was found to have a right femoral neck fracture sustained from the fall.  There was also pneumonia on CXR and he was started on antibiotics. After evaluation, he clearly has an extremely poor baseline functional status that has now further worsened after his fall.  Due to lack of decision makers and patient's inability to interact effectively, further evaluations were sought with palliative care and ethics. After further discussions, notably 2 physician consent with myself (Dr. Frederick Peers) and Dr. Phillips Odor we feel transitioning to comfort care is in patient's best interest. No surgical intervention will be pursued aggressive medical measures would be considered futile. He will be transferred to [facility] for ongoing symptom management and comfort care.    Principal Diagnosis: Closed right hip fracture [facility])  Discharge Diagnoses: Active Hospital Problems   Diagnosis Date Noted   Closed right hip fracture  (HCC) 12/02/2022    Priority: 1.   Goals of care, counseling/discussion 12/03/2022    Priority: 2.   Essential hypertension 11/25/2007   HLD (hyperlipidemia) 11/25/2007    Resolved Hospital Problems  No resolved problems to display.     Discharge Instructions     Increase activity slowly   Complete by: As directed       Allergies as of 12/03/2022       Reactions   Epinephrine Other (See Comments)   WITH NOVOCAIN = CHEST PAIN   Procaine Other (See Comments)   WITH EPINEPHRINE CHEST PAIN   Aricept [donepezil] Other (See Comments)   Unknown   Levonordefrin [nordefrin] Other (See Comments)    Unknown   Mepivacaine Other (See Comments)   Unknown   Other Hives, Other (See Comments)   Isocaine = "Mepivacaine"        Medication List     STOP taking these medications    acetaminophen 325 MG tablet Commonly known as: TYLENOL   aspirin 81 MG chewable tablet   busPIRone 5 MG tablet Commonly known as: BUSPAR   ipratropium-albuterol 0.5-2.5 (3) MG/3ML Soln Commonly known as: DUONEB   levothyroxine 50 MCG tablet Commonly known as: SYNTHROID   polyethylene glycol 17 g packet Commonly known as: MIRALAX / GLYCOLAX   sertraline 25 MG tablet Commonly known as: ZOLOFT   tamsulosin 0.4 MG Caps capsule Commonly known as: FLOMAX   UTI-Stat Liqd   Zinctral 20 % Pste Generic drug: Zinc Oxide        Allergies  Allergen  Reactions   Epinephrine Other (See Comments)    WITH NOVOCAIN = CHEST PAIN   Procaine Other (See Comments)    WITH EPINEPHRINE CHEST PAIN   Aricept [Donepezil] Other (See Comments)    Unknown   Levonordefrin [Nordefrin] Other (See Comments)     Unknown   Mepivacaine Other (See Comments)    Unknown   Other Hives and Other (See Comments)    Isocaine = "Mepivacaine"    Consultations: Palliative Care   Discharge Exam: BP 104/68 (BP Location: Right Arm)   Pulse 81   Temp 98.2 F (36.8 C) (Oral)   Resp 18   SpO2 93%  Physical  Exam Constitutional:      Comments: Chronically ill-appearing elderly gentleman laying in bed somewhat uncomfortable but nonverbal and does not follow any commands  HENT:     Head: Normocephalic and atraumatic.     Mouth/Throat:     Mouth: Mucous membranes are dry.  Eyes:     Extraocular Movements: Extraocular movements intact.  Cardiovascular:     Rate and Rhythm: Normal rate and regular rhythm.  Pulmonary:     Effort: Pulmonary effort is normal.     Breath sounds: Normal breath sounds.  Abdominal:     General: Bowel sounds are normal. There is no distension.     Palpations: Abdomen is soft.  Musculoskeletal:     Cervical back: Normal range of motion and neck supple.     Comments: Noted pain in RLE with palpation in hip  Skin:    General: Skin is warm and dry.  Neurological:     Comments: Nonverbal, noninteractive, does not follow commands      The results of significant diagnostics from this hospitalization (including imaging, microbiology, ancillary and laboratory) are listed below for reference.   Microbiology: Recent Results (from the past 240 hour(s))  Resp panel by RT-PCR (RSV, Flu A&B, Covid) Anterior Nasal Swab     Status: Shawn Sanchez   Collection Time: 12/02/22  3:29 AM   Specimen: Anterior Nasal Swab  Result Value Ref Range Status   SARS Coronavirus 2 by RT PCR NEGATIVE NEGATIVE Final    Comment: (NOTE) SARS-CoV-2 target nucleic acids are NOT DETECTED.  The SARS-CoV-2 RNA is generally detectable in upper respiratory specimens during the acute phase of infection. The lowest concentration of SARS-CoV-2 viral copies this assay can detect is 138 copies/mL. A negative result does not preclude SARS-Cov-2 infection and should not be used as the sole basis for treatment or other patient management decisions. A negative result may occur with  improper specimen collection/handling, submission of specimen other than nasopharyngeal swab, presence of viral mutation(s) within  the areas targeted by this assay, and inadequate number of viral copies(<138 copies/mL). A negative result must be combined with clinical observations, patient history, and epidemiological information. The expected result is Negative.  Fact Sheet for Patients:  BloggerCourse.com  Fact Sheet for Healthcare Providers:  SeriousBroker.it  This test is no t yet approved or cleared by the [place] FDA and  has been authorized for detection and/or diagnosis of SARS-CoV-2 by FDA under an Emergency Use Authorization (EUA). This EUA will remain  in effect (meaning this test can be used) for the duration of the COVID-19 declaration under Section 564(b)(1) of the Act, 21 U.S.C.section 360bbb-3(b)(1), unless the authorization is terminated  or revoked sooner.       Influenza A by PCR NEGATIVE NEGATIVE Final   Influenza B by PCR NEGATIVE NEGATIVE Final  Comment: (NOTE) The Xpert Xpress SARS-CoV-2/FLU/RSV plus assay is intended as an aid in the diagnosis of influenza from Nasopharyngeal swab specimens and should not be used as a sole basis for treatment. Nasal washings and aspirates are unacceptable for Xpert Xpress SARS-CoV-2/FLU/RSV testing.  Fact Sheet for Patients: BloggerCourse.com  Fact Sheet for Healthcare Providers: SeriousBroker.it  This test is not yet approved or cleared by the [place] FDA and has been authorized for detection and/or diagnosis of SARS-CoV-2 by FDA under an Emergency Use Authorization (EUA). This EUA will remain in effect (meaning this test can be used) for the duration of the COVID-19 declaration under Section 564(b)(1) of the Act, 21 U.S.C. section 360bbb-3(b)(1), unless the authorization is terminated or revoked.     Resp Syncytial Virus by PCR NEGATIVE NEGATIVE Final    Comment: (NOTE) Fact Sheet for  Patients: BloggerCourse.com  Fact Sheet for Healthcare Providers: SeriousBroker.it  This test is not yet approved or cleared by the [place] FDA and has been authorized for detection and/or diagnosis of SARS-CoV-2 by FDA under an Emergency Use Authorization (EUA). This EUA will remain in effect (meaning this test can be used) for the duration of the COVID-19 declaration under Section 564(b)(1) of the Act, 21 U.S.C. section 360bbb-3(b)(1), unless the authorization is terminated or revoked.  Performed at [facility], [address], [place], [place] [address]      Labs: BNP (last 3 results) No results for input(s): "BNP" in the last 8760 hours. Basic Metabolic Panel: Recent Labs  Lab 12/02/22 0327 12/03/22 0605  NA 136 140  K 4.2 4.2  CL 102 105  CO2 25 24  GLUCOSE 129* 119*  BUN 36* 38*  CREATININE 1.47* 1.46*  CALCIUM 8.7* 9.0   Liver Function Tests: No results for input(s): "AST", "ALT", "ALKPHOS", "BILITOT", "PROT", "ALBUMIN" in the last 168 hours. No results for input(s): "LIPASE", "AMYLASE" in the last 168 hours. No results for input(s): "AMMONIA" in the last 168 hours. CBC: Recent Labs  Lab 12/02/22 0327 12/03/22 0605  WBC 9.7 8.5  NEUTROABS 6.5  --   HGB 10.9* 10.6*  HCT 33.0* 31.0*  MCV 100.3* 97.2  PLT 130* 152   Cardiac Enzymes: No results for input(s): "CKTOTAL", "CKMB", "CKMBINDEX", "TROPONINI" in the last 168 hours. BNP: Invalid input(s): "POCBNP" CBG: No results for input(s): "GLUCAP" in the last 168 hours. D-Dimer No results for input(s): "DDIMER" in the last 72 hours. Hgb A1c No results for input(s): "HGBA1C" in the last 72 hours. Lipid Profile No results for input(s): "CHOL", "HDL", "LDLCALC", "TRIG", "CHOLHDL", "LDLDIRECT" in the last 72 hours. Thyroid function studies No results for input(s): "TSH", "T4TOTAL", "T3FREE", "THYROIDAB" in the last 72  hours.  Invalid input(s): "FREET3" Anemia work up No results for input(s): "VITAMINB12", "FOLATE", "FERRITIN", "TIBC", "IRON", "RETICCTPCT" in the last 72 hours. Urinalysis    Component Value Date/Time   COLORURINE YELLOW 12/02/2022 0926   APPEARANCEUR HAZY (A) 12/02/2022 0926   LABSPEC 1.019 12/02/2022 0926   PHURINE 7.0 12/02/2022 0926   GLUCOSEU NEGATIVE 12/02/2022 0926   HGBUR NEGATIVE 12/02/2022 0926   BILIRUBINUR NEGATIVE 12/02/2022 0926   KETONESUR NEGATIVE 12/02/2022 0926   PROTEINUR 30 (A) 12/02/2022 0926   UROBILINOGEN 0.2 06/17/2010 1608   NITRITE NEGATIVE 12/02/2022 0926   LEUKOCYTESUR NEGATIVE 12/02/2022 0926   Sepsis Labs Recent Labs  Lab 12/02/22 0327 12/03/22 0605  WBC 9.7 8.5   Microbiology Recent Results (from the past 240 hour(s))  Resp panel by RT-PCR (RSV,  Flu A&B, Covid) Anterior Nasal Swab     Status: Shawn Sanchez   Collection Time: 12/02/22  3:29 AM   Specimen: Anterior Nasal Swab  Result Value Ref Range Status   SARS Coronavirus 2 by RT PCR NEGATIVE NEGATIVE Final    Comment: (NOTE) SARS-CoV-2 target nucleic acids are NOT DETECTED.  The SARS-CoV-2 RNA is generally detectable in upper respiratory specimens during the acute phase of infection. The lowest concentration of SARS-CoV-2 viral copies this assay can detect is 138 copies/mL. A negative result does not preclude SARS-Cov-2 infection and should not be used as the sole basis for treatment or other patient management decisions. A negative result may occur with  improper specimen collection/handling, submission of specimen other than nasopharyngeal swab, presence of viral mutation(s) within the areas targeted by this assay, and inadequate number of viral copies(<138 copies/mL). A negative result must be combined with clinical observations, patient history, and epidemiological information. The expected result is Negative.  Fact Sheet for Patients:  BloggerCourse.com  Fact  Sheet for Healthcare Providers:  SeriousBroker.it  This test is no t yet approved or cleared by the [place] FDA and  has been authorized for detection and/or diagnosis of SARS-CoV-2 by FDA under an Emergency Use Authorization (EUA). This EUA will remain  in effect (meaning this test can be used) for the duration of the COVID-19 declaration under Section 564(b)(1) of the Act, 21 U.S.C.section 360bbb-3(b)(1), unless the authorization is terminated  or revoked sooner.       Influenza A by PCR NEGATIVE NEGATIVE Final   Influenza B by PCR NEGATIVE NEGATIVE Final    Comment: (NOTE) The Xpert Xpress SARS-CoV-2/FLU/RSV plus assay is intended as an aid in the diagnosis of influenza from Nasopharyngeal swab specimens and should not be used as a sole basis for treatment. Nasal washings and aspirates are unacceptable for Xpert Xpress SARS-CoV-2/FLU/RSV testing.  Fact Sheet for Patients: BloggerCourse.com  Fact Sheet for Healthcare Providers: SeriousBroker.it  This test is not yet approved or cleared by the [place] FDA and has been authorized for detection and/or diagnosis of SARS-CoV-2 by FDA under an Emergency Use Authorization (EUA). This EUA will remain in effect (meaning this test can be used) for the duration of the COVID-19 declaration under Section 564(b)(1) of the Act, 21 U.S.C. section 360bbb-3(b)(1), unless the authorization is terminated or revoked.     Resp Syncytial Virus by PCR NEGATIVE NEGATIVE Final    Comment: (NOTE) Fact Sheet for Patients: BloggerCourse.com  Fact Sheet for Healthcare Providers: SeriousBroker.it  This test is not yet approved or cleared by the [place] FDA and has been authorized for detection and/or diagnosis of SARS-CoV-2 by FDA under an Emergency Use Authorization (EUA). This EUA will remain in effect  (meaning this test can be used) for the duration of the COVID-19 declaration under Section 564(b)(1) of the Act, 21 U.S.C. section 360bbb-3(b)(1), unless the authorization is terminated or revoked.  Performed at [facility], [address], [place], [place] [address]     Procedures/Studies: DG Knee 1-2 Views Right  Result Date: 12/02/2022 CLINICAL DATA:  Fall with knee pain EXAM: RIGHT KNEE - 1-2 VIEW COMPARISON:  Hip radiograph 12/02/2022 FINDINGS: No fracture or malalignment. Chondrocalcinosis. Vascular calcifications. Moderate tricompartment arthritis of the knee IMPRESSION: No acute osseous abnormality. Moderate tricompartment arthritis. Chondrocalcinosis. Electronically Signed   By: Jasmine Pang M.D.   On: 12/02/2022 18:35   CT HIP RIGHT WO CONTRAST  Result Date: 12/02/2022 CLINICAL DATA:  Right hip after  fall yesterday. EXAM: CT OF THE RIGHT HIP WITHOUT CONTRAST TECHNIQUE: Multidetector CT imaging of the right hip was performed according to the standard protocol. Multiplanar CT image reconstructions were also generated. RADIATION DOSE REDUCTION: This exam was performed according to the departmental dose-optimization program which includes automated exposure control, adjustment of the mA and/or kV according to patient size and/or use of iterative reconstruction technique. COMPARISON:  Right hip x-rays from same day. FINDINGS: Bones/Joint/Cartilage Acute impacted right intertrochanteric femur fracture with apex anterior and varus angulation. The fracture extends into the proximal subtrochanteric femur. No additional fracture. No dislocation. Degenerative changes of the right hip with chondrocalcinosis. Osteopenia. No significant joint effusion. Ligaments Ligaments are suboptimally evaluated by CT. Muscles and Tendons Grossly intact. Soft tissue No fluid collection or hematoma. No soft tissue mass. Moderate amount of stool in the rectosigmoid colon. IMPRESSION: 1. Acute  impacted and angulated right intertrochanteric femur fracture with subtrochanteric extension. Electronically Signed   By: Obie Dredge M.D.   On: 12/02/2022 15:12   DG Hip Unilat W or Wo Pelvis 2-3 Views Right  Result Date: 12/02/2022 CLINICAL DATA:  89 year old male status post fall with pain. EXAM: DG HIP (WITH OR WITHOUT PELVIS) 2-3V RIGHT COMPARISON:  Right hip series 04/28/2015. FINDINGS: Three views at 0454 hours demonstrate acute proximal right femur fracture. This appears to be a distal neck fracture also involving the intertrochanteric segment. There is varus impaction. The right femoral head appears to remain normally located at the acetabulum. No separate AP view of the pelvis, no fracture identified in the right hemipelvis. Proximal left femur not included. Calcified right femoral artery atherosclerosis. IMPRESSION: Positive for acute proximal right femur fracture, distal neck AND intertrochanteric segment affected with varus impaction. Electronically Signed   By: Odessa Fleming M.D.   On: 12/02/2022 05:06   CT HEAD WO CONTRAST ( )  Result Date: 12/02/2022 CLINICAL DATA:  Head and neck trauma, minor.  Fall. EXAM: CT HEAD WITHOUT CONTRAST CT CERVICAL SPINE WITHOUT CONTRAST TECHNIQUE: Multidetector CT imaging of the head and cervical spine was performed following the standard protocol without intravenous contrast. Multiplanar CT image reconstructions of the cervical spine were also generated. RADIATION DOSE REDUCTION: This exam was performed according to the departmental dose-optimization program which includes automated exposure control, adjustment of the mA and/or kV according to patient size and/or use of iterative reconstruction technique. COMPARISON:  01/31/2021. FINDINGS: CT HEAD FINDINGS Brain: No acute intracranial hemorrhage, midline shift or mass effect. No extra-axial fluid collection. Diffuse atrophy is noted. Periventricular white matter hypodensities are present bilaterally. No  hydrocephalus. Vascular: No hyperdense vessel or unexpected calcification. Skull: Normal. Negative for fracture or focal lesion. Sinuses/Orbits: No acute finding. Other: Shawn Sanchez. CT CERVICAL SPINE FINDINGS Alignment: Normal. Skull base and vertebrae: No acute fracture. No primary bone lesion or focal pathologic process. Soft tissues and spinal canal: No prevertebral fluid or swelling. No visible canal hematoma. Disc levels: Multilevel degenerative disc disease, degenerative endplate changes, and facet arthropathy. Upper chest: No acute abnormality. Other: Mucosal thickening is present in the maxillary sinuses and ethmoid air cells. IMPRESSION: 1. No acute intracranial process. 2. Atrophy with chronic microvascular ischemic changes. 3. Multilevel degenerative changes in the cervical spine without evidence of acute fracture. Electronically Signed   By: Thornell Sartorius M.D.   On: 12/02/2022 04:31   CT Cervical Spine Wo Contrast  Result Date: 12/02/2022 CLINICAL DATA:  Head and neck trauma, minor.  Fall. EXAM: CT HEAD WITHOUT CONTRAST CT CERVICAL SPINE WITHOUT CONTRAST TECHNIQUE: Multidetector CT  imaging of the head and cervical spine was performed following the standard protocol without intravenous contrast. Multiplanar CT image reconstructions of the cervical spine were also generated. RADIATION DOSE REDUCTION: This exam was performed according to the departmental dose-optimization program which includes automated exposure control, adjustment of the mA and/or kV according to patient size and/or use of iterative reconstruction technique. COMPARISON:  01/31/2021. FINDINGS: CT HEAD FINDINGS Brain: No acute intracranial hemorrhage, midline shift or mass effect. No extra-axial fluid collection. Diffuse atrophy is noted. Periventricular white matter hypodensities are present bilaterally. No hydrocephalus. Vascular: No hyperdense vessel or unexpected calcification. Skull: Normal. Negative for fracture or focal lesion.  Sinuses/Orbits: No acute finding. Other: Shawn Sanchez. CT CERVICAL SPINE FINDINGS Alignment: Normal. Skull base and vertebrae: No acute fracture. No primary bone lesion or focal pathologic process. Soft tissues and spinal canal: No prevertebral fluid or swelling. No visible canal hematoma. Disc levels: Multilevel degenerative disc disease, degenerative endplate changes, and facet arthropathy. Upper chest: No acute abnormality. Other: Mucosal thickening is present in the maxillary sinuses and ethmoid air cells. IMPRESSION: 1. No acute intracranial process. 2. Atrophy with chronic microvascular ischemic changes. 3. Multilevel degenerative changes in the cervical spine without evidence of acute fracture. Electronically Signed   By: Thornell Sartorius M.D.   On: 12/02/2022 04:31   DG Pelvis Portable  Result Date: 12/02/2022 CLINICAL DATA:  Fall. EXAM: PORTABLE PELVIS 1-2 VIEWS COMPARISON:  Shawn Sanchez Available. FINDINGS: Examination is limited due to patient rotation. There shortening of the right femoral neck which may be projectional. No dislocation is seen. Evaluation of the sacrum is limited due to overlying bowel contents. Degenerative changes are noted in the lower lumbar spine and bilateral hips. Vascular calcifications are noted in the lower extremities bilaterally. IMPRESSION: Shortening of the right femoral neck, which may be projectional versus underlying fracture. Examination is limited due to patient rotation. Dedicated imaging of the right hip is recommended. Electronically Signed   By: Thornell Sartorius M.D.   On: 12/02/2022 04:21   DG Chest Port 1 View  Result Date: 12/02/2022 CLINICAL DATA:  89 year old male with history of shortness of breath and fall. EXAM: PORTABLE CHEST 1 VIEW COMPARISON:  Chest x-ray 01/31/2021. FINDINGS: Lung volumes are low. Widespread interstitial prominence, peribronchial cuffing and patchy ill-defined opacities are scattered throughout the lungs bilaterally (left-greater-than-right), most  evident throughout the left mid to lower lung. No definite pleural effusions. No pneumothorax. No evidence of pulmonary edema. Heart size is normal. The patient is rotated to the left on today's exam, resulting in distortion of the mediastinal contours and reduced diagnostic sensitivity and specificity for mediastinal pathology. Old healed fracture of the left proximal humerus with posttraumatic deformity partially imaged. IMPRESSION: 1. The appearance the chest is concerning for bronchitis and developing multilobar bronchopneumonia, most severe in the left mid to lower lung. Followup PA and lateral chest X-ray is recommended in 3-4 weeks following trial of antibiotic therapy to ensure resolution and exclude underlying malignancy. Electronically Signed   By: Trudie Reed M.D.   On: 12/02/2022 04:19     Time coordinating discharge: Over 30 minutes    Lewie Chamber, MD  [facility] 12/03/2022, 1:48 PM

## 2022-12-03 NOTE — Progress Notes (Addendum)
Orthopedic Plan of Care Note  After multi-disciplinary input, patient has been made comfort care. As a result, no plan for operative fixation at this time. Patient should be non-weight bearing on the right lower extremity. Pain control will be the goal. He can have a diet from orthopedic perspective. Please reach out if there are any questions/concerns about his fracture.   London Sheer, MD Orthopedic Surgeon

## 2022-12-03 NOTE — Hospital Course (Signed)
Shawn Sanchez is an 89 yo male with PMH dementia, CAD, depression, hypothyroidism, TIA, HTN, GERD who presented after a mechanical fall.  He resides at [facility] and has been followed by hospice with a terminal diagnosis of cognitive impairment per notes. On workup, he was found to have a right femoral neck fracture sustained from the fall.  There was also pneumonia on CXR and he was started on antibiotics. After evaluation, he clearly has an extremely poor baseline functional status that has now further worsened after his fall.  Due to lack of decision makers and patient's inability to interact effectively, further evaluations were sought with palliative care and ethics. After further discussions, notably 2 physician consent with myself (Dr. Frederick Peers) and Dr. Phillips Odor we feel transitioning to comfort care is in patient's best interest. No surgical intervention will be pursued aggressive medical measures would be considered futile. He will be transferred to [facility] for ongoing symptom management and comfort care.

## 2022-12-03 NOTE — Progress Notes (Addendum)
This nurse wasted 97mg /27ml of morphine for this patient. The morphine was in a 100mg /129ml bag, the pt was on this drip for 3 hours 1251 to 1552. The witnessing nurse was [facility], Charity fundraiser. Upon trying to waste via the pyxis under this pt the morphine option was not available to waste. This nurse called the pharmacy and was instructed to place a nursing note.

## 2022-12-03 NOTE — TOC Transition Note (Signed)
Transition of Care [facility]) - CM/SW Discharge Note   Patient Details  Name: Shawn Sanchez MRN: [id number] Date of Birth: 1931/10/18  Transition of Care [facility]) CM/SW Contact:  Lorri Frederick, LCSW Phone Number: 12/03/2022, 1:29 PM   Clinical Narrative:   Pt discharging to [facility] residential hospice.  RN call report to: [phone].     Final next level of care: Hospice Medical Facility Barriers to Discharge: Barriers Resolved   Patient Goals and CMS Choice      Discharge Placement                Patient chooses bed at:  [facility], [place]) Patient to be transferred to facility by: [facility] Name of family member notified: friend [name] was called by Michelle/palliative team Patient and family notified of of transfer: 12/03/22  Discharge Plan and Services Additional resources added to the After Visit Summary for                                       Social Determinants of Health (SDOH) Interventions SDOH Screenings   Food Insecurity: Patient Unable To Answer (12/02/2022)  Housing: High Risk (12/02/2022)  Transportation Needs: Patient Unable To Answer (12/02/2022)  Utilities: Patient Unable To Answer (12/02/2022)  Tobacco Use: Medium Risk (12/02/2022)     Readmission Risk Interventions     No data to display

## 2022-12-03 NOTE — Progress Notes (Signed)
Terri RN wasted morphine 97mg /90ml. I witnessed.

## 2022-12-22 DEATH — deceased
